# Patient Record
Sex: Female | Born: 1980 | Race: White | Hispanic: No | Smoking: Former smoker
Health system: Southern US, Community
[De-identification: ages and names within clinical notes are randomized; demographics above are authoritative.]

## PROBLEM LIST (undated history)

## (undated) ENCOUNTER — Inpatient Hospital Stay: Payer: Self-pay

## (undated) DIAGNOSIS — I10 Essential (primary) hypertension: Secondary | ICD-10-CM

## (undated) DIAGNOSIS — N200 Calculus of kidney: Secondary | ICD-10-CM

## (undated) DIAGNOSIS — I219 Acute myocardial infarction, unspecified: Secondary | ICD-10-CM

## (undated) DIAGNOSIS — I639 Cerebral infarction, unspecified: Secondary | ICD-10-CM

## (undated) DIAGNOSIS — N390 Urinary tract infection, site not specified: Secondary | ICD-10-CM

## (undated) HISTORY — PX: CARDIAC SURGERY: SHX584

## (undated) HISTORY — PX: APPENDECTOMY: SHX54

## (undated) HISTORY — PX: LITHOTRIPSY: SUR834

## (undated) SURGERY — Surgical Case
Anesthesia: Spinal | Laterality: Bilateral

---
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
## 2000-01-31 ENCOUNTER — Encounter: Payer: Self-pay | Admitting: Emergency Medicine

## 2000-01-31 ENCOUNTER — Emergency Department (HOSPITAL_COMMUNITY): Admission: EM | Admit: 2000-01-31 | Discharge: 2000-01-31 | Payer: Self-pay | Admitting: Emergency Medicine

## 2000-06-24 ENCOUNTER — Emergency Department (HOSPITAL_COMMUNITY): Admission: EM | Admit: 2000-06-24 | Discharge: 2000-06-24 | Payer: Self-pay | Admitting: Emergency Medicine

## 2000-12-04 ENCOUNTER — Emergency Department (HOSPITAL_COMMUNITY): Admission: EM | Admit: 2000-12-04 | Discharge: 2000-12-04 | Payer: Self-pay | Admitting: Emergency Medicine

## 2000-12-05 ENCOUNTER — Emergency Department (HOSPITAL_COMMUNITY): Admission: EM | Admit: 2000-12-05 | Discharge: 2000-12-05 | Payer: Self-pay | Admitting: Emergency Medicine

## 2001-11-13 ENCOUNTER — Emergency Department (HOSPITAL_COMMUNITY): Admission: EM | Admit: 2001-11-13 | Discharge: 2001-11-14 | Payer: Self-pay | Admitting: Emergency Medicine

## 2001-11-16 ENCOUNTER — Encounter: Payer: Self-pay | Admitting: Emergency Medicine

## 2001-11-16 ENCOUNTER — Inpatient Hospital Stay (HOSPITAL_COMMUNITY): Admission: EM | Admit: 2001-11-16 | Discharge: 2001-11-23 | Payer: Self-pay | Admitting: Emergency Medicine

## 2001-11-16 ENCOUNTER — Encounter (INDEPENDENT_AMBULATORY_CARE_PROVIDER_SITE_OTHER): Payer: Self-pay | Admitting: *Deleted

## 2001-11-22 ENCOUNTER — Encounter: Payer: Self-pay | Admitting: General Surgery

## 2002-01-30 ENCOUNTER — Emergency Department (HOSPITAL_COMMUNITY): Admission: EM | Admit: 2002-01-30 | Discharge: 2002-01-30 | Payer: Self-pay

## 2005-04-15 ENCOUNTER — Emergency Department: Payer: Self-pay | Admitting: Emergency Medicine

## 2005-09-29 ENCOUNTER — Emergency Department: Payer: Self-pay | Admitting: Emergency Medicine

## 2005-11-19 ENCOUNTER — Emergency Department: Payer: Self-pay | Admitting: Emergency Medicine

## 2006-03-08 ENCOUNTER — Emergency Department: Payer: Self-pay | Admitting: Emergency Medicine

## 2006-05-06 ENCOUNTER — Emergency Department: Payer: Self-pay | Admitting: Emergency Medicine

## 2006-06-17 ENCOUNTER — Emergency Department: Payer: Self-pay | Admitting: Emergency Medicine

## 2006-06-18 ENCOUNTER — Emergency Department: Payer: Self-pay | Admitting: Emergency Medicine

## 2006-06-30 ENCOUNTER — Other Ambulatory Visit: Payer: BC Managed Care – PPO

## 2006-06-30 ENCOUNTER — Emergency Department: Payer: Self-pay | Admitting: Emergency Medicine

## 2006-07-24 ENCOUNTER — Emergency Department: Payer: Self-pay | Admitting: Emergency Medicine

## 2006-09-23 ENCOUNTER — Emergency Department: Payer: Self-pay

## 2007-01-28 ENCOUNTER — Emergency Department: Payer: Self-pay | Admitting: Unknown Physician Specialty

## 2007-02-24 ENCOUNTER — Emergency Department: Payer: Self-pay | Admitting: General Practice

## 2008-01-16 ENCOUNTER — Emergency Department: Payer: Self-pay | Admitting: Emergency Medicine

## 2008-01-27 ENCOUNTER — Emergency Department (HOSPITAL_COMMUNITY): Admission: EM | Admit: 2008-01-27 | Discharge: 2008-01-27 | Payer: Self-pay | Admitting: Emergency Medicine

## 2008-07-21 ENCOUNTER — Emergency Department: Payer: Self-pay | Admitting: Emergency Medicine

## 2008-11-25 ENCOUNTER — Emergency Department: Payer: Self-pay | Admitting: Emergency Medicine

## 2008-12-08 ENCOUNTER — Emergency Department: Payer: Self-pay | Admitting: Emergency Medicine

## 2008-12-11 ENCOUNTER — Inpatient Hospital Stay: Payer: Self-pay | Admitting: Internal Medicine

## 2009-06-24 ENCOUNTER — Emergency Department: Payer: Self-pay | Admitting: Emergency Medicine

## 2009-07-30 ENCOUNTER — Emergency Department: Payer: Self-pay | Admitting: Unknown Physician Specialty

## 2009-09-16 ENCOUNTER — Emergency Department: Payer: Self-pay | Admitting: Emergency Medicine

## 2009-09-18 ENCOUNTER — Ambulatory Visit: Payer: Self-pay | Admitting: Urology

## 2009-09-22 ENCOUNTER — Emergency Department: Payer: Self-pay | Admitting: Emergency Medicine

## 2009-09-23 ENCOUNTER — Ambulatory Visit: Payer: Self-pay | Admitting: Urology

## 2009-09-24 ENCOUNTER — Ambulatory Visit: Payer: Self-pay | Admitting: Urology

## 2009-12-23 ENCOUNTER — Ambulatory Visit: Payer: Self-pay | Admitting: Urology

## 2009-12-29 ENCOUNTER — Emergency Department: Payer: Self-pay | Admitting: Emergency Medicine

## 2010-04-21 ENCOUNTER — Emergency Department: Payer: Self-pay | Admitting: Emergency Medicine

## 2010-06-29 ENCOUNTER — Emergency Department: Payer: Self-pay | Admitting: Emergency Medicine

## 2010-07-12 ENCOUNTER — Emergency Department: Payer: Self-pay | Admitting: Emergency Medicine

## 2010-10-16 ENCOUNTER — Emergency Department: Payer: Self-pay | Admitting: Emergency Medicine

## 2010-11-04 ENCOUNTER — Emergency Department: Payer: Self-pay | Admitting: Emergency Medicine

## 2011-03-09 ENCOUNTER — Emergency Department: Payer: Self-pay | Admitting: Internal Medicine

## 2011-03-13 ENCOUNTER — Emergency Department: Payer: Self-pay | Admitting: Emergency Medicine

## 2011-04-23 NOTE — H&P (Signed)
[place]. [facility]  Patient:    Brenda Todd, Brenda Todd Visit Number: [id number] MRN: [id number]          Service Type: SUR Location: [id number] Attending Physician:  Cherylynn Ridges Dictated by:   Jimmye Norman, M.D. Admit Date:  11/16/2001                           History and Physical  IDENTIFICATION AND CHIEF COMPLAINT:  The patient is a 30 year old female with a pelvic abscess likely secondary to ruptured appendix but possibly a tuboovarian abscess.  HISTORY OF PRESENT ILLNESS:  The patient has been ill since this past Sunday and actually started getting ill on Saturday evening with a generalized abdominal pain and localized to the right lower quadrant, associated with nausea.  She went for evaluation at an outside facility, where she sat in the lobby for eight hours and then went home without being seen.  She developed pain again the following day and actually stayed out of work for the entire week because of abdominal pain.  She has no primary care physician.  This evening, she came in with abdominal pain and generalized fever and white blood cell count which was elevated.  A CT scan demonstrated an abscess in the right lower quadrant consistent with acute appendicitis with rupture but also possibly secondary to a tuboovarian abscess.  Surgical consultation was obtained.  PAST MEDICAL HISTORY: She has no real past medical history.  PAST SURGICAL HISTORY:  She has had no previous surgery.  MEDICATIONS:  She takes no routine medications except for Aleve p.r.n.  ALLERGIES:  No known drug allergies.  REVIEW OF SYSTEMS:  She has had difficulty with diarrhea and constipation recently, possibly secondary to irritation.  No dysuria.  No weight loss.  PHYSICAL EXAMINATION:  VITAL SIGNS:  She is febrile to 101.8.  She is tachycardic at 105.  Blood pressure is okay.  HEENT:  She is normocephalic and atraumatic and anicteric.  NECK:   Supple.  CHEST:  Clear to auscultation.  CARDIAC:  Regular rhythm.  Rate is tachycardic at 107.  No murmurs, gallops, rubs or heaves.  ABDOMEN:  Her abdomen is tender throughout, especially in the right lower quadrant, but also diffusely tender in the left upper quadrant and left lower quadrant without a Rovsing sign.  RECTAL:  Deferred.  LABORATORY STUDIES:  Laboratory studies show an elevated white count. Hematocrit is okay.  IMPRESSION:  Likely ruptured acute appendicitis but possibly tuboovarian abscess with involvement of the periappendiceal area.  PLAN:  The plan is to take the patient to the operating room as soon as possible for a laparoscopic procedure, at which time we will be able to diagnose and treat either the ruptured appendix with abscess or the TOA. Dictated by:   Jimmye Norman, M.D. Attending Physician:  Cherylynn Ridges DD:  11/17/01 TD:  11/17/01 Job: [id number]

## 2011-04-23 NOTE — Discharge Summary (Signed)
[place]. [facility]  Patient:    Brenda Todd, Brenda Todd Visit Number: [id number] MRN: [id number]          Service Type: SUR Location: 6700 [id number] Attending Physician:  Cherylynn Ridges Dictated by:   Jimmye Norman, M.D. Admit Date:  11/16/2001 Discharge Date: 11/23/2001                             Discharge Summary  DISCHARGE DIAGNOSIS:  Ruptured appendicitis.  PRINCIPAL PROCEDURES: 1. CAT scan x2. 2. Laparoscopic appendectomy with drainage of pelvic abscess.  SURGEON:  Dr. Lindie Spruce.  DISCHARGE MEDICATIONS: 1. Ciprofloxacin 500 mg p.o. b.i.d. for five days. 2. Flagyl 250 mg p.o. t.i.d. for five days. 3. Vicodin for pain relief.  DIET:  Unrestricted.  CONDITION:  Stable.  ACTIVITY:  She has no restricted activity.  WOUND CARE:  She can shower and pat her wounds dry.  FOLLOWUP:  She will return to see me on December 05, 2001.  BRIEF SUMMARY OF HOSPITAL COURSE:  The patient came in with abdominal pain over several days.  She had gone to the emergency room to be seen for abdominal pain but over several hours had not been seen in the ED, went home, stayed there until she got sick with high fevers, rigors, and abdominal pain. She came in, where a CT scan demonstrated periappendiceal inflammation, likely periappendiceal rupture.  She was taken to surgery for a laparoscopic examination.  At the time of laparoscopy, the patient was found to have a necrotic gangrenous appendix, going down into the pelvis posterior to the right ovary. There was an abscessed cavity there.  We were able to laparoscopically remove the appendix and staple off the base.  We placed a Blake drain into the abscessed cavity on the right side after copious irrigation of that area with saline.  Once this was done, the patient was placed on IV antibiotics postoperatively including Cefotan, Flagyl, and gentamicin.  She was on once-a-day dosing of gentamicin.  Over the next four days,  she defervesced and was discharged to home on postop day #6 tolerating a regular diet well, having bowel movements, and with a CT scan demonstrating resolution of the pelvic abscess.  She will follow up to see me in a little bit over a week, maybe 12 days, and should call me for any difficulties prior to that. Dictated by:   Jimmye Norman, M.D. Attending Physician:  Cherylynn Ridges DD:  11/23/01 TD:  11/24/01 Job: [id number]

## 2011-04-23 NOTE — Op Note (Signed)
[place]. [facility]  Patient:    Brenda Todd, Brenda Todd Visit Number: [id number] MRN: [id number]          Service Type: SUR Location: 6700 [id number] Attending Physician:  Cherylynn Ridges Dictated by:   Jimmye Norman, M.D. Proc. Date: 11/17/01 Admit Date:  11/16/2001                             Operative Report  PREOPERATIVE DIAGNOSIS:  Ruptured appendix with retrocecal and right posterior pelvic abscess.  POSTOPERATIVE DIAGNOSIS:  Ruptured appendix with retrocecal and right posterior pelvic abscess.  OPERATION PERFORMED:  Laparoscopic appendectomy with drainage of pelvic abscess.  SURGEON:  Jimmye Norman, M.D.  ASSISTANT:  None  ANESTHESIA:  General endotracheal.  ESTIMATED BLOOD LOSS:  Less than 50 cc.  COMPLICATIONS:  None.  CONDITION:  Fair.  INDICATIONS FOR PROCEDURE:  The patient is a 30 year old female with a five day history of abdominal pain, fevers and leukocytosis, who now comes in for surgical treatment of her ruptured appendicitis, diagnosed by CT scan.  FINDINGS:  The patient had a large pelvic abscess, mostly in the right cul-de-sac.  This came from retrocecal and posterior appendix, which was necrotic and gangrenous and had perforated down towards the area of the abscess.  There were fecaliths associated with this perforated appendix which were removed along with the appendix itself.  DESCRIPTION OF PROCEDURE:  The patient was taken to the operating room and placed on the table in supine position.  After an adequate general anesthetic was administered, she was prepped in the usual sterile manner exposing all four quadrants of the abdomen.  A superumbilical longitudinal incision was made down to the midline fascia at ________ Veress needle was passed into to the peritoneal cavity tenting on the anterior abdominal wall with sharp towel clamps.  We confirmed position of the Veress needle using saline test and then insufflated CO2  into the peritoneal cavity up to a maximal intra-abdominal pressure of 15 mmHg.  Once this was done, a 10-11 mm cannula was passed through the superumbilical fascia into the peritoneal cavity and confirmed to be in position with a laparoscope and attached camera and light source.  Immediately upon passing the laparoscope into the peritoneal cavity through the superumbilical cannula you could see evidence of infection in the right lower quadrant.  There were loops of the terminal ileum sort of folding over towards the base of the cecum which upon lifting you could see the partially necrotic and infected appendix.  A right upper quadrant 5 mm cannula was passed under direct vision into the peritoneal cavity and then a superpubic 11-12 mm cannula without blade was passed into the peritoneal cavity.  Once we had all cannulas in place, the patient  was placed in Trendelenburg and the left side was tilted down. Initially, we bluntly dissected the ruptured appendix from the lateral posterior wall.  The wall of it was gangrenous and upon trying to grasp it there was pus extruding from its lumen.  We were able to mobilize the base of the appendix at the cecum and create a window between the mesoappendix and the base of the cecum.  However, upon trying to gently dissect it away, the appendix did avulse away from the base of the cecum.  However, there was still left a 2 to 3 cm stump.  We used this stump to control the base of the cecum as we passed  an Endo GIA through the 11-12 mm superpubic cannula into the peritoneal cavity, stapling across the base of the cecum without difficulty. This residual portion of cecum and appendix was passed off as a specimen.  We subsequently controlled the mesoappendix of the torn off appendix using a 2.5 mm Endo GIA stapler.  This allowed [place] to remove the appendix with an Endocatch bag through the superpubic cannula site without contamination of the skin.  The  rest of the case was then primarily controlling the bleeding and also breaking up the loculations of abscess cavity into the right posterior cul-de-sac.  We irrigated with close to 4.5L of saline.  There was an abscess cavity going behind the right fallopian tube and the right ovary.  Attempts to take pictures of this were made in the operating room.  However, because of a malfunction of the photographic equipment, no pictures could be taken.  The base of the cecum was inspected carefully to make sure that we had gotten the entire ____________ and this was easily noted.  The appendix epiploica or the fragile fatty tissue at the terminal ileum was used to lift up the base of the cecum, look at the mesoappendix where there was some bleeding from the staple lines.  This was controlled with electrocautery.  Once the bleeding at the mesoappendix was controlled, the base of the cecum was inspected.  We irrigated with copious amounts of saline in this posterior abscess cavity.  A 19 mm Blake was cut to the appropriate length and then passed down through the superpubic cannula into the peritoneal cavity and then brought out through the right upper quadrant incision site.  We used the second grasper to pass the [place] drain posterior to the right fallopian tube and ovary at the site of the loculated cavity.  We secured it in place with 3-0 nylon.  Once the catheter or the [facility] drain was in place, we removed our cannulas and then repaired the fascia.  The superumbilical fascia was closed using a figure-of-eight stitch of #0 Vicryl.  Once this was done, the skin was closed using running subcuticular stitch of 5-0 Vicryl.  This was done at the superumbilical and also at the pelvic site.  The drain came out the last cannula site in the right upper quadrant and had a drain dressing put in place.  Sponge, needle and instrument counts were correct.  Sterile dressings were applied. Dictated by:   Jimmye Norman, M.D. Attending Physician:  Cherylynn Ridges DD:  11/17/01 TD:  11/17/01 Job: [id number]

## 2011-08-30 LAB — URINE MICROSCOPIC-ADD ON

## 2011-08-30 LAB — URINALYSIS, ROUTINE W REFLEX MICROSCOPIC
Glucose, UA: NEGATIVE
Ketones, ur: NEGATIVE
Specific Gravity, Urine: 1.021
pH: 7.5

## 2011-08-30 LAB — BASIC METABOLIC PANEL
CO2: 27
Chloride: 104
GFR calc Af Amer: >60
Potassium: 4
Sodium: 139

## 2011-08-30 LAB — CBC
Hemoglobin: 14.6
MCHC: 34.5
MCV: 80
RBC: 5.31 — ABNORMAL HIGH
WBC: 10.7 — ABNORMAL HIGH

## 2011-08-30 LAB — DIFFERENTIAL
Basophils Relative: 1
Eosinophils Absolute: 0.2
Lymphs Abs: 2.8
Monocytes Absolute: 0.5
Monocytes Relative: 5

## 2011-09-16 ENCOUNTER — Emergency Department: Payer: Self-pay | Admitting: *Deleted

## 2012-04-05 ENCOUNTER — Encounter (HOSPITAL_COMMUNITY): Payer: Self-pay | Admitting: Emergency Medicine

## 2012-04-05 ENCOUNTER — Emergency Department (HOSPITAL_COMMUNITY): Payer: BC Managed Care – PPO

## 2012-04-05 ENCOUNTER — Emergency Department (HOSPITAL_COMMUNITY)
Admission: EM | Admit: 2012-04-05 | Discharge: 2012-04-05 | Disposition: A | Discharge status: Home / Self Care | Payer: BC Managed Care – PPO | Attending: Emergency Medicine | Admitting: Emergency Medicine

## 2012-04-05 DIAGNOSIS — R300 Dysuria: Secondary | ICD-10-CM | POA: Insufficient documentation

## 2012-04-05 DIAGNOSIS — R350 Frequency of micturition: Secondary | ICD-10-CM | POA: Insufficient documentation

## 2012-04-05 DIAGNOSIS — N10 Acute tubulo-interstitial nephritis: Secondary | ICD-10-CM | POA: Insufficient documentation

## 2012-04-05 DIAGNOSIS — R3915 Urgency of urination: Secondary | ICD-10-CM | POA: Insufficient documentation

## 2012-04-05 DIAGNOSIS — Z79899 Other long term (current) drug therapy: Secondary | ICD-10-CM | POA: Insufficient documentation

## 2012-04-05 DIAGNOSIS — R109 Unspecified abdominal pain: Secondary | ICD-10-CM | POA: Insufficient documentation

## 2012-04-05 DIAGNOSIS — R509 Fever, unspecified: Secondary | ICD-10-CM | POA: Insufficient documentation

## 2012-04-05 DIAGNOSIS — R112 Nausea with vomiting, unspecified: Secondary | ICD-10-CM | POA: Insufficient documentation

## 2012-04-05 DIAGNOSIS — M549 Dorsalgia, unspecified: Secondary | ICD-10-CM | POA: Insufficient documentation

## 2012-04-05 HISTORY — DX: Calculus of kidney: N20.0

## 2012-04-05 HISTORY — DX: Urinary tract infection, site not specified: N39.0

## 2012-04-05 LAB — COMPREHENSIVE METABOLIC PANEL
BUN: 13 mg/dL (ref 6–23)
CO2: 25 mEq/L (ref 19–32)
Calcium: 9 mg/dL (ref 8.4–10.5)
Creatinine, Ser: 0.89 mg/dL (ref 0.50–1.10)
GFR calc Af Amer: >90 mL/min (ref >90)
GFR calc non Af Amer: 85 mL/min — ABNORMAL LOW (ref >90)
Glucose, Bld: 100 mg/dL — ABNORMAL HIGH (ref 70–99)
Sodium: 138 mEq/L (ref 135–145)
Total Protein: 7.1 g/dL (ref 6.0–8.3)

## 2012-04-05 LAB — CBC
Hemoglobin: 13.8 g/dL (ref 12.0–15.0)
MCH: 27.8 pg (ref 26.0–34.0)
MCHC: 34 g/dL (ref 30.0–36.0)
MCV: 81.9 fL (ref 78.0–100.0)
Platelets: 291 10*3/uL (ref 150–400)

## 2012-04-05 LAB — URINE MICROSCOPIC-ADD ON

## 2012-04-05 LAB — POCT PREGNANCY, URINE: Preg Test, Ur: NEGATIVE

## 2012-04-05 LAB — DIFFERENTIAL
Basophils Relative: 1 % (ref 0–1)
Eosinophils Absolute: 0.2 10*3/uL (ref 0.0–0.7)
Eosinophils Relative: 1 % (ref 0–5)
Lymphs Abs: 3.2 10*3/uL (ref 0.7–4.0)
Monocytes Relative: 6 % (ref 3–12)
Neutrophils Relative %: 64 % (ref 43–77)

## 2012-04-05 LAB — URINALYSIS, ROUTINE W REFLEX MICROSCOPIC
Glucose, UA: NEGATIVE mg/dL
Protein, ur: 100 mg/dL — AB
Urobilinogen, UA: 1 mg/dL (ref 0.0–1.0)

## 2012-04-05 MED ORDER — NAPROXEN 500 MG PO TABS: 500.0000 mg | ORAL_TABLET | Freq: Two times a day (BID) | ORAL | Status: AC

## 2012-04-05 MED ORDER — CIPROFLOXACIN HCL 500 MG PO TABS: 500.0000 mg | ORAL_TABLET | Freq: Two times a day (BID) | ORAL | Status: AC

## 2012-04-05 MED ORDER — HYDROMORPHONE HCL PF 1 MG/ML IJ SOLN
1.0000 mg | Freq: Once | INTRAMUSCULAR | Status: AC
  Administered 2012-04-05: 1 mg via INTRAVENOUS
  Filled 2012-04-05: qty 1

## 2012-04-05 MED ORDER — ONDANSETRON HCL 4 MG PO TABS: 4.0000 mg | ORAL_TABLET | Freq: Four times a day (QID) | ORAL | Status: AC

## 2012-04-05 MED ORDER — DEXTROSE 5 % IV SOLN
1.0000 g | Freq: Once | INTRAVENOUS | Status: AC
  Administered 2012-04-05: 1 g via INTRAVENOUS
  Filled 2012-04-05: qty 10

## 2012-04-05 MED ORDER — OXYCODONE-ACETAMINOPHEN 5-325 MG PO TABS: 1.0000 | ORAL_TABLET | Freq: Four times a day (QID) | ORAL | Status: AC | PRN

## 2012-04-05 MED ORDER — SODIUM CHLORIDE 0.9 % IV BOLUS (SEPSIS)
1000.0000 mL | Freq: Once | INTRAVENOUS | Status: AC
  Administered 2012-04-05: 1000 mL via INTRAVENOUS

## 2012-04-05 MED ORDER — ONDANSETRON HCL 4 MG/2ML IJ SOLN
4.0000 mg | Freq: Once | INTRAMUSCULAR | Status: AC
  Administered 2012-04-05: 4 mg via INTRAVENOUS
  Filled 2012-04-05: qty 2

## 2012-04-05 MED ORDER — KETOROLAC TROMETHAMINE 30 MG/ML IJ SOLN
30.0000 mg | Freq: Once | INTRAMUSCULAR | Status: AC
  Administered 2012-04-05: 30 mg via INTRAVENOUS
  Filled 2012-04-05: qty 1

## 2012-04-05 NOTE — Discharge Instructions (Signed)
Pyelonephritis, Adult Pyelonephritis is a kidney infection. In general, there are 2 main types of pyelonephritis:  Infections that come on quickly without any warning (acute pyelonephritis).   Infections that persist for a long period of time (chronic pyelonephritis).  CAUSES  Two main causes of pyelonephritis are:  Bacteria traveling from the bladder to the kidney. This is a problem especially in pregnant women. The urine in the bladder can become filled with bacteria from multiple causes, including:   Inflammation of the prostate gland (prostatitis).   Sexual intercourse in females.   Bladder infection (cystitis).   Bacteria traveling from the bloodstream to the tissue part of the kidney.  Problems that may increase your risk of getting a kidney infection include:  Diabetes.   Kidney stones or bladder stones.   Cancer.   Catheters placed in the bladder.   Other abnormalities of the kidney or ureter.  SYMPTOMS   Abdominal pain.   Pain in the side or flank area.   Fever.   Chills.   Upset stomach.   Blood in the urine (dark urine).   Frequent urination.   Strong or persistent urge to urinate.   Burning or stinging when urinating.  DIAGNOSIS  Your caregiver may diagnose your kidney infection based on your symptoms. A urine sample may also be taken. TREATMENT  In general, treatment depends on how severe the infection is.   If the infection is mild and caught early, your caregiver may treat you with oral antibiotics and send you home.   If the infection is more severe, the bacteria may have gotten into the bloodstream. This will require intravenous (IV) antibiotics and a hospital stay. Symptoms may include:   High fever.   Severe flank pain.   Shaking chills.   Even after a hospital stay, your caregiver may require you to be on oral antibiotics for a period of time.   Other treatments may be required depending upon the cause of the infection.  HOME CARE  INSTRUCTIONS   Take your antibiotics as directed. Finish them even if you start to feel better.   Make an appointment to have your urine checked to make sure the infection is gone.   Drink enough fluids to keep your urine clear or pale yellow.   Take medicines for the bladder if you have urgency and frequency of urination as directed by your caregiver.  SEEK IMMEDIATE MEDICAL CARE IF:   You have a fever.   You are unable to take your antibiotics or fluids.   You develop shaking chills.   You experience extreme weakness or fainting.   There is no improvement after 2 days of treatment.  MAKE SURE YOU:  Understand these instructions.   Will watch your condition.   Will get help right away if you are not doing well or get worse.  Document Released: 11/22/2005 Document Revised: 11/11/2011 Document Reviewed: 04/28/2011 Exit[employer] Patient Information 2012 [employer]. 

## 2012-04-05 NOTE — ED Notes (Signed)
Pt reports flank pain L sided since Sun pm, has thrown up daily since then X Sunday.Pt has hx kidney stones and UTI.

## 2012-04-05 NOTE — ED Notes (Signed)
IV DC'd and inst and meds explained and pt DC'd taken to window for DC

## 2012-04-05 NOTE — ED Provider Notes (Signed)
History     CSN: [id number]  Arrival date & time 04/05/12  1721   First MD Initiated Contact with Patient 04/05/12 1747      Chief Complaint  Patient presents with  . Flank Pain    (Consider location/radiation/quality/duration/timing/severity/associated sxs/prior treatment) HPI Comments: Left flank pain with nausea and vomiting. Has also had a fever of 101. States she was diagnosed with a kidney infection and also has a history of kidney stones. Concerned she has an infected stone. Her urologist is out of town. Has been taking Cipro for 2 days  Patient is a 31 y.o. female presenting with flank pain. The history is provided by the patient. No language interpreter was used.  Flank Pain This is a new problem. The current episode started 2 days ago. The problem occurs constantly. The problem has been gradually worsening. Associated symptoms include abdominal pain. Pertinent negatives include no chest pain, no headaches and no shortness of breath. The symptoms are aggravated by nothing. The symptoms are relieved by nothing. Treatments tried: lortab. The treatment provided mild relief.    Past Medical History  Diagnosis Date  . Kidney calculi   . UTI (lower urinary tract infection)     Past Surgical History  Procedure Date  . Appendectomy     No family history on file.  History  Substance Use Topics  . Smoking status: Current Everyday Smoker  . Smokeless tobacco: Not on file  . Alcohol Use: No    OB History    Grav Para Term Preterm Abortions TAB SAB Ect Mult Living                  Review of Systems  Constitutional: Positive for fever, activity change and appetite change. Negative for chills and fatigue.  HENT: Negative for congestion, sore throat, rhinorrhea, neck pain and neck stiffness.   Respiratory: Negative for cough and shortness of breath.   Cardiovascular: Negative for chest pain and palpitations.  Gastrointestinal: Positive for nausea, vomiting and abdominal  pain.  Genitourinary: Positive for dysuria, urgency, frequency and flank pain. Negative for vaginal bleeding and vaginal discharge.  Musculoskeletal: Positive for back pain. Negative for myalgias and arthralgias.  Neurological: Negative for dizziness, weakness, light-headedness, numbness and headaches.  All other systems reviewed and are negative.    Allergies  Penicillins  Home Medications   Current Outpatient Rx  Name Route Sig Dispense Refill  . CIPROFLOXACIN HCL 500 MG PO TABS Oral Take 500 mg by mouth 2 (two) times daily.    Marland Kitchen HYDROCODONE-ACETAMINOPHEN 10-500 MG PO TABS Oral Take 1 tablet by mouth every 6 (six) hours as needed. Pain    . PROMETHAZINE HCL 25 MG PO TABS Oral Take 25 mg by mouth every 6 (six) hours as needed. Nausea    . CIPROFLOXACIN HCL 500 MG PO TABS Oral Take 1 tablet (500 mg total) by mouth 2 (two) times daily. 28 tablet 0  . NAPROXEN 500 MG PO TABS Oral Take 1 tablet (500 mg total) by mouth 2 (two) times daily. 30 tablet 0  . ONDANSETRON HCL 4 MG PO TABS Oral Take 1 tablet (4 mg total) by mouth every 6 (six) hours. 12 tablet 0  . OXYCODONE-ACETAMINOPHEN 5-325 MG PO TABS Oral Take 1-2 tablets by mouth every 6 (six) hours as needed for pain. 12 tablet 0    BP 145/93  Pulse 85  Temp(Src) 98.1 F (36.7 C) (Oral)  Resp 20  SpO2 98%  LMP 03/22/2012  Physical Exam  Nursing note and vitals reviewed. Constitutional: She is oriented to person, place, and time. She appears well-developed and well-nourished. No distress.  HENT:  Head: Normocephalic and atraumatic.  Mouth/Throat: Oropharynx is clear and moist. No oropharyngeal exudate.  Eyes: Conjunctivae and EOM are normal. Pupils are equal, round, and reactive to light.  Neck: Normal range of motion. Neck supple.  Cardiovascular: Normal rate, regular rhythm, normal heart sounds and intact distal pulses.  Exam reveals no gallop and no friction rub.   No murmur heard. Pulmonary/Chest: Effort normal and breath  sounds normal. No respiratory distress. She exhibits no tenderness.  Abdominal: Soft. Bowel sounds are normal. There is tenderness (L sided abdominal pain with L CVA tenderness). There is no rebound and no guarding.  Musculoskeletal: Normal range of motion. She exhibits no edema and no tenderness.  Neurological: She is alert and oriented to person, place, and time. No cranial nerve deficit.  Skin: Skin is warm and dry. No rash noted.    ED Course  Procedures (including critical care time)  Labs Reviewed  CBC - Abnormal; Notable for the following:    WBC 11.1 (*)    All other components within normal limits  URINALYSIS, ROUTINE W REFLEX MICROSCOPIC - Abnormal; Notable for the following:    Color, Urine RED (*) BIOCHEMICALS MAY BE AFFECTED BY COLOR   APPearance TURBID (*)    Hgb urine dipstick LARGE (*)    Ketones, ur TRACE (*)    Protein, ur 100 (*)    Nitrite POSITIVE (*)    Leukocytes, UA LARGE (*)    All other components within normal limits  COMPREHENSIVE METABOLIC PANEL - Abnormal; Notable for the following:    Glucose, Bld 100 (*)    Total Bilirubin 0.1 (*)    GFR calc non Af Amer 85 (*)    All other components within normal limits  URINE MICROSCOPIC-ADD ON - Abnormal; Notable for the following:    Squamous Epithelial / LPF MANY (*)    Bacteria, UA FEW (*)    All other components within normal limits  DIFFERENTIAL  POCT PREGNANCY, URINE  URINE CULTURE   Ct Abdomen Pelvis Wo Contrast  04/05/2012  *RADIOLOGY REPORT*  Clinical Data: Left flank pain, rule out stones  CT ABDOMEN AND PELVIS WITHOUT CONTRAST  Technique:  Multidetector CT imaging of the abdomen and pelvis was performed following the standard protocol without intravenous contrast.  Comparison: 01/27/2008  Findings: Lung bases are unremarkable.  Sagittal images of the spine are unremarkable.  Unenhanced liver, spleen, pancreas and adrenal glands are unremarkable.  Gallbladder is contracted without evidence of calcified  gallstones.  There is a nonobstructive calcification in the lower pole of the left kidney measures 2 mm.  No hydronephrosis or hydroureter.  Bilateral no calcified ureteral calculi are noted.  No aortic aneurysm.  No small bowel obstruction.  No ascites or free air.  No adenopathy.  Stool noted in the right colon and cecum.  The patient is status post appendectomy.  Bilateral distal ureter is unremarkable.  Unenhanced uterus and adnexa are unremarkable.  No pelvic ascites or adenopathy.  No destructive bony lesions are noted within pelvis.  IMPRESSION:  1.  There is  left nonobstructive nephrolithiasis.  No hydronephrosis or hydroureter. 2.  No calcified ureteral calculi are noted. 3.  Status post appendectomy.  Original Report Authenticated By: Natasha Mead, M.D.     1. Acute pyelonephritis       MDM  Acute pyelonephritis. She has a history  of kidney stones therefore a CT was performed to rule out an infected stone. She has no hydronephrosis but she does have some nonobstructive nephrolithiasis. I do not feel that this is the cause for pain. Entirely secondary nephritis. She was given a dose of Rocephin and will be discharged home on ciprofloxacin for 2 weeks, pain medication, antiemetics. Her to followup with her primary care physician. Provided straight return precautions        Dayton Bailiff, MD 04/05/12 2054

## 2012-04-05 NOTE — ED Notes (Signed)
Pt reports dysuria since Sunday, states her urine has been red since Saturday. Pt states she has been straining her urine since Sunday and has not noticed any stones. Pt states sometimes it is difficult to start flow of urine.

## 2012-04-07 LAB — URINE CULTURE
Colony Count: >=100000
Culture  Setup Time: 201305020147

## 2012-07-02 ENCOUNTER — Emergency Department: Payer: Self-pay | Admitting: Emergency Medicine

## 2012-07-02 LAB — CBC WITH DIFFERENTIAL/PLATELET
Basophil #: 0.1 10*3/uL (ref 0.0–0.1)
Eosinophil #: 0.2 10*3/uL (ref 0.0–0.7)
HCT: 40.8 % (ref 35.0–47.0)
Lymphocyte %: 28 %
MCHC: 33.6 g/dL (ref 32.0–36.0)
MCV: 82 fL (ref 80–100)
Monocyte #: 0.5 x10 3/mm (ref 0.2–0.9)
Monocyte %: 6.4 %
Neutrophil #: 5.1 10*3/uL (ref 1.4–6.5)
Neutrophil %: 61.5 %
Platelet: 278 10*3/uL (ref 150–440)
RDW: 14.1 % (ref 11.5–14.5)
WBC: 8.2 10*3/uL (ref 3.6–11.0)

## 2012-07-02 LAB — URINALYSIS, COMPLETE
Bilirubin,UR: NEGATIVE
Ketone: NEGATIVE
Nitrite: NEGATIVE
Ph: 6 (ref 4.5–8.0)
RBC,UR: 178 /HPF (ref 0–5)
Squamous Epithelial: 14

## 2012-07-02 LAB — BASIC METABOLIC PANEL
Anion Gap: 5 — ABNORMAL LOW (ref 7–16)
BUN: 11 mg/dL (ref 7–18)
Co2: 31 mmol/L (ref 21–32)
Creatinine: 0.97 mg/dL (ref 0.60–1.30)
EGFR (African American): >60
Potassium: 3.6 mmol/L (ref 3.5–5.1)

## 2012-09-04 ENCOUNTER — Emergency Department: Payer: Self-pay

## 2012-09-04 LAB — URINALYSIS, COMPLETE
Bilirubin,UR: NEGATIVE
Ketone: NEGATIVE
Ph: 5 (ref 4.5–8.0)
Protein: 30
RBC,UR: 912 /HPF (ref 0–5)
Specific Gravity: 1.018 (ref 1.003–1.030)
Squamous Epithelial: 56

## 2012-09-04 LAB — CBC
HCT: 42.6 % (ref 35.0–47.0)
HGB: 14.3 g/dL (ref 12.0–16.0)
MCH: 27.8 pg (ref 26.0–34.0)
MCHC: 33.5 g/dL (ref 32.0–36.0)
WBC: 11.2 10*3/uL — ABNORMAL HIGH (ref 3.6–11.0)

## 2012-09-04 LAB — COMPREHENSIVE METABOLIC PANEL
Bilirubin,Total: 0.2 mg/dL (ref 0.2–1.0)
Calcium, Total: 8.7 mg/dL (ref 8.5–10.1)
Chloride: 109 mmol/L — ABNORMAL HIGH (ref 98–107)
Co2: 24 mmol/L (ref 21–32)
Creatinine: 0.79 mg/dL (ref 0.60–1.30)
EGFR (African American): >60
EGFR (Non-African Amer.): >60
Osmolality: 283 (ref 275–301)
SGPT (ALT): 26 U/L (ref 12–78)
Total Protein: 7.6 g/dL (ref 6.4–8.2)

## 2012-09-04 LAB — PREGNANCY, URINE: Pregnancy Test, Urine: NEGATIVE m[IU]/mL

## 2012-09-06 ENCOUNTER — Emergency Department: Payer: Self-pay | Admitting: Emergency Medicine

## 2012-09-06 LAB — URINALYSIS, COMPLETE
Glucose,UR: NEGATIVE mg/dL (ref 0–75)
Protein: 30
RBC,UR: 526 /HPF (ref 0–5)
Squamous Epithelial: 16
WBC UR: 83 /HPF (ref 0–5)

## 2012-09-06 LAB — CBC
HCT: 39.6 % (ref 35.0–47.0)
HGB: 13.2 g/dL (ref 12.0–16.0)
MCH: 27.2 pg (ref 26.0–34.0)
MCV: 82 fL (ref 80–100)
Platelet: 243 10*3/uL (ref 150–440)
WBC: 14.7 10*3/uL — ABNORMAL HIGH (ref 3.6–11.0)

## 2012-09-06 LAB — BASIC METABOLIC PANEL
Anion Gap: 7 (ref 7–16)
Chloride: 106 mmol/L (ref 98–107)
Co2: 27 mmol/L (ref 21–32)
Creatinine: 1.03 mg/dL (ref 0.60–1.30)
EGFR (Non-African Amer.): >60
Osmolality: 279 (ref 275–301)
Potassium: 3.7 mmol/L (ref 3.5–5.1)

## 2012-11-22 ENCOUNTER — Emergency Department: Payer: Self-pay

## 2012-11-22 LAB — CBC
HCT: 42.9 % (ref 35.0–47.0)
MCH: 28.2 pg (ref 26.0–34.0)
MCHC: 34.1 g/dL (ref 32.0–36.0)
MCV: 83 fL (ref 80–100)
Platelet: 266 10*3/uL (ref 150–440)
RBC: 5.18 10*6/uL (ref 3.80–5.20)
WBC: 9.6 10*3/uL (ref 3.6–11.0)

## 2012-11-22 LAB — COMPREHENSIVE METABOLIC PANEL
Albumin: 3.9 g/dL (ref 3.4–5.0)
Alkaline Phosphatase: 78 U/L (ref 50–136)
BUN: 9 mg/dL (ref 7–18)
Bilirubin,Total: 0.3 mg/dL (ref 0.2–1.0)
Calcium, Total: 8.7 mg/dL (ref 8.5–10.1)
Co2: 24 mmol/L (ref 21–32)
Creatinine: 0.77 mg/dL (ref 0.60–1.30)
EGFR (Non-African Amer.): >60
Glucose: 97 mg/dL (ref 65–99)
Potassium: 4.3 mmol/L (ref 3.5–5.1)
SGOT(AST): 18 U/L (ref 15–37)
SGPT (ALT): 20 U/L (ref 12–78)
Total Protein: 7.9 g/dL (ref 6.4–8.2)

## 2012-11-22 LAB — URINALYSIS, COMPLETE
Bilirubin,UR: NEGATIVE
Nitrite: NEGATIVE
Ph: 5 (ref 4.5–8.0)
Protein: 30
RBC,UR: 191 /HPF (ref 0–5)
Specific Gravity: 1.018 (ref 1.003–1.030)
Squamous Epithelial: 24

## 2013-02-27 ENCOUNTER — Emergency Department: Payer: Self-pay | Admitting: Emergency Medicine

## 2013-02-27 LAB — URINALYSIS, COMPLETE
Bilirubin,UR: NEGATIVE
Glucose,UR: NEGATIVE mg/dL (ref 0–75)
Ph: 8 (ref 4.5–8.0)
RBC,UR: 74 /HPF (ref 0–5)
Specific Gravity: 1.012 (ref 1.003–1.030)
Squamous Epithelial: 19

## 2013-02-27 LAB — BASIC METABOLIC PANEL
Anion Gap: 4 — ABNORMAL LOW (ref 7–16)
BUN: 11 mg/dL (ref 7–18)
Calcium, Total: 8.5 mg/dL (ref 8.5–10.1)
Chloride: 107 mmol/L (ref 98–107)
Co2: 27 mmol/L (ref 21–32)
Creatinine: 0.78 mg/dL (ref 0.60–1.30)
EGFR (African American): >60
EGFR (Non-African Amer.): >60
Osmolality: 275 (ref 275–301)

## 2013-02-27 LAB — CBC
HCT: 42.1 % (ref 35.0–47.0)
HGB: 13.7 g/dL (ref 12.0–16.0)
MCHC: 32.5 g/dL (ref 32.0–36.0)
Platelet: 278 10*3/uL (ref 150–440)
RBC: 5.08 10*6/uL (ref 3.80–5.20)
RDW: 14.3 % (ref 11.5–14.5)
WBC: 11.6 10*3/uL — ABNORMAL HIGH (ref 3.6–11.0)

## 2013-07-04 ENCOUNTER — Emergency Department: Payer: Self-pay | Admitting: Emergency Medicine

## 2013-08-09 ENCOUNTER — Emergency Department: Payer: Self-pay | Admitting: Emergency Medicine

## 2013-08-09 LAB — HEPATIC FUNCTION PANEL A (ARMC)
Alkaline Phosphatase: 67 U/L (ref 50–136)
Bilirubin, Direct: <0.1 mg/dL (ref 0.00–0.20)
Bilirubin,Total: 0.3 mg/dL (ref 0.2–1.0)
SGOT(AST): 16 U/L (ref 15–37)
SGPT (ALT): 23 U/L (ref 12–78)

## 2013-08-09 LAB — BASIC METABOLIC PANEL
Anion Gap: 4 — ABNORMAL LOW (ref 7–16)
Chloride: 107 mmol/L (ref 98–107)
Co2: 27 mmol/L (ref 21–32)
Creatinine: 0.82 mg/dL (ref 0.60–1.30)
EGFR (Non-African Amer.): >60
Glucose: 84 mg/dL (ref 65–99)
Osmolality: 274 (ref 275–301)
Potassium: 3.8 mmol/L (ref 3.5–5.1)
Sodium: 138 mmol/L (ref 136–145)

## 2013-08-09 LAB — CBC WITH DIFFERENTIAL/PLATELET
Basophil %: 0.8 %
Eosinophil #: 0.2 10*3/uL (ref 0.0–0.7)
HCT: 41.3 % (ref 35.0–47.0)
Lymphocyte #: 4.2 10*3/uL — ABNORMAL HIGH (ref 1.0–3.6)
MCH: 27.4 pg (ref 26.0–34.0)
MCHC: 33.8 g/dL (ref 32.0–36.0)
MCV: 81 fL (ref 80–100)
Monocyte #: 0.7 x10 3/mm (ref 0.2–0.9)
Monocyte %: 5.7 %
Neutrophil #: 7.3 10*3/uL — ABNORMAL HIGH (ref 1.4–6.5)
Neutrophil %: 58.4 %
RDW: 14.6 % — ABNORMAL HIGH (ref 11.5–14.5)
WBC: 12.4 10*3/uL — ABNORMAL HIGH (ref 3.6–11.0)

## 2013-08-09 LAB — URINALYSIS, COMPLETE
Bilirubin,UR: NEGATIVE
Ketone: NEGATIVE
Protein: 30
Squamous Epithelial: 13
WBC UR: 9 /HPF (ref 0–5)

## 2013-08-09 LAB — LIPASE, BLOOD: Lipase: 119 U/L (ref 73–393)

## 2013-08-12 LAB — URINE CULTURE

## 2013-10-21 ENCOUNTER — Emergency Department: Payer: Self-pay | Admitting: Emergency Medicine

## 2013-10-21 LAB — CBC
MCH: 27.4 pg (ref 26.0–34.0)
MCHC: 33.6 g/dL (ref 32.0–36.0)
MCV: 82 fL (ref 80–100)
Platelet: 258 10*3/uL (ref 150–440)
RBC: 5.28 10*6/uL — ABNORMAL HIGH (ref 3.80–5.20)
RDW: 14.4 % (ref 11.5–14.5)

## 2013-10-21 LAB — URINALYSIS, COMPLETE
Glucose,UR: NEGATIVE mg/dL (ref 0–75)
Ketone: NEGATIVE
Protein: 30
RBC,UR: 80 /HPF (ref 0–5)
Specific Gravity: 1.013 (ref 1.003–1.030)
Squamous Epithelial: 20

## 2013-10-21 LAB — BASIC METABOLIC PANEL
Anion Gap: 8 (ref 7–16)
Chloride: 107 mmol/L (ref 98–107)
Co2: 25 mmol/L (ref 21–32)
EGFR (Non-African Amer.): >60
Osmolality: 278 (ref 275–301)
Potassium: 3.9 mmol/L (ref 3.5–5.1)
Sodium: 140 mmol/L (ref 136–145)

## 2013-12-21 ENCOUNTER — Emergency Department: Payer: Self-pay | Admitting: Emergency Medicine

## 2013-12-21 LAB — URINALYSIS, COMPLETE
BILIRUBIN, UR: NEGATIVE
Glucose,UR: NEGATIVE mg/dL (ref 0–75)
Ketone: NEGATIVE
NITRITE: NEGATIVE
Ph: 7 (ref 4.5–8.0)
Protein: 30
RBC,UR: 18 /HPF (ref 0–5)
SPECIFIC GRAVITY: 1.017 (ref 1.003–1.030)
Squamous Epithelial: 16

## 2013-12-21 LAB — COMPREHENSIVE METABOLIC PANEL
Albumin: 3.9 g/dL (ref 3.4–5.0)
Alkaline Phosphatase: 77 U/L
Anion Gap: 4 — ABNORMAL LOW (ref 7–16)
BUN: 9 mg/dL (ref 7–18)
Bilirubin,Total: 0.2 mg/dL (ref 0.2–1.0)
CHLORIDE: 105 mmol/L (ref 98–107)
Calcium, Total: 9 mg/dL (ref 8.5–10.1)
Co2: 28 mmol/L (ref 21–32)
Creatinine: 0.94 mg/dL (ref 0.60–1.30)
EGFR (African American): >60
Glucose: 110 mg/dL — ABNORMAL HIGH (ref 65–99)
OSMOLALITY: 273 (ref 275–301)
Potassium: 3.5 mmol/L (ref 3.5–5.1)
SGOT(AST): 18 U/L (ref 15–37)
SGPT (ALT): 25 U/L (ref 12–78)
SODIUM: 137 mmol/L (ref 136–145)
Total Protein: 8.1 g/dL (ref 6.4–8.2)

## 2013-12-21 LAB — CBC
HCT: 45 % (ref 35.0–47.0)
HGB: 14.9 g/dL (ref 12.0–16.0)
MCV: 82 fL (ref 80–100)
Platelet: 285 10*3/uL (ref 150–440)
RBC: 5.52 10*6/uL — ABNORMAL HIGH (ref 3.80–5.20)
WBC: 10.4 10*3/uL (ref 3.6–11.0)

## 2013-12-21 LAB — LIPASE, BLOOD: Lipase: 131 U/L (ref 73–393)

## 2013-12-23 LAB — URINE CULTURE

## 2014-03-27 ENCOUNTER — Emergency Department: Payer: Self-pay

## 2014-03-27 LAB — COMPREHENSIVE METABOLIC PANEL
ALBUMIN: 4 g/dL (ref 3.4–5.0)
Alkaline Phosphatase: 65 U/L
Anion Gap: 6 — ABNORMAL LOW (ref 7–16)
BILIRUBIN TOTAL: 0.6 mg/dL (ref 0.2–1.0)
BUN: 11 mg/dL (ref 7–18)
CALCIUM: 9.3 mg/dL (ref 8.5–10.1)
CO2: 25 mmol/L (ref 21–32)
Chloride: 107 mmol/L (ref 98–107)
Creatinine: 0.92 mg/dL (ref 0.60–1.30)
EGFR (Non-African Amer.): >60
GLUCOSE: 99 mg/dL (ref 65–99)
Osmolality: 275 (ref 275–301)
Potassium: 3.5 mmol/L (ref 3.5–5.1)
SGOT(AST): 12 U/L — ABNORMAL LOW (ref 15–37)
SGPT (ALT): 17 U/L (ref 12–78)
SODIUM: 138 mmol/L (ref 136–145)
Total Protein: 7.3 g/dL (ref 6.4–8.2)

## 2014-03-27 LAB — URINALYSIS, COMPLETE
BACTERIA: NONE SEEN
Bilirubin,UR: NEGATIVE
GLUCOSE, UR: NEGATIVE mg/dL (ref 0–75)
Glucose,UR: NEGATIVE mg/dL (ref 0–75)
Hyaline Cast: 1
Leukocyte Esterase: NEGATIVE
Leukocyte Esterase: NEGATIVE
Nitrite: NEGATIVE
Nitrite: NEGATIVE
PROTEIN: NEGATIVE
Ph: 7 (ref 4.5–8.0)
Ph: 5 (ref 4.5–8.0)
Protein: NEGATIVE
RBC,UR: NONE SEEN /HPF (ref 0–5)
SPECIFIC GRAVITY: 1.012 (ref 1.003–1.030)
SPECIFIC GRAVITY: 1.021 (ref 1.003–1.030)
SQUAMOUS EPITHELIAL: NONE SEEN
Squamous Epithelial: 20
WBC UR: 7 /HPF (ref 0–5)
WBC UR: NONE SEEN /HPF (ref 0–5)

## 2014-03-27 LAB — CBC
HCT: 41.3 % (ref 35.0–47.0)
HGB: 13.9 g/dL (ref 12.0–16.0)
MCH: 27.3 pg (ref 26.0–34.0)
MCHC: 33.6 g/dL (ref 32.0–36.0)
MCV: 81 fL (ref 80–100)
Platelet: 276 10*3/uL (ref 150–440)
RBC: 5.08 10*6/uL (ref 3.80–5.20)
RDW: 15 % — AB (ref 11.5–14.5)
WBC: 11 10*3/uL (ref 3.6–11.0)

## 2014-03-27 LAB — PREGNANCY, URINE: PREGNANCY TEST, URINE: NEGATIVE m[IU]/mL

## 2014-03-27 LAB — WET PREP, GENITAL

## 2014-03-27 LAB — GC/CHLAMYDIA PROBE AMP

## 2014-03-29 LAB — URINE CULTURE

## 2014-12-11 ENCOUNTER — Emergency Department: Payer: Self-pay | Admitting: Emergency Medicine

## 2014-12-11 LAB — URINALYSIS, COMPLETE
BACTERIA: NONE SEEN
BILIRUBIN, UR: NEGATIVE
Glucose,UR: NEGATIVE mg/dL (ref 0–75)
Ketone: NEGATIVE
LEUKOCYTE ESTERASE: NEGATIVE
NITRITE: NEGATIVE
PH: 8 (ref 4.5–8.0)
Protein: 30
RBC,UR: 3 /HPF (ref 0–5)
Specific Gravity: 1.014 (ref 1.003–1.030)
Squamous Epithelial: 26
WBC UR: 5 /HPF (ref 0–5)

## 2014-12-11 LAB — COMPREHENSIVE METABOLIC PANEL
ANION GAP: 7 (ref 7–16)
AST: 11 U/L — AB (ref 15–37)
Albumin: 3.5 g/dL (ref 3.4–5.0)
Alkaline Phosphatase: 62 U/L
BUN: 7 mg/dL (ref 7–18)
Bilirubin,Total: 0.3 mg/dL (ref 0.2–1.0)
CHLORIDE: 104 mmol/L (ref 98–107)
Calcium, Total: 8.1 mg/dL — ABNORMAL LOW (ref 8.5–10.1)
Co2: 26 mmol/L (ref 21–32)
Creatinine: 0.86 mg/dL (ref 0.60–1.30)
EGFR (Non-African Amer.): >60
GLUCOSE: 93 mg/dL (ref 65–99)
Osmolality: 271 (ref 275–301)
POTASSIUM: 3.8 mmol/L (ref 3.5–5.1)
SGPT (ALT): 19 U/L
SODIUM: 137 mmol/L (ref 136–145)
Total Protein: 7.4 g/dL (ref 6.4–8.2)

## 2014-12-11 LAB — CBC
HCT: 44.2 % (ref 35.0–47.0)
HGB: 14.3 g/dL (ref 12.0–16.0)
MCH: 27.3 pg (ref 26.0–34.0)
MCHC: 32.4 g/dL (ref 32.0–36.0)
MCV: 84 fL (ref 80–100)
PLATELETS: 243 10*3/uL (ref 150–440)
RBC: 5.25 10*6/uL — ABNORMAL HIGH (ref 3.80–5.20)
RDW: 14.9 % — AB (ref 11.5–14.5)
WBC: 15.1 10*3/uL — ABNORMAL HIGH (ref 3.6–11.0)

## 2014-12-15 LAB — BETA STREP CULTURE(ARMC)

## 2015-05-21 ENCOUNTER — Encounter: Payer: Self-pay | Admitting: Emergency Medicine

## 2015-05-21 ENCOUNTER — Emergency Department
Admission: EM | Admit: 2015-05-21 | Discharge: 2015-05-21 | Disposition: A | Discharge status: Home / Self Care | Payer: Medicaid Other | Attending: Emergency Medicine | Admitting: Emergency Medicine

## 2015-05-21 ENCOUNTER — Emergency Department: Payer: Medicaid Other

## 2015-05-21 DIAGNOSIS — Z3201 Encounter for pregnancy test, result positive: Secondary | ICD-10-CM | POA: Diagnosis not present

## 2015-05-21 DIAGNOSIS — Z792 Long term (current) use of antibiotics: Secondary | ICD-10-CM | POA: Insufficient documentation

## 2015-05-21 DIAGNOSIS — R102 Pelvic and perineal pain: Secondary | ICD-10-CM

## 2015-05-21 DIAGNOSIS — Z3490 Encounter for supervision of normal pregnancy, unspecified, unspecified trimester: Secondary | ICD-10-CM

## 2015-05-21 DIAGNOSIS — R112 Nausea with vomiting, unspecified: Secondary | ICD-10-CM | POA: Insufficient documentation

## 2015-05-21 DIAGNOSIS — Z880 Allergy status to penicillin: Secondary | ICD-10-CM | POA: Insufficient documentation

## 2015-05-21 DIAGNOSIS — R109 Unspecified abdominal pain: Secondary | ICD-10-CM | POA: Insufficient documentation

## 2015-05-21 DIAGNOSIS — Z87891 Personal history of nicotine dependence: Secondary | ICD-10-CM | POA: Insufficient documentation

## 2015-05-21 LAB — CBC WITH DIFFERENTIAL/PLATELET
BASOS PCT: 1 %
Basophils Absolute: 0.1 10*3/uL (ref 0–0.1)
EOS ABS: 0.2 10*3/uL (ref 0–0.7)
Eosinophils Relative: 2 %
HCT: 41.5 % (ref 35.0–47.0)
Hemoglobin: 13.3 g/dL (ref 12.0–16.0)
Lymphocytes Relative: 22 %
Lymphs Abs: 2.3 10*3/uL (ref 1.0–3.6)
MCH: 26.9 pg (ref 26.0–34.0)
MCHC: 32.1 g/dL (ref 32.0–36.0)
MCV: 83.8 fL (ref 80.0–100.0)
MONO ABS: 0.7 10*3/uL (ref 0.2–0.9)
Monocytes Relative: 7 %
NEUTROS ABS: 7.1 10*3/uL — AB (ref 1.4–6.5)
Neutrophils Relative %: 68 %
Platelets: 215 10*3/uL (ref 150–440)
RBC: 4.95 MIL/uL (ref 3.80–5.20)
RDW: 14 % (ref 11.5–14.5)
WBC: 10.4 10*3/uL (ref 3.6–11.0)

## 2015-05-21 LAB — URINALYSIS COMPLETE WITH MICROSCOPIC (ARMC ONLY)
BILIRUBIN URINE: NEGATIVE
Glucose, UA: NEGATIVE mg/dL
Nitrite: NEGATIVE
Protein, ur: 30 mg/dL — AB
Specific Gravity, Urine: 1.015 (ref 1.005–1.030)
pH: 9 — ABNORMAL HIGH (ref 5.0–8.0)

## 2015-05-21 LAB — ABO/RH: ABO/RH(D): O POS

## 2015-05-21 LAB — HCG, QUANTITATIVE, PREGNANCY: HCG, BETA CHAIN, QUANT, S: 57087 m[IU]/mL — AB (ref <5)

## 2015-05-21 LAB — POCT PREGNANCY, URINE: Preg Test, Ur: POSITIVE — AB

## 2015-05-21 MED ORDER — ONDANSETRON 4 MG PO TBDP
4.0000 mg | ORAL_TABLET | Freq: Once | ORAL | Status: AC
  Administered 2015-05-21: 4 mg via ORAL

## 2015-05-21 MED ORDER — ONDANSETRON 4 MG PO TBDP
ORAL_TABLET | ORAL | Status: AC
  Administered 2015-05-21: 4 mg via ORAL
  Filled 2015-05-21: qty 1

## 2015-05-21 MED ORDER — ONDANSETRON HCL 4 MG PO TABS: 4.0000 mg | ORAL_TABLET | Freq: Every day | ORAL | Status: DC | PRN

## 2015-05-21 NOTE — Discharge Instructions (Signed)
Please seek medical attention for any high fevers, chest pain, shortness of breath, change in behavior, persistent vomiting, bloody stool or any other new or concerning symptoms. As we discussed please contact your urologist in case the pain is being caused by a kidney stone. Ultrasound today has you dated at 6 weeks and 1 day.  Prenatal Care  WHAT IS PRENATAL CARE?  Prenatal care means health care during your pregnancy, before your baby is born. It is very important to take care of yourself and your baby during your pregnancy by:   Getting early prenatal care. If you know you are pregnant, or think you might be pregnant, call your health care provider as soon as possible. Schedule a visit for a prenatal exam.  Getting regular prenatal care. Follow your health care provider's schedule for blood and other necessary tests. Do not miss appointments.  Doing everything you can to keep yourself and your baby healthy during your pregnancy.  Getting complete care. Prenatal care should include evaluation of the medical, dietary, educational, psychological, and social needs of you and your significant other. The medical and genetic history of your family and the family of your baby's father should be discussed with your health care provider.  Discussing with your health care provider:  Prescription, over-the-counter, and herbal medicines that you take.  Any history of substance abuse, alcohol use, smoking, and illegal drug use.  Any history of domestic abuse and violence.  Immunizations you have received.  Your nutrition and diet.  The amount of exercise you do.  Any environmental and occupational hazards to which you are exposed.  History of sexually transmitted infections for both you and your partner.  Previous pregnancies you have had. WHY IS PRENATAL CARE SO IMPORTANT?  By regularly seeing your health care provider, you help ensure that problems can be identified early so that they can  be treated as soon as possible. Other problems might be prevented. Many studies have shown that early and regular prenatal care is important for the health of mothers and their babies.  HOW CAN I TAKE CARE OF MYSELF WHILE I AM PREGNANT?  Here are ways to take care of yourself and your baby:   Start or continue taking your multivitamin with 400 micrograms (mcg) of folic acid every day.  Get early and regular prenatal care. It is very important to see a health care provider during your pregnancy. Your health care provider will check at each visit to make sure that you and your baby are healthy. If there are any problems, action can be taken right away to help you and your baby.  Eat a healthy diet that includes:  Fruits.  Vegetables.  Foods low in saturated fat.  Whole grains.  Calcium-rich foods, such as milk, yogurt, and hard cheeses.  Drink 6-8 glasses of liquids a day.  Unless your health care provider tells you not to, try to be physically active for 30 minutes, most days of the week. If you are pressed for time, you can get your activity in through 10-minute segments, three times a day.  Do not smoke, drink alcohol, or use drugs. These can cause long-term damage to your baby. Talk with your health care provider about steps to take to stop smoking. Talk with a member of your faith community, a counselor, a trusted friend, or your health care provider if you are concerned about your alcohol or drug use.  Ask your health care provider before taking any medicine, even over-the-counter  medicines. Some medicines are not safe to take during pregnancy.  Get plenty of rest and sleep.  Avoid hot tubs and saunas during pregnancy.  Do not have X-rays taken unless absolutely necessary and with the recommendation of your health care provider. A lead shield can be placed on your abdomen to protect your baby when X-rays are taken in other parts of your body.  Do not empty the cat litter when  you are pregnant. It may contain a parasite that causes an infection called toxoplasmosis, which can cause birth defects. Also, use gloves when working in garden areas used by cats.  Do not eat uncooked or undercooked meats or fish.  Do not eat soft, mold-ripened cheeses (Brie, Camembert, and chevre) or soft, blue-veined cheese (Danish blue and Roquefort).  Stay away from toxic chemicals like:  Insecticides.  Solvents (some cleaners or paint thinners).  Lead.  Mercury.  Sexual intercourse may continue until the end of the pregnancy, unless you have a medical problem or there is a problem with the pregnancy and your health care provider tells you not to.  Do not wear high-heel shoes, especially during the second half of the pregnancy. You can lose your balance and fall.  Do not take long trips, unless absolutely necessary. Be sure to see your health care provider before going on the trip.  Do not sit in one position for more than 2 hours when on a trip.  Take a copy of your medical records when going on a trip. Know where a hospital is located in the city you are visiting, in case of an emergency.  Most dangerous household products will have pregnancy warnings on their labels. Ask your health care provider about products if you are unsure.  Limit or eliminate your caffeine intake from coffee, tea, sodas, medicines, and chocolate.  Many women continue working through pregnancy. Staying active might help you stay healthier. If you have a question about the safety or the hours you work at your particular job, talk with your health care provider.  Get informed:  Read books.  Watch videos.  Go to childbirth classes for you and your significant other.  Talk with experienced moms.  Ask your health care provider about childbirth education classes for you and your partner. Classes can help you and your partner prepare for the birth of your baby.  Ask about a baby doctor  (pediatrician) and methods and pain medicine for labor, delivery, and possible cesarean delivery. HOW OFTEN SHOULD I SEE MY HEALTH CARE PROVIDER DURING PREGNANCY?  Your health care provider will give you a schedule for your prenatal visits. You will have visits more often as you get closer to the end of your pregnancy. An average pregnancy lasts about 40 weeks.  A typical schedule includes visiting your health care provider:   About once each month during your first 6 months of pregnancy.  Every 2 weeks during the next 2 months.  Weekly in the last month, until the delivery date. Your health care provider will probably want to see you more often if:  You are older than 35 years.  Your pregnancy is high risk because you have certain health problems or problems with the pregnancy, such as:  Diabetes.  High blood pressure.  The baby is not growing on schedule, according to the dates of the pregnancy. Your health care provider will do special tests to make sure you and your baby are not having any serious problems. WHAT HAPPENS DURING PRENATAL  VISITS?   At your first prenatal visit, your health care provider will do a physical exam and talk to you about your health history and the health history of your partner and your family. Your health care provider will be able to tell you what date to expect your baby to be born on.  Your first physical exam will include checks of your blood pressure, measurements of your height and weight, and an exam of your pelvic organs. Your health care provider will do a Pap test if you have not had one recently and will do cultures of your cervix to make sure there is no infection.  At each prenatal visit, there will be tests of your blood, urine, blood pressure, weight, and the progress of the baby will be checked.  At your later prenatal visits, your health care provider will check how you are doing and how your baby is developing. You may have a number of  tests done as your pregnancy progresses.  Ultrasound exams are often used to check on your baby's growth and health.  You may have more urine and blood tests, as well as special tests, if needed. These may include amniocentesis to examine fluid in the pregnancy sac, stress tests to check how the baby responds to contractions, or a biophysical profile to measure your baby's well-being. Your health care provider will explain the tests and why they are necessary.  You should be tested for high blood sugar (gestational diabetes) between the 24th and 28th weeks of your pregnancy.  You should discuss with your health care provider your plans to breastfeed or bottle-feed your baby.  Each visit is also a chance for you to learn about staying healthy during pregnancy and to ask questions. Document Released: 11/25/2003 Document Revised: 11/27/2013 Document Reviewed: 02/06/2014 [facility] Patient Information 2015 [place], [place]. This information is not intended to replace advice given to you by your health care provider. Make sure you discuss any questions you have with your health care provider.

## 2015-05-21 NOTE — ED Notes (Signed)
Urine POCT (positve) documented in glucometer

## 2015-05-21 NOTE — ED Provider Notes (Signed)
[facility] Emergency Department Provider Note     ____________________________________________  Time seen: 1645  I have reviewed the triage vital signs and the nursing notes.   HISTORY  Chief Complaint Flank Pain   History limited by: Not Limited   HPI Brenda Todd is a 34 y.o. female who presents to the emergency department with right flank pain for the past 4 days. Patient states that the pain starts in her right flank and radiates down into her groin. It is a dull pain. It is accompanied by some nausea and vomiting. Additionally the patient states she has had fevers. Has a history of kidney stones but states this is slightly different than her normal kidney stone pain.     Past Medical History  Diagnosis Date  . Kidney calculi   . UTI (lower urinary tract infection)     There are no active problems to display for this patient.   Past Surgical History  Procedure Laterality Date  . Appendectomy    . Lithotripsy      Current Outpatient Rx  Name  Route  Sig  Dispense  Refill  . ciprofloxacin (CIPRO) 500 MG tablet   Oral   Take 500 mg by mouth 2 (two) times daily.         Marland Kitchen HYDROcodone-acetaminophen (LORTAB) 10-500 MG per tablet   Oral   Take 1 tablet by mouth every 6 (six) hours as needed. Pain         . promethazine (PHENERGAN) 25 MG tablet   Oral   Take 25 mg by mouth every 6 (six) hours as needed. Nausea           Allergies Penicillins  No family history on file.  Social History History  Substance Use Topics  . Smoking status: Former Research scientist (life sciences)  . Smokeless tobacco: Never Used  . Alcohol Use: No    Review of Systems  Constitutional: Negative for fever. Cardiovascular: Negative for chest pain. Respiratory: Negative for shortness of breath. Gastrointestinal: Right flank pain with radiation into the groin Genitourinary: Negative for dysuria. Musculoskeletal: Negative for back pain. Skin: Negative for  rash. Neurological: Negative for headaches, focal weakness or numbness.   10-point ROS otherwise negative.  ____________________________________________   PHYSICAL EXAM:  VITAL SIGNS: ED Triage Vitals  Enc Vitals Group     BP 05/21/15 1408 161/104 mmHg     Pulse Rate 05/21/15 1408 83     Resp 05/21/15 1408 16     Temp 05/21/15 1408 97.8 F (36.6 C)     Temp Source 05/21/15 1408 Oral     SpO2 05/21/15 1408 100 %     Weight 05/21/15 1408 162 lb (73.483 kg)     Height 05/21/15 1408 5\' 3"  (1.6 m)     Head Cir --      Peak Flow --      Pain Score 05/21/15 1411 9   Constitutional: Alert and oriented. Well appearing and in no distress. Eyes: Conjunctivae are normal. PERRL. Normal extraocular movements. ENT   Head: Normocephalic and atraumatic.   Nose: No congestion/rhinnorhea.   Mouth/Throat: Mucous membranes are moist.   Neck: No stridor. Hematological/Lymphatic/Immunilogical: No cervical lymphadenopathy. Cardiovascular: Normal rate, regular rhythm.  No murmurs, rubs, or gallops. Respiratory: Normal respiratory effort without tachypnea nor retractions. Breath sounds are clear and equal bilaterally. No wheezes/rales/rhonchi. Gastrointestinal: Soft and minimally tender to palpation in the right quadrant. Genitourinary: Deferred Musculoskeletal: Normal range of motion in all extremities. No joint effusions.  No lower extremity tenderness nor edema. Neurologic:  Normal speech and language. No gross focal neurologic deficits are appreciated. Speech is normal.  Skin:  Skin is warm, dry and intact. No rash noted. Psychiatric: Mood and affect are normal. Speech and behavior are normal. Patient exhibits appropriate insight and judgment.  ____________________________________________    LABS (pertinent positives/negatives)  Labs Reviewed  URINALYSIS COMPLETEWITH MICROSCOPIC ([facility] ONLY) - Abnormal; Notable for the following:    Color, Urine YELLOW (*)    APPearance  CLOUDY (*)    Ketones, ur TRACE (*)    Hgb urine dipstick 3+ (*)    pH 9.0 (*)    Protein, ur 30 (*)    Leukocytes, UA 2+ (*)    Bacteria, UA RARE (*)    Squamous Epithelial / LPF 0-5 (*)    All other components within normal limits  CBC WITH DIFFERENTIAL/PLATELET - Abnormal; Notable for the following:    Neutro Abs 7.1 (*)    All other components within normal limits  HCG, QUANTITATIVE, PREGNANCY - Abnormal; Notable for the following:    hCG, Beta Chain, Quant, S 57087 (*)    All other components within normal limits  POCT PREGNANCY, URINE - Abnormal; Notable for the following:    Preg Test, Ur POSITIVE (*)    All other components within normal limits  POC URINE PREG, ED  ABO/RH  ABO/RH     ____________________________________________   EKG  None  ____________________________________________    RADIOLOGY  Renal ultrasound  IMPRESSION: 1. No evidence of hydronephrosis. Both ureteral jets are identified within the bladder. 2. 9 mm nonobstructing stone in left lower pole.  Transvaginal OB ultrasound  IMPRESSION: Single viable intrauterine pregnancy. The fetal heart rate is 124 beats per minute. By crown-rump length, the estimated gestational age is 6 weeks 1 day which is less than the reported gestational age of [redacted] weeks 2 days.  Small subchorionic hemorrhage. ____________________________________________   PROCEDURES  Procedure(s) performed: None  Critical Care performed: No  ____________________________________________   INITIAL IMPRESSION / ASSESSMENT AND PLAN / ED COURSE  Pertinent labs & imaging results that were available during my care of the patient were reviewed by me and considered in my medical decision making (see chart for details).  Patient here with right flank pain. Urine pregnancy was positive. Patient states this is possible. Serum beta hCG also elevated. At this point unclear if right flank pain secondary to kidney stone or possible  ectopic. Will ultrasound OB and get renal ultrasound.  ----------------------------------------- 7:32 PM on 05/21/2015 -----------------------------------------  Ultrasound shows a 6 week 1 day intrauterine pregnancy. Renal ultrasound without concerning findings. Had discussion with patient that she needs to contact her urologist case this is a stone. At this point however given that it is nonobstructing do not feel radiating her would be the wisest choice. Will send off a urine culture. Advised patient to take prenatal vitamins and establish prenatal care.  ____________________________________________   FINAL CLINICAL IMPRESSION(S) / ED DIAGNOSES  Final diagnoses:  Pregnant     Nance Pear, MD 05/21/15 [phone]

## 2015-05-21 NOTE — ED Notes (Signed)
Pt reports for past 4 days right lower back pain that radiates to groin. History of kidney stones.

## 2015-05-21 NOTE — ED Notes (Signed)
C/o right flank pain past 4 days, some fever, some uti symptoms, rlq abd tender and right low back

## 2015-05-21 NOTE — ED Notes (Signed)
RN called lab to add on urine culture, stated they would.

## 2015-05-22 LAB — ABO/RH: ABO/RH(D): O POS

## 2015-05-23 LAB — URINE CULTURE

## 2015-06-11 ENCOUNTER — Other Ambulatory Visit: Payer: Self-pay | Admitting: Advanced Practice Midwife

## 2015-06-11 DIAGNOSIS — Z369 Encounter for antenatal screening, unspecified: Secondary | ICD-10-CM

## 2015-06-11 LAB — OB RESULTS CONSOLE GC/CHLAMYDIA: GC PROBE AMP, GENITAL: NEGATIVE

## 2015-06-12 LAB — OB RESULTS CONSOLE HEPATITIS B SURFACE ANTIGEN: HEP B S AG: NEGATIVE

## 2015-06-12 LAB — OB RESULTS CONSOLE GC/CHLAMYDIA: CHLAMYDIA, DNA PROBE: NEGATIVE

## 2015-06-12 LAB — OB RESULTS CONSOLE HIV ANTIBODY (ROUTINE TESTING): HIV: NONREACTIVE

## 2015-06-12 LAB — OB RESULTS CONSOLE RPR: RPR: NONREACTIVE

## 2015-06-12 LAB — OB RESULTS CONSOLE VARICELLA ZOSTER ANTIBODY, IGG: Varicella: IMMUNE

## 2015-07-10 ENCOUNTER — Ambulatory Visit
Admission: RE | Admit: 2015-07-10 | Discharge: 2015-07-10 | Disposition: A | Discharge status: Home / Self Care | Payer: Medicaid Other | Source: Ambulatory Visit | Attending: Obstetrics and Gynecology | Admitting: Obstetrics and Gynecology

## 2015-07-10 ENCOUNTER — Ambulatory Visit
Admission: RE | Admit: 2015-07-10 | Discharge: 2015-07-10 | Disposition: A | Discharge status: Home / Self Care | Payer: Medicaid Other | Source: Ambulatory Visit | Attending: Advanced Practice Midwife | Admitting: Advanced Practice Midwife

## 2015-07-10 DIAGNOSIS — Z8759 Personal history of other complications of pregnancy, childbirth and the puerperium: Secondary | ICD-10-CM

## 2015-07-10 DIAGNOSIS — Z3A13 13 weeks gestation of pregnancy: Secondary | ICD-10-CM | POA: Insufficient documentation

## 2015-07-10 DIAGNOSIS — Z36 Encounter for antenatal screening of mother: Secondary | ICD-10-CM | POA: Insufficient documentation

## 2015-07-10 DIAGNOSIS — O09291 Supervision of pregnancy with other poor reproductive or obstetric history, first trimester: Secondary | ICD-10-CM | POA: Diagnosis not present

## 2015-07-10 DIAGNOSIS — Z369 Encounter for antenatal screening, unspecified: Secondary | ICD-10-CM

## 2015-07-10 LAB — US OB COMP LESS 14 WKS

## 2015-07-10 NOTE — Progress Notes (Signed)
Department, [facility] Length of Consultation: 45 minutes   [name]  was referred to [facility] for genetic counseling to review prenatal screening and testing options.  This note summarizes the information we discussed.    First trimester screening, which can include nuchal translucency ultrasound screen and/or first trimester maternal serum marker screening.  The nuchal translucency has approximately an 80% detection rate for Down syndrome and can be positive for other chromosome abnormalities as well as congenital heart defects.  When combined with a maternal serum marker screening, the detection rate is up to 90% for Down syndrome and up to 97% for trisomy 18.     Maternal serum marker screening, a blood test that measures pregnancy proteins, can provide risk assessments for Down syndrome, trisomy 18, and open neural tube defects (spina bifida, anencephaly). Because it does not directly examine the fetus, it cannot positively diagnose or rule out these problems.  Targeted ultrasound uses high frequency sound waves to create an image of the developing fetus.  An ultrasound is often recommended as a routine means of evaluating the pregnancy.  It is also used to screen for fetal anatomy problems (for example, a heart defect) that might be suggestive of a chromosomal or other abnormality.   Should these screening tests indicate an increased concern, then the following diagnostic options would be offered:  The chorionic villus sampling procedure is available for first trimester chromosome analysis.  This involves the withdrawal of a small amount of chorionic villi (tissue from the developing placenta).  Risk of pregnancy loss is estimated to be approximately 1 in 200 to 1 in 100 (0.5 to 1%).  There is approximately a 1% (1 in 100) chance that the CVS chromosome results will be unclear.  Chorionic villi cannot be tested for neural tube defects.     Amniocentesis involves the  removal of a small amount of amniotic fluid from the sac surrounding the fetus with the use of a thin needle inserted through the maternal abdomen and uterus.  Ultrasound guidance is used throughout the procedure.  Fetal cells from amniotic fluid are directly evaluated and > 99.5% of chromosome problems and > 98% of open neural tube defects can be detected. This procedure is generally performed after the 15th week of pregnancy.  The main risks to this procedure include complications leading to miscarriage in less than 1 in 200 cases (0.5%).  As another option for information if the pregnancy is suspected to be an an increased chance for certain chromosome conditions, we also reviewed the availability of cell free fetal DNA testing from maternal blood to determine whether or not the baby may have either Down syndrome, trisomy 80, or trisomy 87.  This test utilizes a maternal blood sample and DNA sequencing technology to isolate circulating cell free fetal DNA from maternal plasma.  The fetal DNA can then be analyzed for DNA sequences that are derived from the three most common chromosomes involved in aneuploidy, chromosomes 13, 18, and 21.  If the overall amount of DNA is greater than the expected level for any of these chromosomes, aneuploidy is suspected.  This testing is commercially available, and is able to provide another means of determining the chance for one of these common chromosome conditions, without requiring an invasive procedure and traditional karyotype analysis.  While this is new technology, the testing does show great promise, and we offered it as an option.  We discussed this option with her in detail.  We explained  that while we do not consider it a replacement for invasive testing and karyotype analysis, a negative result from this testing would be reassuring, though not a guarantee of a normal chromosome complement for the baby.  An abnormal result is certainly suggestive of an abnormal  chromosome complement, though we would still recommend CVS or amniocentesis to confirm any findings from this testing.   Cystic Fibrosis screening was also discussed with the patient. Cystic fibrosis (CF) is one of the most common genetic conditions in persons of Caucasian ancestry.  This condition occurs in approximately 1 in 2,500 Caucasian persons and results in thickened secretions in the lungs, digestive, and reproductive systems.  For a baby to be at risk for having CF, both of the parents must be carriers for this condition.  Approximately 1 in 33 Caucasian persons is a carrier for CF.  Current carrier testing looks for the most common mutations in the gene for CF and can detect approximately 90% of carriers in the Caucasian population.  This means that the carrier screening can greatly reduce, but cannot eliminate, the chance for an individual to have a child with CF.  If an individual is found to be a carrier for CF, then carrier testing would be available for the partner. As part of [place] newborn screening profile, all babies born in the state of [place] will have a two-tier screening process.  Specimens are first tested to determine the concentration of immunoreactive trypsinogen (IRT).  The top 5% of specimens with the highest IRT values then undergo DNA testing using a panel of over 40 common CF mutations. Carrier screening was declined.  We obtained a detailed family history and pregnancy history.  The patient stated that her first pregnancy resulted in an immediate neonatal demise of a female infant (named, "Brenda Todd").  The patient reported that she had routine prenatal care.  The pregnancy was reported to be uncomplicated and had an uncomplicated spontaneous vaginal delivery.  An autopsy was reported to have revealed "congestive heart failure" and "myocardial infarction".  Karyotype were reportedly not performed.  No obvious malformations were reported.  The baby was born at  [place] and we will work to obtain the paper chart for review.  This pregnancy was conceived with a new partner.  We explained that there can be many causes for neonatal demise.  Approximately 15% of stillborns or neonatal demise are due to karyotype abnormalities.  If this baby had aneuploidy, we would not expect the risk for recurrence to exceed the patient's age-related risk.  If we are able to obtain additional information that may impact the recommendations made to this pregnancy, we will plan to follow-up with the patient.   The remainder of the family history was reported to be unremarkable for birth defects, mental retardation, recurrent pregnancy loss or known chromosome abnormalities.  Consanguinity was denied.  [name] reported no additional pregnancy complications or exposures.  After consideration of the options, [name] elected to proceed with First Trimester screening.  At the time of ultrasound, the fetal CRL was above the range to offer FTS today.  We have discussed the option of a Tetra screen with the patient today.  [name] is not interested in further screening for aneuploidy unless a review of medical records increases the recurrence in this pregnancy.  Termination of the pregnancy is not an option and therefore any information obtained from screening or testing of this fetus would be used for preparation purposes only.  An  ultrasound was performed at the time of the visit.  The gestational age was consistent with  40 weeks.  Fetal anatomy could not be assessed due to early gestational age.  Please refer to the ultrasound report for details of that study.  [name] was encouraged to call with questions or concerns.  We can be contacted at [phone].    I met with the pt and reviewed the genetic counselors plan  Pt 's fetus CRL ws too large for first tri screen  She declined alternative screens  Gatha Mayer

## 2015-07-13 ENCOUNTER — Encounter: Payer: Self-pay | Admitting: Emergency Medicine

## 2015-07-13 ENCOUNTER — Inpatient Hospital Stay
Admission: EM | Admit: 2015-07-13 | Discharge: 2015-07-16 | DRG: 781 | Disposition: A | Discharge status: Home / Self Care | Payer: Medicaid Other | Attending: Obstetrics & Gynecology | Admitting: Obstetrics & Gynecology

## 2015-07-13 ENCOUNTER — Emergency Department: Payer: Medicaid Other

## 2015-07-13 DIAGNOSIS — O23591 Infection of other part of genital tract in pregnancy, first trimester: Secondary | ICD-10-CM | POA: Diagnosis present

## 2015-07-13 DIAGNOSIS — O2302 Infections of kidney in pregnancy, second trimester: Secondary | ICD-10-CM | POA: Diagnosis present

## 2015-07-13 DIAGNOSIS — Z79899 Other long term (current) drug therapy: Secondary | ICD-10-CM

## 2015-07-13 DIAGNOSIS — Z3A13 13 weeks gestation of pregnancy: Secondary | ICD-10-CM | POA: Diagnosis present

## 2015-07-13 DIAGNOSIS — Z87891 Personal history of nicotine dependence: Secondary | ICD-10-CM

## 2015-07-13 DIAGNOSIS — Z880 Allergy status to penicillin: Secondary | ICD-10-CM

## 2015-07-13 DIAGNOSIS — O26891 Other specified pregnancy related conditions, first trimester: Secondary | ICD-10-CM | POA: Diagnosis present

## 2015-07-13 DIAGNOSIS — N12 Tubulo-interstitial nephritis, not specified as acute or chronic: Secondary | ICD-10-CM | POA: Diagnosis present

## 2015-07-13 DIAGNOSIS — R109 Unspecified abdominal pain: Secondary | ICD-10-CM

## 2015-07-13 DIAGNOSIS — B9689 Other specified bacterial agents as the cause of diseases classified elsewhere: Secondary | ICD-10-CM | POA: Diagnosis present

## 2015-07-13 DIAGNOSIS — R112 Nausea with vomiting, unspecified: Secondary | ICD-10-CM | POA: Diagnosis present

## 2015-07-13 DIAGNOSIS — O2301 Infections of kidney in pregnancy, first trimester: Principal | ICD-10-CM | POA: Diagnosis present

## 2015-07-13 LAB — CBC
HCT: 38.8 % (ref 35.0–47.0)
HEMOGLOBIN: 13.1 g/dL (ref 12.0–16.0)
MCH: 27.4 pg (ref 26.0–34.0)
MCHC: 33.8 g/dL (ref 32.0–36.0)
MCV: 81 fL (ref 80.0–100.0)
Platelets: 198 10*3/uL (ref 150–440)
RBC: 4.79 MIL/uL (ref 3.80–5.20)
RDW: 13.7 % (ref 11.5–14.5)
WBC: 12.8 10*3/uL — ABNORMAL HIGH (ref 3.6–11.0)

## 2015-07-13 LAB — COMPREHENSIVE METABOLIC PANEL
ALK PHOS: 43 U/L (ref 38–126)
ALT: 21 U/L (ref 14–54)
AST: 19 U/L (ref 15–41)
Albumin: 3.6 g/dL (ref 3.5–5.0)
Anion gap: 9 (ref 5–15)
BUN: 8 mg/dL (ref 6–20)
CALCIUM: 8.8 mg/dL — AB (ref 8.9–10.3)
CO2: 24 mmol/L (ref 22–32)
Chloride: 102 mmol/L (ref 101–111)
Creatinine, Ser: 0.61 mg/dL (ref 0.44–1.00)
GFR calc Af Amer: >60 mL/min (ref >60)
GLUCOSE: 110 mg/dL — AB (ref 65–99)
Potassium: 3.4 mmol/L — ABNORMAL LOW (ref 3.5–5.1)
SODIUM: 135 mmol/L (ref 135–145)
TOTAL PROTEIN: 7 g/dL (ref 6.5–8.1)
Total Bilirubin: 0.3 mg/dL (ref 0.3–1.2)

## 2015-07-13 LAB — URINALYSIS COMPLETE WITH MICROSCOPIC (ARMC ONLY)
Bilirubin Urine: NEGATIVE
GLUCOSE, UA: NEGATIVE mg/dL
KETONES UR: NEGATIVE mg/dL
NITRITE: NEGATIVE
Protein, ur: 30 mg/dL — AB
RBC / HPF: NONE SEEN RBC/hpf (ref 0–5)
SPECIFIC GRAVITY, URINE: 1.014 (ref 1.005–1.030)
pH: 5 (ref 5.0–8.0)

## 2015-07-13 MED ORDER — OXYCODONE-ACETAMINOPHEN 5-325 MG PO TABS
1.0000 | ORAL_TABLET | ORAL | Status: DC | PRN
  Administered 2015-07-14 – 2015-07-16 (×5): 2 via ORAL
  Filled 2015-07-13 (×5): qty 2

## 2015-07-13 MED ORDER — ONDANSETRON HCL 4 MG/2ML IJ SOLN
4.0000 mg | Freq: Once | INTRAMUSCULAR | Status: AC
  Administered 2015-07-13: 4 mg via INTRAVENOUS

## 2015-07-13 MED ORDER — LACTATED RINGERS IV SOLN
INTRAVENOUS | Status: DC
  Administered 2015-07-13 – 2015-07-16 (×9): via INTRAVENOUS

## 2015-07-13 MED ORDER — DEXTROSE 5 % IV SOLN
1.0000 g | Freq: Three times a day (TID) | INTRAVENOUS | Status: DC
  Administered 2015-07-13: 1 g via INTRAVENOUS
  Filled 2015-07-13 (×4): qty 1

## 2015-07-13 MED ORDER — MORPHINE SULFATE 2 MG/ML IJ SOLN
2.0000 mg | INTRAMUSCULAR | Status: DC | PRN
  Administered 2015-07-13 – 2015-07-14 (×5): 2 mg via INTRAVENOUS
  Filled 2015-07-13 (×5): qty 1

## 2015-07-13 MED ORDER — ZOLPIDEM TARTRATE 5 MG PO TABS
5.0000 mg | ORAL_TABLET | Freq: Every evening | ORAL | Status: DC | PRN
Start: 2015-07-13 — End: 2015-07-16

## 2015-07-13 MED ORDER — CALCIUM CARBONATE ANTACID 500 MG PO CHEW: 2.0000 | CHEWABLE_TABLET | ORAL | Status: DC | PRN

## 2015-07-13 MED ORDER — ONDANSETRON HCL 4 MG/2ML IJ SOLN
4.0000 mg | Freq: Four times a day (QID) | INTRAMUSCULAR | Status: DC | PRN
  Administered 2015-07-13 – 2015-07-14 (×2): 4 mg via INTRAVENOUS
  Filled 2015-07-13 (×2): qty 2

## 2015-07-13 MED ORDER — PRENATAL MULTIVITAMIN CH
1.0000 | ORAL_TABLET | Freq: Every day | ORAL | Status: DC
  Administered 2015-07-15 – 2015-07-16 (×2): 1 via ORAL
  Filled 2015-07-13 (×2): qty 1

## 2015-07-13 MED ORDER — AZTREONAM 1 G IJ SOLR
1.0000 g | Freq: Three times a day (TID) | INTRAMUSCULAR | Status: DC
  Administered 2015-07-14 – 2015-07-16 (×8): 1 g via INTRAVENOUS
  Filled 2015-07-13 (×12): qty 1

## 2015-07-13 MED ORDER — PROMETHAZINE HCL 25 MG/ML IJ SOLN
12.5000 mg | INTRAMUSCULAR | Status: DC | PRN
  Administered 2015-07-13: 12.5 mg via INTRAVENOUS
  Administered 2015-07-14 – 2015-07-16 (×8): 25 mg via INTRAVENOUS
  Administered 2015-07-16: 12.5 mg via INTRAVENOUS
  Filled 2015-07-13 (×11): qty 1

## 2015-07-13 MED ORDER — MORPHINE SULFATE 4 MG/ML IJ SOLN
4.0000 mg | Freq: Once | INTRAMUSCULAR | Status: AC
  Administered 2015-07-13: 4 mg via INTRAVENOUS
  Filled 2015-07-13: qty 1

## 2015-07-13 MED ORDER — ONDANSETRON HCL 4 MG/2ML IJ SOLN
INTRAMUSCULAR | Status: AC
  Administered 2015-07-13: 4 mg via INTRAVENOUS
  Filled 2015-07-13: qty 2

## 2015-07-13 MED ORDER — SODIUM CHLORIDE 0.9 % IV BOLUS (SEPSIS)
1000.0000 mL | Freq: Once | INTRAVENOUS | Status: AC
Start: 2015-07-13 — End: 2015-07-13
  Administered 2015-07-13: 1000 mL via INTRAVENOUS
  Filled 2015-07-13: qty 1000

## 2015-07-13 MED ORDER — ACETAMINOPHEN 325 MG PO TABS: 650.0000 mg | ORAL_TABLET | ORAL | Status: DC | PRN

## 2015-07-13 NOTE — ED Provider Notes (Signed)
[facility] Emergency Department Provider Note  ____________________________________________  Time seen: Approximately 3:09 PM  I have reviewed the triage vital signs and the nursing notes.   HISTORY  Chief Complaint Flank Pain and Emesis    HPI Brenda Todd is a 34 y.o. female G2P0 at 73 weeks 6 days estimated gestational age by last menstrual period and first trimester ultrasound who presents for evaluation of 2 days constant left flank pain, gradual onset, current severity 8 out of 10. No modifying factors. Patient also reports that she has had 15 episodes of nonbloody nonbilious emesis. A few episodes of diarrhea. She was febrile last night to 101.3. She denies any vaginal bleeding but reports that she did see some blood in her urine.    Past Medical History  Diagnosis Date  . Kidney calculi   . UTI (lower urinary tract infection)     Patient Active Problem List   Diagnosis Date Noted  . History of stillbirth 07/10/2015    Past Surgical History  Procedure Laterality Date  . Appendectomy    . Lithotripsy      Current Outpatient Rx  Name  Route  Sig  Dispense  Refill  . ciprofloxacin (CIPRO) 500 MG tablet   Oral   Take 500 mg by mouth 2 (two) times daily.         Marland Kitchen HYDROcodone-acetaminophen (LORTAB) 10-500 MG per tablet   Oral   Take 1 tablet by mouth every 6 (six) hours as needed. Pain         . ondansetron (ZOFRAN) 4 MG tablet   Oral   Take 1 tablet (4 mg total) by mouth daily as needed for nausea or vomiting.   20 tablet   0   . Prenatal Vit-Fe Fumarate-FA (PRENATAL MULTIVITAMIN) TABS tablet   Oral   Take 1 tablet by mouth daily at 12 noon.         . promethazine (PHENERGAN) 25 MG tablet   Oral   Take 25 mg by mouth every 6 (six) hours as needed. Nausea           Allergies Penicillins  No family history on file.  Social History History  Substance Use Topics  . Smoking status: Former Research scientist (life sciences)  . Smokeless  tobacco: Never Used  . Alcohol Use: No    Review of Systems Constitutional: + fever/chills Eyes: No visual changes. ENT: No sore throat. Cardiovascular: Denies chest pain. Respiratory: Denies shortness of breath. Gastrointestinal: No abdominal pain.  + nausea, + vomiting.  + diarrhea.  No constipation. Genitourinary: Positive for increased urinary frequency. Musculoskeletal: Negative for back pain. Skin: Negative for rash. Neurological: Negative for headaches, focal weakness or numbness.  10-point ROS otherwise negative.  ____________________________________________   PHYSICAL EXAM:  VITAL SIGNS: ED Triage Vitals  Enc Vitals Group     BP 07/13/15 1358 153/85 mmHg     Pulse Rate 07/13/15 1358 111     Resp 07/13/15 1358 20     Temp 07/13/15 1358 98.4 F (36.9 C)     Temp Source 07/13/15 1358 Oral     SpO2 07/13/15 1358 97 %     Weight 07/13/15 1358 176 lb (79.833 kg)     Height 07/13/15 1358 5\' 3"  (1.6 m)     Head Cir --      Peak Flow --      Pain Score 07/13/15 1402 10     Pain Loc --      Pain Edu? --  Excl. in [place]? --     Constitutional: Alert and oriented. Nontoxicappearing and in no acute distress. Eyes: Conjunctivae are normal. PERRL. EOMI. Head: Atraumatic. Nose: No congestion/rhinnorhea. Mouth/Throat: Mucous membranes are moist.  Oropharynx non-erythematous. Neck: No stridor.  Cardiovascular: tachycardic rate, regular rhythm. Grossly normal heart sounds.  Good peripheral circulation. Respiratory: Normal respiratory effort.  No retractions. Lungs CTAB. Gastrointestinal: Soft and nontender. No distention. No abdominal bruits. + left CVA tenderness. Genitourinary: deferred Musculoskeletal: No lower extremity tenderness nor edema.  No joint effusions. Neurologic:  Normal speech and language. No gross focal neurologic deficits are appreciated. No gait instability. Skin:  Skin is warm, dry and intact. No rash noted. Psychiatric: Mood and affect are normal.  Speech and behavior are normal.  ____________________________________________   LABS (all labs ordered are listed, but only abnormal results are displayed)  Labs Reviewed  CBC - Abnormal; Notable for the following:    WBC 12.8 (*)    All other components within normal limits  COMPREHENSIVE METABOLIC PANEL - Abnormal; Notable for the following:    Potassium 3.4 (*)    Glucose, Bld 110 (*)    Calcium 8.8 (*)    All other components within normal limits  URINALYSIS COMPLETEWITH MICROSCOPIC ([facility] ONLY) - Abnormal; Notable for the following:    Color, Urine YELLOW (*)    APPearance CLOUDY (*)    Hgb urine dipstick 3+ (*)    Protein, ur 30 (*)    Leukocytes, UA 2+ (*)    Bacteria, UA MANY (*)    Squamous Epithelial / LPF TOO NUMEROUS TO COUNT (*)    All other components within normal limits  CULTURE, BLOOD (ROUTINE X 2)  CULTURE, BLOOD (ROUTINE X 2)  URINE CULTURE   ____________________________________________  EKG  none ____________________________________________  RADIOLOGY  US Renal  IMPRESSION: Unremarkable sonographic survey of the bilateral kidneys.   Limited emergency department bedside ultrasound performed by me shows single live intrauterine pregnancy with fetal heart rate 158 bpm.  ____________________________________________   PROCEDURES  Procedure(s) performed: None  Critical Care performed: No  ____________________________________________   INITIAL IMPRESSION / ASSESSMENT AND PLAN / ED COURSE  Pertinent labs & imaging results that were available during my care of the patient were reviewed by me and considered in my medical decision making (see chart for details).  Brenda Todd is a 34 y.o. female G2P0 at 59 weeks 6 days estimated gestational age by last menstrual period and first trimester ultrasound who presents for evaluation of 2 days constant left flank pain. On exam, she is generally nontoxic appearing. She is tachycardic with  leukocytosis. She has left flank pain. Urinalysis appears contaminated with squamous cells however there are 2+ leuk esterase, numerous white blood cells, no red blood cells. Renal ultrasound is normal, not consistent with an obstructing kidney stone. My concern is for acute pyelonephritis. Discussed with Dr. Kenton Kingfisher of OB who will evaluate and admit.  We'll give IV fluids, morphine for pain, aztreonam given history of anaphylaxis to penicillin. ____________________________________________   FINAL CLINICAL IMPRESSION(S) / ED DIAGNOSES  Final diagnoses:  Pyelonephritis  Acute left flank pain      Joanne Gavel, MD 07/13/15 1743

## 2015-07-13 NOTE — H&P (Signed)
Obstetrics & Gynecology Consultation Note  Date of Consultation: 07/13/2015   Requesting Provider: [facility] ER  Primary OBGYN: [facility] Primary Care Provider: [facility] Department  Reason for Consultation: Left Flank Pain  History of Present Illness: Brenda Todd is a 34 y.o. G2P0010 (Patient's last menstrual period was 04/06/2015.), with the above CC. Pt has had nauea and vomiting throughout first trimester but has been improving, until yesterday when it worsened.  Today noted severe left flank pain and hematuria.  Nausea and vomiting persists.  Pt is currently [redacted] weeks pregnant with no other complications this pregnancy.  ROS: A 12-point review of systems was performed and negative, except as stated in the above HPI.  OBGYN History: As per HPI. OB History    Gravida Para Term Preterm AB TAB SAB Ectopic Multiple Living   2 0 0 0   1   0     No. history of abnormal pap smears (last pap smear: 2 mos, which was normal.) no history of STIs.   She is currently using not using for contraception.  no HRT use.    Past Medical History: Past Medical History  Diagnosis Date  . Kidney calculi   . UTI (lower urinary tract infection)     Past Surgical History: Past Surgical History  Procedure Laterality Date  . Appendectomy    . Lithotripsy      Family History:  No family history on file. She denies any female cancers, bleeding or blood clotting disorders.   Social History:  History   Social History  . Marital Status: Divorced    Spouse Name: N/A  . Number of Children: N/A  . Years of Education: N/A   Occupational History  . Not on file.   Social History Main Topics  . Smoking status: Former Research scientist (life sciences)  . Smokeless tobacco: Never Used  . Alcohol Use: No  . Drug Use: No  . Sexual Activity: Yes    Birth Control/ Protection: Condom   Other Topics Concern  . Not on file   Social History Narrative    Health Maintenance:  Mammogram no, which was done in never;   Colonoscopy no never,  Flu shot no    Allergy: Allergies  Allergen Reactions  . Penicillins Rash    Current Outpatient Medications: PNV, Zofran PRN   Hospital Medications: Current Facility-Administered Medications  Medication Dose Route Frequency Provider Last Rate Last Dose  . acetaminophen (TYLENOL) tablet 650 mg  650 mg Oral Q4H PRN Gae Dry, MD      . aztreonam (AZACTAM) 1 g in dextrose 5 % 50 mL IVPB  1 g Intravenous 3 times per day Ramond Dial, RPH 100 mL/hr at 07/13/15 1637 1 g at 07/13/15 1637  . calcium carbonate (TUMS - dosed in mg elemental calcium) chewable tablet 400 mg of elemental calcium  2 tablet Oral Q4H PRN Gae Dry, MD      . lactated ringers infusion   Intravenous Continuous Gae Dry, MD      . morphine 2 MG/ML injection 2 mg  2 mg Intravenous Q2H PRN Gae Dry, MD      . ondansetron [facility]) injection 4 mg  4 mg Intravenous Q6H PRN Gae Dry, MD      . oxyCODONE-acetaminophen (PERCOCET/ROXICET) 5-325 MG per tablet 1-2 tablet  1-2 tablet Oral Q3H PRN Gae Dry, MD      . Derrill Memo ON 07/14/2015] prenatal multivitamin tablet 1 tablet  1 tablet Oral  Q1200 Gae Dry, MD      . zolpidem [facility]) tablet 5 mg  5 mg Oral QHS PRN Gae Dry, MD       Current Outpatient Prescriptions  Medication Sig Dispense Refill  . Prenatal Vit-Fe Fumarate-FA (PRENATAL MULTIVITAMIN) TABS tablet Take 1 tablet by mouth daily at 12 noon.    . ondansetron (ZOFRAN) 4 MG tablet Take 1 tablet (4 mg total) by mouth daily as needed for nausea or vomiting. 20 tablet 0     Physical Exam: Filed Vitals:   07/13/15 1358 07/13/15 1640  BP: 153/85 125/74  Pulse: 111 88  Temp: 98.4 F (36.9 C)   TempSrc: Oral   Resp: 20 18  Height: 5\' 3"  (1.6 m)   Weight: 79.833 kg (176 lb)   SpO2: 97% 100%    Temp:  [98.4 F (36.9 C)] 98.4 F (36.9 C) (08/07 1358) Pulse Rate:  [88-111] 88 (08/07 1640) Resp:  [18-20] 18 (08/07 1640) BP:  (125-153)/(74-85) 125/74 mmHg (08/07 1640) SpO2:  [97 %-100 %] 100 % (08/07 1640) Weight:  [79.833 kg (176 lb)] 79.833 kg (176 lb) (08/07 1358)     No intake or output data in the 24 hours ending 07/13/15 1739   Current Vital Signs 24h Vital Sign Ranges  T 98.4 F (36.9 C) Temp  Avg: 98.4 F (36.9 C)  Min: 98.4 F (36.9 C)  Max: 98.4 F (36.9 C)  BP 125/74 mmHg BP  Min: 125/74  Max: 153/85  HR 88 Pulse  Avg: 99.5  Min: 88  Max: 111  RR 18 Resp  Avg: 19  Min: 18  Max: 20  SaO2 100 % Not Delivered SpO2  Avg: 98.5 %  Min: 97 %  Max: 100 %       24 Hour I/O Current Shift I/O  Time Ins Outs       Patient Vitals for the past 8 hrs:  BP Temp Temp src Pulse Resp SpO2 Height Weight  07/13/15 1640 125/74 mmHg - - 88 18 100 % - -  07/13/15 1358 (!) 153/85 mmHg 98.4 F (36.9 C) Oral (!) 111 20 97 % 5\' 3"  (1.6 m) 79.833 kg (176 lb)    Body mass index is 31.18 kg/(m^2). General appearance: Well nourished, well developed female in no acute distress.  Neck:  Supple, normal appearance, and no thyromegaly  Cardiovascular:Regular rate and rhythm.  No murmurs, rubs or gallops. Respiratory:  Clear to auscultation bilateral. Normal respiratory effort Abdomen: positive bowel sounds and no masses, hernias; diffusely non tender to palpation, non distended Back: LEFT CVAT, bilat flank T L>R Breasts: not examined. Neuro/Psych:  Normal mood and affect.  Skin:  Warm and dry.  Lymphatic:  No inguinal lymphadenopathy.    Laboratory: Recent Labs Lab 07/13/15 1410  WBC 12.8*  HGB 13.1  HCT 38.8  PLT 198    Recent Labs Lab 07/13/15 1410  NA 135  K 3.4*  CL 102  CO2 24  BUN 8  CREATININE 0.61  CALCIUM 8.8*  PROT 7.0  BILITOT 0.3  ALKPHOS 43  ALT 21  AST 19  GLUCOSE 110*   Imaging:  US KIDNEYS NORMAL  Assessment: Brenda Todd is a 34 y.o. G2P0010 at 13 weeks (Patient's last menstrual period was 04/06/2015.) who presented to the ED with complaints of severe left flank pain, chills,  nausea, hematuria; findings are consistent with pylelonephritis in second trimester.  Plan: IVF ABX- Aztreonam due to allergy Monitor fever, pain, WBC. PNV  Affects on pregnancy discussed.   Brenda Applebaum, MD [facility] OBGYN Pager [phone]

## 2015-07-13 NOTE — ED Notes (Signed)
Pt reports left flank pain x2 days, hx of kidney stones. Pt also reports vomiting x2 days, reports she is [redacted]w[redacted]d pregnant. Pt reports blood in urine, denies vaginal bleeding. Pt reports some lower abdominal cramping, saw OBGYN this week and was told was normal.

## 2015-07-13 NOTE — Consult Note (Signed)
ANTIBIOTIC CONSULT NOTE - INITIAL  Pharmacy Consult for aztreonam Indication: pyelonephritis  Allergies  Allergen Reactions  . Penicillins Rash    Patient Measurements: Height: 5\' 3"  (160 cm) Weight: 176 lb (79.833 kg) IBW/kg (Calculated) : 52.4 Adjusted Body Weight:   Vital Signs: Temp: 98.4 F (36.9 C) (08/07 1358) Temp Source: Oral (08/07 1358) BP: 153/85 mmHg (08/07 1358) Pulse Rate: 111 (08/07 1358) Intake/Output from previous day:   Intake/Output from this shift:    Labs:  Recent Labs  07/13/15 1410  WBC 12.8*  HGB 13.1  PLT 198  CREATININE 0.61   Estimated Creatinine Clearance: 99.2 mL/min (by C-G formula based on Cr of 0.61). No results for input(s): VANCOTROUGH, VANCOPEAK, VANCORANDOM, GENTTROUGH, GENTPEAK, GENTRANDOM, TOBRATROUGH, TOBRAPEAK, TOBRARND, AMIKACINPEAK, AMIKACINTROU, AMIKACIN in the last 72 hours.   Microbiology: No results found for this or any previous visit (from the past 720 hour(s)).  Medical History: Past Medical History  Diagnosis Date  . Kidney calculi   . UTI (lower urinary tract infection)     Medications:  Scheduled:   Assessment: Pt is a 34 year old pregnant female with pyelonephritis with a penicillin allergy (anahpylaxis)  Goal of Therapy:  resolution of infection  Plan:  aztreonam 1 g IV q 8 hours  Christy Ehrsam D [place] 07/13/2015,3:42 PM

## 2015-07-14 LAB — CBC WITH DIFFERENTIAL/PLATELET
BASOS PCT: 0 %
Basophils Absolute: 0 10*3/uL (ref 0–0.1)
Eosinophils Absolute: 0 10*3/uL (ref 0–0.7)
Eosinophils Relative: 0 %
HEMATOCRIT: 34 % — AB (ref 35.0–47.0)
HEMOGLOBIN: 11.7 g/dL — AB (ref 12.0–16.0)
Lymphocytes Relative: 14 %
Lymphs Abs: 1.4 10*3/uL (ref 1.0–3.6)
MCH: 28.1 pg (ref 26.0–34.0)
MCHC: 34.5 g/dL (ref 32.0–36.0)
MCV: 81.6 fL (ref 80.0–100.0)
MONO ABS: 0.8 10*3/uL (ref 0.2–0.9)
MONOS PCT: 8 %
NEUTROS ABS: 7.7 10*3/uL — AB (ref 1.4–6.5)
NEUTROS PCT: 78 %
Platelets: 166 10*3/uL (ref 150–440)
RBC: 4.16 MIL/uL (ref 3.80–5.20)
RDW: 13.8 % (ref 11.5–14.5)
WBC: 10 10*3/uL (ref 3.6–11.0)

## 2015-07-14 LAB — URINE CULTURE: SPECIAL REQUESTS: NORMAL

## 2015-07-14 MED ORDER — SODIUM CHLORIDE 0.9 % IJ SOLN
INTRAMUSCULAR | Status: AC
  Administered 2015-07-14: 10 mL
  Filled 2015-07-14: qty 10

## 2015-07-14 MED ORDER — MEPERIDINE HCL 25 MG/ML IJ SOLN
12.5000 mg | INTRAMUSCULAR | Status: DC | PRN
  Administered 2015-07-14 – 2015-07-16 (×8): 12.5 mg via INTRAVENOUS
  Filled 2015-07-14 (×8): qty 1

## 2015-07-14 MED ORDER — SODIUM CHLORIDE 0.9 % IV SOLN
8.0000 mg | Freq: Three times a day (TID) | INTRAVENOUS | Status: DC | PRN
  Administered 2015-07-14 – 2015-07-15 (×3): 8 mg via INTRAVENOUS
  Filled 2015-07-14 (×4): qty 4

## 2015-07-14 NOTE — Progress Notes (Signed)
Antepartum progress note  S: Still having problems with nausea and vomiting. Vomited after eating breakfast this AM. Had phenergan IV just before her regular breakfast.  Continues to have left flank pain  O: BP 116/73 mmHg  Pulse 80  Temp(Src) 98.2 F (36.8 C) (Oral)  Resp 18  Ht 5\' 3"  (1.6 m)  Wt 176 lb (79.833 kg)  BMI 31.18 kg/m2  SpO2 100%  LMP 04/06/2015  T max since admission was 99.7. Urine output since 2100 last night is 689ml Abdomen: generalized lower abdominal tenderness +Left CVAT, negative right CVAT No calf tenderness  A/P Pyelonephritis-continue IV AZtreonam, urine culture pending Nausea and vomiting: morphine may possibly be causing some of the nausea/ vomiting. Will switch to DEmerol And increase dose of Zofran to 8 mgm IV  Analis Distler, CNM

## 2015-07-15 ENCOUNTER — Observation Stay: Payer: Medicaid Other

## 2015-07-15 DIAGNOSIS — B9689 Other specified bacterial agents as the cause of diseases classified elsewhere: Secondary | ICD-10-CM | POA: Diagnosis present

## 2015-07-15 DIAGNOSIS — Z880 Allergy status to penicillin: Secondary | ICD-10-CM | POA: Diagnosis not present

## 2015-07-15 DIAGNOSIS — R112 Nausea with vomiting, unspecified: Secondary | ICD-10-CM | POA: Diagnosis present

## 2015-07-15 DIAGNOSIS — Z79899 Other long term (current) drug therapy: Secondary | ICD-10-CM | POA: Diagnosis not present

## 2015-07-15 DIAGNOSIS — O26891 Other specified pregnancy related conditions, first trimester: Secondary | ICD-10-CM | POA: Diagnosis present

## 2015-07-15 DIAGNOSIS — O2301 Infections of kidney in pregnancy, first trimester: Secondary | ICD-10-CM | POA: Diagnosis present

## 2015-07-15 DIAGNOSIS — Z3A13 13 weeks gestation of pregnancy: Secondary | ICD-10-CM | POA: Diagnosis present

## 2015-07-15 DIAGNOSIS — Z87891 Personal history of nicotine dependence: Secondary | ICD-10-CM | POA: Diagnosis not present

## 2015-07-15 DIAGNOSIS — N12 Tubulo-interstitial nephritis, not specified as acute or chronic: Secondary | ICD-10-CM | POA: Diagnosis present

## 2015-07-15 DIAGNOSIS — O23591 Infection of other part of genital tract in pregnancy, first trimester: Secondary | ICD-10-CM | POA: Diagnosis present

## 2015-07-15 LAB — CBC WITH DIFFERENTIAL/PLATELET
Basophils Absolute: 0.1 10*3/uL (ref 0–0.1)
Basophils Relative: 1 %
EOS PCT: 1 %
Eosinophils Absolute: 0.1 10*3/uL (ref 0–0.7)
HCT: 36 % (ref 35.0–47.0)
HEMOGLOBIN: 12.2 g/dL (ref 12.0–16.0)
Lymphocytes Relative: 18 %
Lymphs Abs: 1.9 10*3/uL (ref 1.0–3.6)
MCH: 27.6 pg (ref 26.0–34.0)
MCHC: 34 g/dL (ref 32.0–36.0)
MCV: 81 fL (ref 80.0–100.0)
Monocytes Absolute: 0.8 10*3/uL (ref 0.2–0.9)
Monocytes Relative: 8 %
NEUTROS PCT: 72 %
Neutro Abs: 7.6 10*3/uL — ABNORMAL HIGH (ref 1.4–6.5)
PLATELETS: 162 10*3/uL (ref 150–440)
RBC: 4.44 MIL/uL (ref 3.80–5.20)
RDW: 14.2 % (ref 11.5–14.5)
WBC: 10.5 10*3/uL (ref 3.6–11.0)

## 2015-07-15 LAB — BASIC METABOLIC PANEL
ANION GAP: 6 (ref 5–15)
BUN: 5 mg/dL — ABNORMAL LOW (ref 6–20)
CO2: 26 mmol/L (ref 22–32)
Calcium: 8.4 mg/dL — ABNORMAL LOW (ref 8.9–10.3)
Chloride: 104 mmol/L (ref 101–111)
Creatinine, Ser: 0.53 mg/dL (ref 0.44–1.00)
GFR calc non Af Amer: >60 mL/min (ref >60)
Glucose, Bld: 85 mg/dL (ref 65–99)
Potassium: 3.3 mmol/L — ABNORMAL LOW (ref 3.5–5.1)
SODIUM: 136 mmol/L (ref 135–145)

## 2015-07-15 MED ORDER — SODIUM CHLORIDE 0.9 % IJ SOLN
INTRAMUSCULAR | Status: AC
  Administered 2015-07-15: 10 mL
  Filled 2015-07-15: qty 10

## 2015-07-15 MED ORDER — PROMETHAZINE HCL 25 MG/ML IJ SOLN
25.0000 mg | Freq: Once | INTRAMUSCULAR | Status: AC
  Administered 2015-07-15: 25 mg via INTRAMUSCULAR
  Filled 2015-07-15: qty 1

## 2015-07-15 NOTE — Progress Notes (Signed)
Antepartum progress note  S: Still having problems with pain as well as nausea and vomiting.   Continues to have left flank pain. Min diet due to nausea.  O: NO FEVER. BP 112/72 mmHg  Pulse 70  Temp(Src) 98 F (36.7 C) (Oral)  Resp 20  Ht 5\' 3"  (1.6 m)  Wt 79.833 kg (176 lb)  BMI 31.18 kg/m2  SpO2 100%  LMP 05/01/2016T max since admission was 99.7.  Abdomen: generalized lower abdominal tenderness. Neg Murphys.  Neg rebound/guarding. No epig or RUQ T. +Left CVAT, negative right CVAT No calf tenderness UOP 800 mL/24 hours.  A/P Pyelonephritis-continue IV Aztreonam.  Repeat US today as sx's not improving as of yet (still within 48 hours).  Pt has h/o stones and would like to re look for that as well as to assess for left sided hydro/pyonephrosis Urine culture not helpful Nausea and vomiting:  Demerol for pain, and Zofran for nausea  Hoyt Koch, MD

## 2015-07-16 LAB — CHLAMYDIA/NGC RT PCR (ARMC ONLY)
Chlamydia Tr: NOT DETECTED
N gonorrhoeae: NOT DETECTED

## 2015-07-16 MED ORDER — SULFAMETHOXAZOLE-TRIMETHOPRIM 800-160 MG PO TABS
1.0000 | ORAL_TABLET | Freq: Two times a day (BID) | ORAL | Status: DC
  Administered 2015-07-16: 1 via ORAL
  Filled 2015-07-16: qty 1

## 2015-07-16 MED ORDER — OXYCODONE-ACETAMINOPHEN 5-325 MG PO TABS: 1.0000 | ORAL_TABLET | Freq: Four times a day (QID) | ORAL | Status: DC | PRN

## 2015-07-16 MED ORDER — METRONIDAZOLE 0.75 % VA GEL: 1.0000 | Freq: Every day | VAGINAL | Status: DC

## 2015-07-16 MED ORDER — SULFAMETHOXAZOLE-TRIMETHOPRIM 800-160 MG PO TABS: 1.0000 | ORAL_TABLET | Freq: Two times a day (BID) | ORAL | Status: DC

## 2015-07-16 MED ORDER — PROMETHAZINE HCL 12.5 MG PO TABS: 12.5000 mg | ORAL_TABLET | ORAL | Status: DC | PRN

## 2015-07-16 MED ORDER — SCOPOLAMINE 1 MG/3DAYS TD PT72
1.0000 | MEDICATED_PATCH | TRANSDERMAL | Status: DC
Start: 2015-07-16 — End: 2015-12-08

## 2015-07-16 MED ORDER — LACTATED RINGERS IV SOLN: INTRAVENOUS | Status: DC

## 2015-07-16 MED ORDER — PROMETHAZINE HCL 25 MG PO TABS
12.5000 mg | ORAL_TABLET | ORAL | Status: DC | PRN
  Administered 2015-07-16: 12.5 mg via ORAL
  Filled 2015-07-16: qty 1

## 2015-07-16 MED ORDER — METRONIDAZOLE 0.75 % VA GEL
1.0000 | Freq: Every day | VAGINAL | Status: DC
  Administered 2015-07-16: 1 via VAGINAL
  Filled 2015-07-16: qty 70

## 2015-07-16 NOTE — Discharge Instructions (Signed)
°  Eating Plan for Hyperemesis Gravidarum Severe cases of hyperemesis gravidarum can lead to dehydration and malnutrition. The hyperemesis eating plan is one way to lessen the symptoms of nausea and vomiting. It is often used with prescribed medicines to control your symptoms.  WHAT CAN I DO TO RELIEVE MY SYMPTOMS? Listen to your body. Everyone is different and has different preferences. Find what works best for you. Some of the following things may help:  Eat and drink slowly.  Eat 5-6 small meals daily instead of 3 large meals.   Eat crackers before you get out of bed in the morning.   Starchy foods are usually well tolerated (such as cereal, toast, bread, potatoes, pasta, rice, and pretzels).   Ginger may help with nausea. Add  tsp ground ginger to hot tea or choose ginger tea.   Try drinking 100% fruit juice or an electrolyte drink.  Continue to take your prenatal vitamins as directed by your health care provider. If you are having trouble taking your prenatal vitamins, talk with your health care provider about different options.  Include at least 1 serving of protein with your meals and snacks (such as meats or poultry, beans, nuts, eggs, or yogurt). Try eating a protein-rich snack before bed (such as cheese and crackers or a half [place] or peanut butter sandwich). WHAT THINGS SHOULD I AVOID TO REDUCE MY SYMPTOMS? The following things may help reduce your symptoms:  Avoid foods with strong smells. Try eating meals in well-ventilated areas that are free of odors.  Avoid drinking water or other beverages with meals. Try not to drink anything less than 30 minutes before and after meals.  Avoid drinking more than 1 cup of fluid at a time.  Avoid fried or high-fat foods, such as butter and cream sauces.  Avoid spicy foods.  Avoid skipping meals the best you can. Nausea can be more intense on an empty stomach. If you cannot tolerate food at that time, do not force it. Try sucking  on ice chips or other frozen items and make up the calories later.  Avoid lying down within 2 hours after eating. Document Released: 09/19/2007 Document Revised: 11/27/2013 Document Reviewed: 09/26/2013 [facility] Patient Information 2015 [place], [place]. This information is not intended to replace advice given to you by your health care provider. Make sure you discuss any questions you have with your health care provider.  A general rule for staying hydrated is to drink 1-2 L of fluid per day. Talk to your health care provider about the specific amount you should be drinking each day. Drink enough fluids to keep your urine clear or pale yellow. Document Released: 02/12/2004 Document Revised: 11/27/2013 Document Reviewed: 10/15/2013 [facility] Patient Information 2015 [place], [place]. This information is not intended to replace advice given to you by your health care provider. Make sure you discuss any questions you have with your health care provider.

## 2015-07-16 NOTE — Progress Notes (Signed)
Antepartum progress note: PAD #3  S: Wants to go home. Still having pain in back and lower abdomen, but slightly decreased. Able to keep down Percocet with Phenergan this AM. Having continuing problems with nausea-vomited yesterday and placed back on clear liquids. This Am was able to tolerate a BRAT/ bland diet after IV Phenergan.  C/O dysuria and vulvar irritation this AM.  O: BP 120/78 mmHg  Pulse 71  Temp(Src) 98.3 F (36.8 C) (Oral)  Resp 18  Ht 5\' 3"  (1.6 m)  Wt 176 lb (79.833 kg)  BMI 31.18 kg/m2  SpO2 100%  LMP 04/06/2015  afebrile x 48hours Urine culture grew multiple bacteria Blood culture negative. WBC WNL Repeat kidney ultrasound: no stones, no hydroureter, no obstruction General: awake, alert. Appears more comfortable than 2 days ago FHT=155 Vulva: erythematous vestibule Vagina: off white discharge Wet prep: positive for clue cells, negative for hyphae and Trich  A/P PAD #3 for pyelonephritis- slowly decreasing flank pain on left on current antibiotic. Urine culture-not helpful. No sensitivities. Will discuss po antibiotic selection with MD. PCN allergy with rash and swelling in throat. K PAD for back. May shower.  Nausea and vomiting: able to keep down solid foods for breakfast. Needs to be able to keep down antibiotics and pain meds prior to discharge. Will switch to po phenergan also Bacterial vaginosis-start Metrogel. Get Aptima  Dalia Heading, CNM

## 2015-07-16 NOTE — Progress Notes (Signed)
Patient discharged home. Discharge instructions, prescriptions and follow up appointment given to and reviewed with patient. Patient verbalized understanding. Escorted out by Perley Jain, RN.

## 2015-07-16 NOTE — Discharge Summary (Signed)
OB Discharge Summary  Patient Name: Brenda Todd DOB: 04/11/1981 MRN: [id number]  Date of admission: 07/13/2015     Date of discharge:07/16/2015 Admitting diagnosis: Left flank pain. Nausea and vomiting     Suspected pyelonephritis  Secondary diagnosis: IUP at 13.[redacted] weeks gestation    Discharge diagnosis: Left pyelonephritis. Bacterial vaginosis                                                                      Hospital course:  Patient admitted on 8 August with fever, left flank pain, nausea/vomiting and TNTC WBC and RBC in urine. She was admitted with possible left pyelonephritis and was begun on antiemetics, analgesics and IV aztreonam. Kidney ultrasounds x 2 were normal with no evidence of stones, hydroureter, or obstruction. She was gradually able to keep down fluids then food with IV then oral phenergan. Initially she needed IV narcotic analgesics, but was able to keep down Percocet for her flank pain prior to discharge. Urine culture grew out multiple bacteria, so sensitivities were not available, but her fever resolved and her white blood cell count became normal on the IV aztreonam.  She was discharged on Bactrim DS on post admission day 3 after being afebrile for greater than 48 hours. She was also diagnosed with bacterial vaginosis on day of discharge and prescribed Metrogel vaginal gel.  She is to follow up with [facility] on 15 August.   Physical exam  Filed Vitals:   07/16/15 0405 07/16/15 0741 07/16/15 1124 07/16/15 1643  BP: 119/73 120/78 129/73 121/74  Pulse: 69 71 77 80  Temp: 97.7 F (36.5 C) 98.3 F (36.8 C) 98 F (36.7 C) 98.9 F (37.2 C)  TempSrc: Oral Oral Oral Oral  Resp: 18 18 18 18   Height:      Weight:      SpO2: 100% 100%     General: alert, awake in no acute distress. Tolerating regular/bland diet  Left CVAT present. No Right CVAT Abdomen soft, FH at U-2FB. FHT 155 DVT Evaluation: No evidence of DVT seen on physical exam. Labs: Lab Results   Component Value Date   WBC 10.5 07/15/2015   HGB 12.2 07/15/2015   HCT 36.0 07/15/2015   MCV 81.0 07/15/2015   PLT 162 07/15/2015   CMP Latest Ref Rng 07/15/2015  Glucose 65 - 99 mg/dL 85  BUN 6 - 20 mg/dL 5(L)  Creatinine 0.44 - 1.00 mg/dL 0.53  Sodium 135 - 145 mmol/L 136  Potassium 3.5 - 5.1 mmol/L 3.3(L)  Chloride 101 - 111 mmol/L 104  CO2 22 - 32 mmol/L 26  Calcium 8.9 - 10.3 mg/dL 8.4(L)  Total Protein 6.5 - 8.1 g/dL -  Total Bilirubin 0.3 - 1.2 mg/dL -  Alkaline Phos 38 - 126 U/L -  AST 15 - 41 U/L -  ALT 14 - 54 U/L -      Medications:   Medication List    STOP taking these medications        ondansetron 4 MG tablet  Commonly known as:  ZOFRAN      TAKE these medications        metroNIDAZOLE 0.75 % vaginal gel  Commonly known as:  METROGEL  Place 1 Applicatorful vaginally  at bedtime.     oxyCODONE-acetaminophen 5-325 MG per tablet  Commonly known as:  PERCOCET/ROXICET  Take 1-2 tablets by mouth every 6 (six) hours as needed for moderate pain.     prenatal multivitamin Tabs tablet  Take 1 tablet by mouth daily at 12 noon.     promethazine 12.5 MG tablet  Commonly known as:  PHENERGAN  Take 1 tablet (12.5 mg total) by mouth every 4 (four) hours as needed for nausea or vomiting.     scopolamine 1 MG/3DAYS  Commonly known as:  TRANSDERM-SCOP (1.5 MG)  Place 1 patch (1.5 mg total) onto the skin every 3 (three) days.     sulfamethoxazole-trimethoprim 800-160 MG per tablet  Commonly known as:  BACTRIM DS,SEPTRA DS  Take 1 tablet by mouth 2 (two) times daily.        Diet: Bland diet, advance as tolerated  Activity: Advance as tolerated. Pelvic rest for 6 weeks.   Outpatient follow PY:[id number] August 15 at [facility]  07/16/2015 Dalia Heading, CNM

## 2015-07-18 LAB — CULTURE, BLOOD (ROUTINE X 2)
CULTURE: NO GROWTH
Culture: NO GROWTH

## 2015-08-06 ENCOUNTER — Inpatient Hospital Stay
Admission: EM | Admit: 2015-08-06 | Discharge: 2015-08-08 | DRG: 781 | Disposition: A | Discharge status: Home / Self Care | Payer: Medicaid Other | Attending: Obstetrics and Gynecology | Admitting: Obstetrics and Gynecology

## 2015-08-06 ENCOUNTER — Encounter: Payer: Self-pay | Admitting: Emergency Medicine

## 2015-08-06 DIAGNOSIS — N159 Renal tubulo-interstitial disease, unspecified: Secondary | ICD-10-CM | POA: Diagnosis present

## 2015-08-06 DIAGNOSIS — Z3A17 17 weeks gestation of pregnancy: Secondary | ICD-10-CM | POA: Diagnosis present

## 2015-08-06 DIAGNOSIS — O09292 Supervision of pregnancy with other poor reproductive or obstetric history, second trimester: Secondary | ICD-10-CM

## 2015-08-06 DIAGNOSIS — Z880 Allergy status to penicillin: Secondary | ICD-10-CM

## 2015-08-06 DIAGNOSIS — O2302 Infections of kidney in pregnancy, second trimester: Principal | ICD-10-CM | POA: Diagnosis present

## 2015-08-06 DIAGNOSIS — Z87442 Personal history of urinary calculi: Secondary | ICD-10-CM

## 2015-08-06 DIAGNOSIS — Z87440 Personal history of urinary (tract) infections: Secondary | ICD-10-CM

## 2015-08-06 DIAGNOSIS — Z87891 Personal history of nicotine dependence: Secondary | ICD-10-CM

## 2015-08-06 LAB — URINALYSIS COMPLETE WITH MICROSCOPIC (ARMC ONLY)
BILIRUBIN URINE: NEGATIVE
Glucose, UA: NEGATIVE mg/dL
Ketones, ur: NEGATIVE mg/dL
Nitrite: NEGATIVE
PH: 6 (ref 5.0–8.0)
Protein, ur: 100 mg/dL — AB
Specific Gravity, Urine: 1.018 (ref 1.005–1.030)

## 2015-08-06 LAB — BASIC METABOLIC PANEL
Anion gap: 8 (ref 5–15)
BUN: 10 mg/dL (ref 6–20)
CALCIUM: 8.7 mg/dL — AB (ref 8.9–10.3)
CO2: 22 mmol/L (ref 22–32)
Chloride: 107 mmol/L (ref 101–111)
Creatinine, Ser: 0.67 mg/dL (ref 0.44–1.00)
GFR calc Af Amer: >60 mL/min (ref >60)
GFR calc non Af Amer: >60 mL/min (ref >60)
Glucose, Bld: 121 mg/dL — ABNORMAL HIGH (ref 65–99)
Potassium: 3.7 mmol/L (ref 3.5–5.1)
Sodium: 137 mmol/L (ref 135–145)

## 2015-08-06 LAB — CBC
HCT: 37.8 % (ref 35.0–47.0)
Hemoglobin: 12.7 g/dL (ref 12.0–16.0)
MCH: 27.1 pg (ref 26.0–34.0)
MCHC: 33.4 g/dL (ref 32.0–36.0)
MCV: 81 fL (ref 80.0–100.0)
Platelets: 219 10*3/uL (ref 150–440)
RBC: 4.67 MIL/uL (ref 3.80–5.20)
RDW: 14.2 % (ref 11.5–14.5)
WBC: 11.1 10*3/uL — ABNORMAL HIGH (ref 3.6–11.0)

## 2015-08-06 MED ORDER — PROMETHAZINE HCL 25 MG/ML IJ SOLN
12.5000 mg | Freq: Once | INTRAMUSCULAR | Status: AC
  Administered 2015-08-06: 12.5 mg via INTRAVENOUS
  Filled 2015-08-06: qty 1

## 2015-08-06 MED ORDER — HYDROMORPHONE HCL 1 MG/ML IJ SOLN
0.5000 mg | INTRAMUSCULAR | Status: AC | PRN
  Administered 2015-08-06 (×2): 0.5 mg via INTRAVENOUS
  Filled 2015-08-06 (×2): qty 1

## 2015-08-06 MED ORDER — ONDANSETRON HCL 4 MG/2ML IJ SOLN: 4.0000 mg | Freq: Four times a day (QID) | INTRAMUSCULAR | Status: DC | PRN

## 2015-08-06 MED ORDER — DEXTROSE 5 % IV SOLN
1.0000 g | Freq: Three times a day (TID) | INTRAVENOUS | Status: DC
  Administered 2015-08-06 – 2015-08-08 (×5): 1 g via INTRAVENOUS
  Filled 2015-08-06 (×10): qty 1

## 2015-08-06 MED ORDER — DEXTROSE 5 % IV SOLN: 1.0000 g | Freq: Three times a day (TID) | INTRAVENOUS | Status: DC

## 2015-08-06 MED ORDER — ACETAMINOPHEN 325 MG PO TABS: 650.0000 mg | ORAL_TABLET | ORAL | Status: DC | PRN

## 2015-08-06 MED ORDER — AZTREONAM 1 G IJ SOLR
1.0000 g | INTRAMUSCULAR | Status: AC
Start: 2015-08-06 — End: 2015-08-06
  Administered 2015-08-06: 1 g via INTRAVENOUS
  Filled 2015-08-06: qty 1

## 2015-08-06 MED ORDER — DEXTROSE 5 % IV SOLN: 1.0000 g | Freq: Three times a day (TID) | INTRAVENOUS | Status: AC

## 2015-08-06 MED ORDER — OXYCODONE-ACETAMINOPHEN 5-325 MG PO TABS
1.0000 | ORAL_TABLET | Freq: Four times a day (QID) | ORAL | Status: DC | PRN
  Administered 2015-08-06 – 2015-08-08 (×8): 2 via ORAL
  Filled 2015-08-06 (×8): qty 2

## 2015-08-06 MED ORDER — CALCIUM CARBONATE ANTACID 500 MG PO CHEW: 2.0000 | CHEWABLE_TABLET | ORAL | Status: DC | PRN

## 2015-08-06 MED ORDER — PRENATAL MULTIVITAMIN CH
1.0000 | ORAL_TABLET | Freq: Every day | ORAL | Status: DC
  Administered 2015-08-06 – 2015-08-08 (×3): 1 via ORAL
  Filled 2015-08-06 (×3): qty 1

## 2015-08-06 MED ORDER — SODIUM CHLORIDE 0.9 % IV SOLN
INTRAVENOUS | Status: DC
  Administered 2015-08-06 – 2015-08-08 (×6): via INTRAVENOUS

## 2015-08-06 MED ORDER — SODIUM CHLORIDE 0.9 % IV BOLUS (SEPSIS)
1000.0000 mL | Freq: Once | INTRAVENOUS | Status: AC
  Administered 2015-08-06: 1000 mL via INTRAVENOUS

## 2015-08-06 NOTE — ED Provider Notes (Signed)
[facility] Emergency Department Provider Note  ____________________________________________  Time seen: 12:35 PM  I have reviewed the triage vital signs and the nursing notes.   HISTORY  Chief Complaint Flank Pain  history of polynephritis    HPI Brenda Todd is a 34 y.o. female who is [redacted] weeks pregnant. She was admitted to the hospital approximately 3 weeks for pyelonephritis on the left. She now has pain starting on the left flank. This began yesterday. She had an appointment at the health department today and was sent to the emergency department for further assistance in evaluation.  She reports being nauseous but hungry. She has taken Phenergan at home but has been unable to keep the tablet down.   She denies any fever.   Past Medical History  Diagnosis Date  . Kidney calculi   . UTI (lower urinary tract infection)     Patient Active Problem List   Diagnosis Date Noted  . Kidney infection 08/06/2015  . Pyelonephritis affecting pregnancy in second trimester 07/13/2015  . History of stillbirth 07/10/2015    Past Surgical History  Procedure Laterality Date  . Appendectomy    . Lithotripsy      Current Outpatient Rx  Name  Route  Sig  Dispense  Refill  . metroNIDAZOLE (METROGEL) 0.75 % vaginal gel   Vaginal   Place 1 Applicatorful vaginally at bedtime.   70 g   0   . oxyCODONE-acetaminophen (PERCOCET/ROXICET) 5-325 MG per tablet   Oral   Take 1-2 tablets by mouth every 6 (six) hours as needed for moderate pain.   30 tablet   0   . Prenatal Vit-Fe Fumarate-FA (PRENATAL MULTIVITAMIN) TABS tablet   Oral   Take 1 tablet by mouth daily at 12 noon.         . promethazine (PHENERGAN) 12.5 MG tablet   Oral   Take 1 tablet (12.5 mg total) by mouth every 4 (four) hours as needed for nausea or vomiting.   30 tablet   0   . scopolamine (TRANSDERM-SCOP, 1.5 MG,) 1 MG/3DAYS   Transdermal   Place 1 patch (1.5 mg total) onto the skin  every 3 (three) days.   3 patch   3     Do not use with Phenergan   . sulfamethoxazole-trimethoprim (BACTRIM DS,SEPTRA DS) 800-160 MG per tablet   Oral   Take 1 tablet by mouth 2 (two) times daily.   14 tablet   0     Allergies Penicillins  History reviewed. No pertinent family history.  Social History Social History  Substance Use Topics  . Smoking status: Former Research scientist (life sciences)  . Smokeless tobacco: Never Used  . Alcohol Use: No    Review of Systems  Constitutional: Negative for fever. ENT: Negative for sore throat. Cardiovascular: Negative for chest pain. Respiratory: Negative for shortness of breath. Gastrointestinal: Gravid. Nausea and vomiting. Left flank pain. See history of present illness Genitourinary: Negative for dysuria. Musculoskeletal: No myalgias or injuries. Skin: Negative for rash. Neurological: Negative for headaches   10-point ROS otherwise negative.  ____________________________________________   PHYSICAL EXAM:  VITAL SIGNS: ED Triage Vitals  Enc Vitals Group     BP 08/06/15 1150 144/86 mmHg     Pulse Rate 08/06/15 1150 92     Resp 08/06/15 1150 16     Temp 08/06/15 1150 98.2 F (36.8 C)     Temp src --      SpO2 08/06/15 1150 98 %  Weight 08/06/15 1150 172 lb (78.019 kg)     Height --      Head Cir --      Peak Flow --      Pain Score 08/06/15 1151 9     Pain Loc --      Pain Edu? --      Excl. in [place]? --     Constitutional: Alert and oriented. Well-nourished well-developed. No acute distress. ENT   Head: Normocephalic and atraumatic.   Nose: No congestion/rhinnorhea.   Mouth/Throat: Mucous membranes are moist. Cardiovascular: Normal rate, regular rhythm, no murmur noted Respiratory:  Normal respiratory effort, no tachypnea.    Breath sounds are clear and equal bilaterally.  Gastrointestinal: Soft with tenderness in the left abdomen and left flank.. No distention.  Back: Patient does have tenderness in the left flank  on palpation as well as percussion. Positive CVA tenderness.. Musculoskeletal: No deformity noted. Nontender with normal range of motion in all extremities.  No noted edema. Neurologic:  Normal speech and language. No gross focal neurologic deficits are appreciated.  Skin:  Skin is warm, dry. No rash noted. Psychiatric: Mood and affect are normal. Speech and behavior are norma.  ____________________________________________    LABS (pertinent positives/negatives)  Labs Reviewed  BASIC METABOLIC PANEL - Abnormal; Notable for the following:    Glucose, Bld 121 (*)    Calcium 8.7 (*)    All other components within normal limits  CBC - Abnormal; Notable for the following:    WBC 11.1 (*)    All other components within normal limits  URINALYSIS COMPLETEWITH MICROSCOPIC ([facility] ONLY) - Abnormal; Notable for the following:    Color, Urine RED (*)    APPearance CLOUDY (*)    Hgb urine dipstick 3+ (*)    Protein, ur 100 (*)    Leukocytes, UA 3+ (*)    Bacteria, UA MANY (*)    Squamous Epithelial / LPF 6-30 (*)    All other components within normal limits  URINE CULTURE     ____________________________________________    INITIAL IMPRESSION / ASSESSMENT AND PLAN / ED COURSE  Pertinent labs & imaging results that were available during my care of the patient were reviewed by me and considered in my medical decision making (see chart for details).   Pleasant 34 year old female who is [redacted] weeks pregnant with likely pyelonephritis. She has left flank pain. Her urine results are back already and show white blood cells and red blood cells, both to numerous to count, and leukocyte esterase at 3+.  The patient reports she is allergic to penicillin. The last time she had a reaction was in 2010. She reports she had swelling and itching when she gets penicillin. We discussed the risks of ceftriaxone. She is willing to try this as an option. I've explained that the risk of cross-reactivity is fairly  low, but given her gravid state, I feel it is still best to utilize other antibiotics currently. We will initiate aztreonam again, which is what the patient was treated with in the hospital 3 weeks ago.   ----------------------------------------- 1:48 PM on 08/06/2015 -----------------------------------------  I spoke with Dr. Star Age about this patient's condition. He will admit her to the hospital for ongoing treatment for her pyelonephritis while pregnant with a complicated treatment plan due to her penicillin allergy.   ____________________________________________   FINAL CLINICAL IMPRESSION(S) / ED DIAGNOSES  Final diagnoses:  Pyelonephritis affecting pregnancy in second trimester  Kidney infection      Shanon Brow  Frances Nickels, MD 08/06/15 [phone]

## 2015-08-06 NOTE — ED Notes (Signed)
Pt here left flank pain and hematuria. Seen by OB MD.  Pt is [redacted] weeks pregnant. Pt has hx of UTI and kidney stones.

## 2015-08-06 NOTE — Consult Note (Signed)
ANTIBIOTIC CONSULT NOTE - INITIAL  Pharmacy Consult for aztreonam Indication: pyleonepritis in pregnancy  Allergies  Allergen Reactions  . Penicillins Rash    Patient Measurements: Weight: 172 lb (78.019 kg) Adjusted Body Weight:   Vital Signs: Temp: 98.2 F (36.8 C) (08/31 1150) BP: 136/89 mmHg (08/31 1230) Pulse Rate: 85 (08/31 1230) Intake/Output from previous day:   Intake/Output from this shift:    Labs:  Recent Labs  08/06/15 1156  WBC 11.1*  HGB 12.7  PLT 219  CREATININE 0.67   Estimated Creatinine Clearance: 97.9 mL/min (by C-G formula based on Cr of 0.67). No results for input(s): VANCOTROUGH, VANCOPEAK, VANCORANDOM, GENTTROUGH, GENTPEAK, GENTRANDOM, TOBRATROUGH, TOBRAPEAK, TOBRARND, AMIKACINPEAK, AMIKACINTROU, AMIKACIN in the last 72 hours.   Microbiology: Recent Results (from the past 720 hour(s))  Blood culture (routine x 2)     Status: None   Collection Time: 07/13/15  2:10 PM  Result Value Ref Range Status   Specimen Description BLOOD RIGHT ARM  Final   Special Requests   Final    BOTTLES DRAWN AEROBIC AND ANAEROBIC  AER 6CC ANA 4CC   Culture NO GROWTH 5 DAYS  Final   Report Status 07/18/2015 FINAL  Final  Urine culture     Status: None   Collection Time: 07/13/15  2:10 PM  Result Value Ref Range Status   Specimen Description URINE, CLEAN CATCH  Final   Special Requests Normal  Final   Culture MULTIPLE SPECIES PRESENT, SUGGEST RECOLLECTION  Final   Report Status 07/14/2015 FINAL  Final  Blood culture (routine x 2)     Status: None   Collection Time: 07/13/15  4:03 PM  Result Value Ref Range Status   Specimen Description BLOOD RIGHT ANTECUBITAL  Final   Special Requests BOTTLES DRAWN AEROBIC AND ANAEROBIC 10ML  Final   Culture NO GROWTH 5 DAYS  Final   Report Status 07/18/2015 FINAL  Final  Chlamydia/NGC rt PCR ([facility] only)     Status: None   Collection Time: 07/16/15 10:49 AM  Result Value Ref Range Status   Specimen source GC/Chlam URINE,  RANDOM  Final   Chlamydia Tr NOT DETECTED NOT DETECTED Final   N gonorrhoeae NOT DETECTED NOT DETECTED Final    Comment: (NOTE) 100  This methodology has not been evaluated in pregnant women or in 200  patients with a history of hysterectomy. 300 400  This methodology will not be performed on patients less than 19  years of age.     Medical History: Past Medical History  Diagnosis Date  . Kidney calculi   . UTI (lower urinary tract infection)     Medications:  Scheduled:   Assessment: Pt is a 34 year old female, [redacted] weeks pregnant who presents with severe flank pain consistent for pyleonepritis. Pt has penicillin alergry. Pharmacy consulted to dose aztreonam  Goal of Therapy:  resolution of infection  Plan:  Aztreonam 1 g q 8 hours  Melissa D Maccia 08/06/2015,1:48 PM

## 2015-08-06 NOTE — ED Notes (Signed)
MD at bedside. 

## 2015-08-06 NOTE — H&P (Signed)
Obstetrics & Gynecology Consultation Note  Date of Consultation: 08/06/2015   Requesting Provider: [facility] ER  Primary OBGYN: [facility]  Primary Care Provider: No PCP Per Patient  Reason for Consultation: Left Flank Pain  History of Present Illness: Brenda Todd is a 34 y.o. G2P0010 at 4w1dby Estimated Date of Delivery: 01/13/16 second presentation this month for left flank pain and suspected pylonephritis.  Was admitted treated with aztreonam and discharged on bactrim.  Finished bactrim but was not transitioned to po macrobid for continued prophylaxis.  Current symptoms started yesterday evening.  [facility] 7 months IUFD secondary to congestive heart failure.  Culture at time of last visit was non-diagnostic  ROS: A 12-point review of systems was performed and negative, except as stated in the above HPI.  OBGYN History: As per HPI. OB History    Gravida Para Term Preterm AB TAB SAB Ectopic Multiple Living   2 0 0 0   1   0     No. history of abnormal pap smears (last pap smear: 2 mos, which was normal.) no history of STIs.   She is currently using not using for contraception.  no HRT use.    Past Medical History: Past Medical History  Diagnosis Date  . Kidney calculi   . UTI (lower urinary tract infection)     Past Surgical History: Past Surgical History  Procedure Laterality Date  . Appendectomy    . Lithotripsy      Family History:  History reviewed. No pertinent family history. She denies any female cancers, bleeding or blood clotting disorders.   Social History:  Social History   Social History  . Marital Status: Divorced    Spouse Name: N/A  . Number of Children: N/A  . Years of Education: N/A   Occupational History  . Not on file.   Social History Main Topics  . Smoking status: Former SResearch scientist (life sciences) . Smokeless tobacco: Never Used  . Alcohol Use: No  . Drug Use: No  . Sexual  Activity: Yes    Birth Control/ Protection: Condom   Other Topics Concern  . Not on file   Social History Narrative    Health Maintenance:  Mammogram no, which was done in never;  Colonoscopy no never,  Flu shot no    Allergy: Allergies  Allergen Reactions  . Penicillins Rash     Medications: Prior to Admission medications   Medication Sig Start Date End Date Taking? Authorizing Provider  metroNIDAZOLE (METROGEL) 0.75 % vaginal gel Place 1 Applicatorful vaginally at bedtime. 07/16/15  Yes CDalia Heading CNM  oxyCODONE-acetaminophen (PERCOCET/ROXICET) 5-325 MG per tablet Take 1-2 tablets by mouth every 6 (six) hours as needed for moderate pain. 07/16/15  Yes CDalia Heading CNM  Prenatal Vit-Fe Fumarate-FA (PRENATAL MULTIVITAMIN) TABS tablet Take 1 tablet by mouth daily at 12 noon.   Yes Historical Provider, MD  promethazine (PHENERGAN) 12.5 MG tablet Take 1 tablet (12.5 mg total) by mouth every 4 (four) hours as needed for nausea or vomiting. 07/16/15  Yes CDalia Heading CNM  scopolamine (TRANSDERM-SCOP, 1.5 MG,) 1 MG/3DAYS Place 1 patch (1.5 mg total) onto the skin every 3 (three) days. 07/16/15  Yes CDalia Heading CNM  sulfamethoxazole-trimethoprim (BACTRIM DS,SEPTRA DS) 800-160 MG per tablet Take 1 tablet by mouth 2 (two) times daily. 07/16/15  Yes CDalia Heading CNM    Physical Exam Vitals: Blood pressure 136/89, pulse 85, temperature 98.2  F (36.8 C), resp. rate 16, weight 78.019 kg (172 lb), last menstrual period 04/06/2015, SpO2 97 %. General: NAD HEENT: Normocephalic, anicteric Pulmonary: no increased work of breathing Cardiovascular: RRR Abdomen: NABS, soft, mild suprapubic tenderness Genitourinary: deferred Extremities: no edema MSK: left CVA tenderness Neurologic: CN II-XII grossly intact Psychiatric: A&O x 3, mood appropriate, affect full  Labs: Results for orders placed or performed during the hospital encounter of 08/06/15 (from the past 72  hour(s))  Urinalysis complete, with microscopic- may I&O cath if menses [facility] only)     Status: Abnormal   Collection Time: 08/06/15 11:55 AM  Result Value Ref Range   Color, Urine RED (A) YELLOW   APPearance CLOUDY (A) CLEAR   Glucose, UA NEGATIVE NEGATIVE mg/dL   Bilirubin Urine NEGATIVE NEGATIVE   Ketones, ur NEGATIVE NEGATIVE mg/dL   Specific Gravity, Urine 1.018 1.005 - 1.030   Hgb urine dipstick 3+ (A) NEGATIVE   pH 6.0 5.0 - 8.0   Protein, ur 100 (A) NEGATIVE mg/dL   Nitrite NEGATIVE NEGATIVE   Leukocytes, UA 3+ (A) NEGATIVE   RBC / HPF TOO NUMEROUS TO COUNT 0 - 5 RBC/hpf   WBC, UA TOO NUMEROUS TO COUNT 0 - 5 WBC/hpf   Bacteria, UA MANY (A) NONE SEEN   Squamous Epithelial / LPF 6-30 (A) NONE SEEN   WBC Clumps PRESENT    Mucous PRESENT   Basic metabolic panel     Status: Abnormal   Collection Time: 08/06/15 11:56 AM  Result Value Ref Range   Sodium 137 135 - 145 mmol/L   Potassium 3.7 3.5 - 5.1 mmol/L   Chloride 107 101 - 111 mmol/L   CO2 22 22 - 32 mmol/L   Glucose, Bld 121 (H) 65 - 99 mg/dL   BUN 10 6 - 20 mg/dL   Creatinine, Ser 0.67 0.44 - 1.00 mg/dL   Calcium 8.7 (L) 8.9 - 10.3 mg/dL   GFR calc non Af Amer >60 >60 mL/min   GFR calc Af Amer >60 >60 mL/min    Comment: (NOTE) The eGFR has been calculated using the CKD EPI equation. This calculation has not been validated in all clinical situations. eGFR's persistently <60 mL/min signify possible Chronic Kidney Disease.    Anion gap 8 5 - 15  CBC     Status: Abnormal   Collection Time: 08/06/15 11:56 AM  Result Value Ref Range   WBC 11.1 (H) 3.6 - 11.0 K/uL   RBC 4.67 3.80 - 5.20 MIL/uL   Hemoglobin 12.7 12.0 - 16.0 g/dL   HCT 37.8 35.0 - 47.0 %   MCV 81.0 80.0 - 100.0 fL   MCH 27.1 26.0 - 34.0 pg   MCHC 33.4 32.0 - 36.0 g/dL   RDW 14.2 11.5 - 14.5 %   Platelets 219 150 - 440 K/uL    Imaging US Renal  07/15/2015   CLINICAL DATA:  UTI.  Left flank pain.  EXAM: RENAL / URINARY TRACT ULTRASOUND COMPLETE   COMPARISON:  None.  FINDINGS: Right Kidney:  Length: 11.8 cm. Echogenicity within normal limits. No mass or hydronephrosis visualized.  Left Kidney:  Length: 12.2 cm. Echogenicity within normal limits. No mass or hydronephrosis visualized.  Bladder:  Appears normal for degree of bladder distention.  IMPRESSION: Negative exam.   Electronically Signed   By: Marcello Moores  Register   On: 07/15/2015 09:14   US Renal  07/13/2015   CLINICAL DATA:  35 year old female with a history of left flank pain for 24  hours.  History of current pregnancy.  EXAM: RENAL / URINARY TRACT ULTRASOUND COMPLETE  COMPARISON:  None.  FINDINGS: Right Kidney:  Length: 11.9 cm. Echogenicity within normal limits. No mass or hydronephrosis visualized.  Left Kidney:  Length: 11.9 cm. Echogenicity within normal limits. No mass or hydronephrosis visualized.  Bladder:  Appears normal for degree of bladder distention.  Incidental imaging of gravid uterus.  IMPRESSION: Unremarkable sonographic survey of the bilateral kidneys.  Signed,  Dulcy Fanny. Earleen Newport, DO  [facility]  [facility]   Electronically Signed   By: Corrie Mckusick D.O.   On: 07/13/2015 14:51    Assessment: Ms. Kuznia is a 34 y.o. G2P0010 at 52w1dby Estimated Date of Delivery: 01/13/16 ED with complaints of severe left flank pain, chills, nausea, hematuria; findings are consistent with pylelonephritis in second trimester.  Plan: - IVF - Aztreonam due to allergy - percocet for pain - length of stay pending resolution of CVA tenderness

## 2015-08-07 DIAGNOSIS — Z87442 Personal history of urinary calculi: Secondary | ICD-10-CM | POA: Diagnosis not present

## 2015-08-07 DIAGNOSIS — Z87891 Personal history of nicotine dependence: Secondary | ICD-10-CM | POA: Diagnosis not present

## 2015-08-07 DIAGNOSIS — O2302 Infections of kidney in pregnancy, second trimester: Secondary | ICD-10-CM | POA: Diagnosis present

## 2015-08-07 DIAGNOSIS — Z87440 Personal history of urinary (tract) infections: Secondary | ICD-10-CM | POA: Diagnosis not present

## 2015-08-07 DIAGNOSIS — Z3A17 17 weeks gestation of pregnancy: Secondary | ICD-10-CM | POA: Diagnosis present

## 2015-08-07 DIAGNOSIS — O09292 Supervision of pregnancy with other poor reproductive or obstetric history, second trimester: Secondary | ICD-10-CM | POA: Diagnosis not present

## 2015-08-07 DIAGNOSIS — Z880 Allergy status to penicillin: Secondary | ICD-10-CM | POA: Diagnosis not present

## 2015-08-07 LAB — URINE CULTURE

## 2015-08-07 NOTE — Progress Notes (Signed)
Benign Gynecology Progress Note  Admission Date: 08/06/2015 Current Date: 08/07/2015  HARBOUR NORDMEYER is a 34 y.o. G2P1000 HD#1 @ [redacted]w[redacted]d by dates/US for Pyelonephritis (second admission for this diagnosis) .  History complicated by: Patient Active Problem List   Diagnosis Date Noted  . Kidney infection 08/06/2015  . Pyelonephritis affecting pregnancy in second trimester 07/13/2015  . History of stillbirth 07/10/2015   ROS and patient/family/surgical history, located on admission H&P note dated 08/06/2015, have been reviewed, and there are no changes except as noted below  Yesterday/Overnight Events:  Still reports L FLANK PAIN.  Denies fever chills malaise.  Has appetite and energy.  Objective:   Filed Vitals:   08/06/15 1230 08/06/15 1600 08/06/15 1936 08/06/15 2357  BP: 136/89 122/69 119/66 129/74  Pulse: 85 72 73 75  Temp:  98.5 F (36.9 C) 97.7 F (36.5 C) 98 F (36.7 C)  TempSrc:  Oral Oral Oral  Resp: 16 16 18    Weight:      SpO2: 97% 99%        Current Vital Signs 24h Vital Sign Ranges  T 98 F (36.7 C) Temp  Avg: 98.1 F (36.7 C)  Min: 97.7 F (36.5 C)  Max: 98.5 F (36.9 C)  BP 129/74 mmHg BP  Min: 119/66  Max: 144/86  HR 75 Pulse  Avg: 79.4  Min: 72  Max: 92  RR 18 Resp  Avg: 16.5  Min: 16  Max: 18  SaO2 99 % Not Delivered SpO2  Avg: 98 %  Min: 97 %  Max: 99 %           24 Hour I/O Current Shift I/O  Time Ins Outs 08/31 0701 - 09/01 0700 In: 120 [P.O.:120] Out: 225 [Urine:225]      Physical exam: General appearance: alert, cooperative and appears stated age Abdomen: soft, non-tender; bowel sounds normal; no masses, no organomegaly GU: No gross VB BACK- LEFT SIDED CVAT as well as flank T Lungs: clear to auscultation bilaterally Heart: regular rate and rhythm and no MRGs Extremities: no redness or tenderness in the calves or thighs, no edema Skin: no lesions Psych: appropriate   Recent Labs Lab 08/06/15 1156  NA 137  K 3.7  CL 107  CO2 22   BUN 10  CREATININE 0.67  GLUCOSE 121*    Recent Labs Lab 08/06/15 1156  WBC 11.1*  HGB 12.7  HCT 37.8  PLT 219     Recent Labs Lab 08/06/15 1156  CALCIUM 8.7*   No results for input(s): INR, APTT in the last 168 hours.     Radiology none  Assessment & Plan:  17 weeks, Pyelo Prior treatment, but pt did not start daily prophylactic therapy after IV and PO course of treatment antibiotics. Will retreat w IV ABX, convert to po's when no fever or CVAT for 24-48 hours, and then continue daily ABX until delivery. PNV.  Barnett Applebaum, MD

## 2015-08-08 DIAGNOSIS — O2302 Infections of kidney in pregnancy, second trimester: Secondary | ICD-10-CM | POA: Diagnosis present

## 2015-08-08 MED ORDER — NITROFURANTOIN MONOHYD MACRO 100 MG PO CAPS: 100.0000 mg | ORAL_CAPSULE | Freq: Every day | ORAL | Status: DC

## 2015-08-08 MED ORDER — SULFAMETHOXAZOLE-TRIMETHOPRIM 800-160 MG PO TABS: 1.0000 | ORAL_TABLET | Freq: Two times a day (BID) | ORAL | Status: DC

## 2015-08-08 MED ORDER — HYDROCODONE-ACETAMINOPHEN 5-325 MG PO TABS: 1.0000 | ORAL_TABLET | Freq: Four times a day (QID) | ORAL | Status: DC | PRN

## 2015-08-08 NOTE — Discharge Summary (Signed)
Physician Discharge Summary  Patient ID: Brenda Todd MRN: [id number] DOB/AGE: [birth year] 34 y.o.  Admit date: 08/06/2015 Discharge date: 08/08/2015  Admission Diagnoses: 1) intrauterine pregnancy at [redacted]w[redacted]d, 2) acute pyelonephritis complicating pregnancy  Discharge Diagnoses:  Active Problems:   Kidney infection   Pyelonephritis affecting pregnancy in second trimester, antepartum   Discharged Condition: good  Hospital Course: Admitted for the above diagnosis.  She was started on Aztreonam and maintained on this until hospital day number three. She was afebrile throughout her entire hospital course.  Her left back pain was somewhat improved at the time of discharge.  However, she does not want to wait for complete resolution of her pain prior to discharge.  She denies chest pain, trouble breathing, vaginal bleeding, nausea, and vomiting.  She notices +FM.  Consults: None  Significant Diagnostic Studies: Urine culture showing multiple organisms  Treatments: IV hydration, antibiotics: aztreonam, and analgesia: po narcotics  Discharge Exam: Blood pressure 132/75, pulse 83, temperature 98.5 F (36.9 C), temperature source Oral, resp. rate 18, weight 172 lb (78.019 kg), last menstrual period 04/06/2015, SpO2 99 %. General appearance: alert and no distress Back: mild left CVAT Resp: clear to auscultation bilaterally Chest wall: no tenderness Cardio: regular rate and rhythm and systolic murmur: systolic ejection 2/6,  GI: soft, gravid, nontender Extremities: extremities normal, atraumatic, no cyanosis or edema and no edema, redness or tenderness in the calves or thighs  Disposition: 01-Home or Self Care  Discharge Instructions    Call MD for:  difficulty breathing, headache or visual disturbances    Complete by:  As directed      Call MD for:  persistant dizziness or light-headedness    Complete by:  As directed      Call MD for:  persistant nausea and vomiting    Complete by:  As  directed      Call MD for:  severe uncontrolled pain    Complete by:  As directed      Call MD for:  temperature >100.4    Complete by:  As directed      Diet general    Complete by:  As directed      Increase activity slowly    Complete by:  As directed             Medication List    STOP taking these medications        metroNIDAZOLE 0.75 % vaginal gel  Commonly known as:  METROGEL      TAKE these medications        nitrofurantoin (macrocrystal-monohydrate) 100 MG capsule  Commonly known as:  MACROBID  Take 1 capsule (100 mg total) by mouth at bedtime. Start once finished with Bactrim     oxyCODONE-acetaminophen 5-325 MG per tablet  Commonly known as:  PERCOCET/ROXICET  Take 1-2 tablets by mouth every 6 (six) hours as needed for moderate pain.     prenatal multivitamin Tabs tablet  Take 1 tablet by mouth daily at 12 noon.     promethazine 12.5 MG tablet  Commonly known as:  PHENERGAN  Take 1 tablet (12.5 mg total) by mouth every 4 (four) hours as needed for nausea or vomiting.     scopolamine 1 MG/3DAYS  Commonly known as:  TRANSDERM-SCOP (1.5 MG)  Place 1 patch (1.5 mg total) onto the skin every 3 (three) days.     sulfamethoxazole-trimethoprim 800-160 MG per tablet  Commonly known as:  BACTRIM DS,SEPTRA DS  Take 1 tablet by mouth  2 (two) times daily for 14 days.           Follow-up Information    Follow up with Will Bonnet, MD In 1 week.   Specialty:  Obstetrics and Gynecology   Why:  transfer care from [facility], per patient request   Contact information:   [address] [place] [address] [phone]       Signed: Will Bonnet, MD 08/08/2015, 11:10 AM

## 2015-09-02 ENCOUNTER — Observation Stay
Admission: EM | Admit: 2015-09-02 | Discharge: 2015-09-02 | Disposition: A | Discharge status: Home / Self Care | Payer: Medicaid Other | Attending: Obstetrics and Gynecology | Admitting: Obstetrics and Gynecology

## 2015-09-02 ENCOUNTER — Encounter: Payer: Self-pay | Admitting: *Deleted

## 2015-09-02 DIAGNOSIS — Z3A21 21 weeks gestation of pregnancy: Secondary | ICD-10-CM | POA: Insufficient documentation

## 2015-09-02 DIAGNOSIS — IMO0001 Reserved for inherently not codable concepts without codable children: Secondary | ICD-10-CM | POA: Diagnosis present

## 2015-09-02 DIAGNOSIS — O26892 Other specified pregnancy related conditions, second trimester: Principal | ICD-10-CM | POA: Insufficient documentation

## 2015-09-02 DIAGNOSIS — R030 Elevated blood-pressure reading, without diagnosis of hypertension: Secondary | ICD-10-CM | POA: Diagnosis present

## 2015-09-02 LAB — URINE DRUG SCREEN, QUALITATIVE (ARMC ONLY)
AMPHETAMINES, UR SCREEN: NOT DETECTED
BENZODIAZEPINE, UR SCRN: NOT DETECTED
Barbiturates, Ur Screen: NOT DETECTED
Cannabinoid 50 Ng, Ur ~~LOC~~: NOT DETECTED
Cocaine Metabolite,Ur ~~LOC~~: NOT DETECTED
MDMA (Ecstasy)Ur Screen: NOT DETECTED
METHADONE SCREEN, URINE: NOT DETECTED
Opiate, Ur Screen: NOT DETECTED
PHENCYCLIDINE (PCP) UR S: NOT DETECTED
TRICYCLIC, UR SCREEN: NOT DETECTED

## 2015-09-02 LAB — COMPREHENSIVE METABOLIC PANEL
ALBUMIN: 3.1 g/dL — AB (ref 3.5–5.0)
ALT: 53 U/L (ref 14–54)
AST: 31 U/L (ref 15–41)
Alkaline Phosphatase: 40 U/L (ref 38–126)
Anion gap: 8 (ref 5–15)
BILIRUBIN TOTAL: 0.4 mg/dL (ref 0.3–1.2)
BUN: 6 mg/dL (ref 6–20)
CHLORIDE: 104 mmol/L (ref 101–111)
CO2: 21 mmol/L — ABNORMAL LOW (ref 22–32)
CREATININE: 0.51 mg/dL (ref 0.44–1.00)
Calcium: 8.7 mg/dL — ABNORMAL LOW (ref 8.9–10.3)
GFR calc Af Amer: >60 mL/min (ref >60)
GLUCOSE: 86 mg/dL (ref 65–99)
POTASSIUM: 3.4 mmol/L — AB (ref 3.5–5.1)
Sodium: 133 mmol/L — ABNORMAL LOW (ref 135–145)
Total Protein: 6.3 g/dL — ABNORMAL LOW (ref 6.5–8.1)

## 2015-09-02 LAB — CBC
HEMATOCRIT: 35.2 % (ref 35.0–47.0)
Hemoglobin: 11.7 g/dL — ABNORMAL LOW (ref 12.0–16.0)
MCH: 27.1 pg (ref 26.0–34.0)
MCHC: 33.3 g/dL (ref 32.0–36.0)
MCV: 81.6 fL (ref 80.0–100.0)
PLATELETS: 188 10*3/uL (ref 150–440)
RBC: 4.32 MIL/uL (ref 3.80–5.20)
RDW: 14.5 % (ref 11.5–14.5)
WBC: 10.1 10*3/uL (ref 3.6–11.0)

## 2015-09-02 LAB — PROTEIN / CREATININE RATIO, URINE
Creatinine, Urine: 102 mg/dL
PROTEIN CREATININE RATIO: 0.89 mg/mg{creat} — AB (ref 0.00–0.15)
TOTAL PROTEIN, URINE: 91 mg/dL

## 2015-09-02 LAB — T4, FREE: Free T4: 0.71 ng/dL (ref 0.61–1.12)

## 2015-09-02 LAB — TSH: TSH: 1.422 u[IU]/mL (ref 0.350–4.500)

## 2015-09-02 LAB — URINALYSIS COMPLETE WITH MICROSCOPIC (ARMC ONLY)
BILIRUBIN URINE: NEGATIVE
Nitrite: POSITIVE — AB
Protein, ur: 100 mg/dL — AB
Specific Gravity, Urine: 1.015 (ref 1.005–1.030)
pH: 7 (ref 5.0–8.0)

## 2015-09-02 MED ORDER — LABETALOL HCL 5 MG/ML IV SOLN
20.0000 mg | INTRAVENOUS | Status: DC | PRN
Start: 2015-09-02 — End: 2015-09-02

## 2015-09-02 MED ORDER — HYDRALAZINE HCL 20 MG/ML IJ SOLN: 10.0000 mg | Freq: Once | INTRAMUSCULAR | Status: DC | PRN

## 2015-09-02 NOTE — Discharge Summary (Signed)
Patient discharged home, discharge instructions given, patient states understanding. Patient left floor in stable condition, denies any other needs at this time. Patient to schedule BP check and return urine on thursday, reviewed preeclampsia signs and symptoms.

## 2015-09-02 NOTE — OB Triage Note (Signed)
Patient sent from office for elevated BP, 2+ protein, 5 lb weight gain in 4 days.

## 2015-09-02 NOTE — Discharge Instructions (Signed)
Drink plenty of fluid and get plenty of rest. Call your provider for any other concerns. Start 24 hour urine collection (collect all urine and place in container for 24 hours) tomorrow 09/03/2015 after first void and schedule blood pressure check and turn in urine on Thursday morning at [place]. Keep urine refrigerated.

## 2015-09-03 LAB — HEMOGLOBIN A1C: Hgb A1c MFr Bld: 5.3 % (ref 4.0–6.0)

## 2015-09-04 LAB — URINE CULTURE

## 2015-09-18 ENCOUNTER — Observation Stay
Admission: EM | Admit: 2015-09-18 | Discharge: 2015-09-18 | Disposition: A | Discharge status: Home / Self Care | Payer: Medicaid Other | Attending: Obstetrics and Gynecology | Admitting: Obstetrics and Gynecology

## 2015-09-18 DIAGNOSIS — Z880 Allergy status to penicillin: Secondary | ICD-10-CM | POA: Diagnosis not present

## 2015-09-18 DIAGNOSIS — Z87440 Personal history of urinary (tract) infections: Secondary | ICD-10-CM | POA: Diagnosis not present

## 2015-09-18 DIAGNOSIS — Z87442 Personal history of urinary calculi: Secondary | ICD-10-CM | POA: Insufficient documentation

## 2015-09-18 DIAGNOSIS — Z87891 Personal history of nicotine dependence: Secondary | ICD-10-CM | POA: Diagnosis not present

## 2015-09-18 DIAGNOSIS — O26899 Other specified pregnancy related conditions, unspecified trimester: Secondary | ICD-10-CM

## 2015-09-18 DIAGNOSIS — Z3A23 23 weeks gestation of pregnancy: Secondary | ICD-10-CM | POA: Diagnosis not present

## 2015-09-18 DIAGNOSIS — O2342 Unspecified infection of urinary tract in pregnancy, second trimester: Secondary | ICD-10-CM | POA: Diagnosis not present

## 2015-09-18 DIAGNOSIS — R102 Pelvic and perineal pain: Secondary | ICD-10-CM | POA: Diagnosis present

## 2015-09-18 DIAGNOSIS — R109 Unspecified abdominal pain: Secondary | ICD-10-CM

## 2015-09-18 LAB — URINALYSIS COMPLETE WITH MICROSCOPIC (ARMC ONLY)
BILIRUBIN URINE: NEGATIVE
GLUCOSE, UA: NEGATIVE mg/dL
KETONES UR: NEGATIVE mg/dL
NITRITE: POSITIVE — AB
PH: 6 (ref 5.0–8.0)
Protein, ur: 100 mg/dL — AB
Specific Gravity, Urine: 1.023 (ref 1.005–1.030)

## 2015-09-18 MED ORDER — ACETAMINOPHEN 325 MG PO TABS: 650.0000 mg | ORAL_TABLET | ORAL | Status: DC | PRN

## 2015-09-18 MED ORDER — SULFAMETHOXAZOLE-TRIMETHOPRIM 800-160 MG PO TABS: 1.0000 | ORAL_TABLET | Freq: Two times a day (BID) | ORAL | Status: DC

## 2015-09-18 NOTE — OB Triage Note (Signed)
Presents with c/o back pain, pressure, vomiting since yesterday.  Has phenergan and oxycodone prescription at home.  Just got off working a 12 hour shift.  Has not taken any Macrobid or Keflex in 2 days.  Urine sample obtained and sent.  Fetal heart by doppler 150s, toco applied to soft non-tender abdomen.

## 2015-09-18 NOTE — H&P (Signed)
Obstetric H&P   Chief Complaint: Pelvic pressure  Prenatal Care Provider: [facility]  History of Present Illness: 34 y.o. G2P1000 [redacted]w[redacted]d by 01/13/2016, 34 y.o. presenting with pelvic pressure.  Has had darker looking urine past 2-3 days.  No flank pain, back pain, or fevers.  Patient has had two previous admission for suspected pyelonephritis.  Has had nausea off and on this pregnancy unchanged.  She has been taking her Macrobid inconsistently.  FM, no LOF, no VB, no ctx  [facility] note able to late entry to care, possible CHTN, and history of prior IUFD at 32 weeks  Review of Systems: 10 point review of systems negative unless otherwise noted in HPI  Past Medical History: Past Medical History  Diagnosis Date  . Kidney calculi   . UTI (lower urinary tract infection)     Past Surgical History: Past Surgical History  Procedure Laterality Date  . Appendectomy    . Lithotripsy      Family History: No family history on file.  Social History: Social History   Social History  . Marital Status: Divorced    Spouse Name: N/A  . Number of Children: N/A  . Years of Education: N/A   Occupational History  . Not on file.   Social History Main Topics  . Smoking status: Former Research scientist (life sciences)  . Smokeless tobacco: Never Used  . Alcohol Use: No  . Drug Use: No  . Sexual Activity: Yes    Birth Control/ Protection: Condom   Other Topics Concern  . Not on file   Social History Narrative    Medications: Prior to Admission medications   Medication Sig Start Date End Date Taking? Authorizing Provider  nitrofurantoin, macrocrystal-monohydrate, (MACROBID) 100 MG capsule Take 1 capsule (100 mg total) by mouth at bedtime. Start once finished with Bactrim 08/08/15   Will Bonnet, MD  oxyCODONE-acetaminophen (PERCOCET/ROXICET) 5-325 MG per tablet Take 1-2 tablets by mouth every 6 (six) hours as needed for moderate pain. 07/16/15   Dalia Heading, CNM  Prenatal Vit-Fe Fumarate-FA (PRENATAL MULTIVITAMIN) TABS  tablet Take 1 tablet by mouth daily at 12 noon.    Historical Provider, MD  promethazine (PHENERGAN) 12.5 MG tablet Take 1 tablet (12.5 mg total) by mouth every 4 (four) hours as needed for nausea or vomiting. 07/16/15   Dalia Heading, CNM  scopolamine (TRANSDERM-SCOP, 1.5 MG,) 1 MG/3DAYS Place 1 patch (1.5 mg total) onto the skin every 3 (three) days. 07/16/15   Dalia Heading, CNM  sulfamethoxazole-trimethoprim (BACTRIM DS,SEPTRA DS) 800-160 MG tablet Take 1 tablet by mouth 2 (two) times daily. 09/18/15   Malachy Mood, MD    Allergies: Allergies  Allergen Reactions  . Penicillins Rash    Physical Exam: Vitals: Blood pressure 142/78, pulse 85, temperature 98.1 F (36.7 C), temperature source Oral, resp. rate 18, last menstrual period 04/06/2015.  Urine Dip Protein: see UA  FHT: +FHT Toco:negative tocometry  General:NAD HEENT: normocephalic, anicteric Pulmonary: no increased work of breathing Abdomen: Gravid, non-tender Leopolds: vtx Genitourinary: deferred Extremities: no edema  Labs: Results for orders placed or performed during the hospital encounter of 09/18/15 (from the past 24 hour(s))  Urinalysis complete, with microscopic ([facility] only)     Status: Abnormal   Collection Time: 09/18/15  1:17 AM  Result Value Ref Range   Color, Urine AMBER (A) YELLOW   APPearance TURBID (A) CLEAR   Glucose, UA NEGATIVE NEGATIVE mg/dL   Bilirubin Urine NEGATIVE NEGATIVE   Ketones, ur NEGATIVE NEGATIVE mg/dL   Specific Gravity, Urine 1.023 1.005 -  1.030   Hgb urine dipstick 3+ (A) NEGATIVE   pH 6.0 5.0 - 8.0   Protein, ur 100 (A) NEGATIVE mg/dL   Nitrite POSITIVE (A) NEGATIVE   Leukocytes, UA 2+ (A) NEGATIVE   RBC / HPF TOO NUMEROUS TO COUNT 0 - 5 RBC/hpf   WBC, UA TOO NUMEROUS TO COUNT 0 - 5 WBC/hpf   Bacteria, UA MANY (A) NONE SEEN   Squamous Epithelial / LPF TOO NUMEROUS TO COUNT (A) NONE SEEN   Mucous PRESENT    Ca Oxalate Crys, UA PRESENT     Assessment: 34 y.o.  G2P1000 [redacted]w[redacted]d by 01/13/2016,  With UTI  Plan: 1) UTI - rx bactrim as on Macrobid already.  Will send for culture   2) Fetus - previable positive FHT's  3) Disposition - home, follow up 10/18 in clinic

## 2015-09-21 LAB — URINE CULTURE: Special Requests: NORMAL

## 2015-10-11 ENCOUNTER — Encounter: Payer: Self-pay | Admitting: Obstetrics and Gynecology

## 2015-10-11 ENCOUNTER — Observation Stay: Payer: Medicaid Other

## 2015-10-11 ENCOUNTER — Inpatient Hospital Stay
Admission: EM | Admit: 2015-10-11 | Discharge: 2015-10-13 | DRG: 781 | Disposition: A | Discharge status: Home / Self Care | Payer: Medicaid Other | Attending: Obstetrics and Gynecology | Admitting: Obstetrics and Gynecology

## 2015-10-11 DIAGNOSIS — Z3A26 26 weeks gestation of pregnancy: Secondary | ICD-10-CM | POA: Diagnosis not present

## 2015-10-11 DIAGNOSIS — N200 Calculus of kidney: Secondary | ICD-10-CM | POA: Diagnosis present

## 2015-10-11 DIAGNOSIS — N132 Hydronephrosis with renal and ureteral calculous obstruction: Secondary | ICD-10-CM | POA: Diagnosis present

## 2015-10-11 DIAGNOSIS — O10912 Unspecified pre-existing hypertension complicating pregnancy, second trimester: Secondary | ICD-10-CM | POA: Diagnosis present

## 2015-10-11 DIAGNOSIS — O9989 Other specified diseases and conditions complicating pregnancy, childbirth and the puerperium: Principal | ICD-10-CM | POA: Diagnosis present

## 2015-10-11 DIAGNOSIS — Z87442 Personal history of urinary calculi: Secondary | ICD-10-CM

## 2015-10-11 DIAGNOSIS — Z880 Allergy status to penicillin: Secondary | ICD-10-CM | POA: Diagnosis not present

## 2015-10-11 DIAGNOSIS — R310 Gross hematuria: Secondary | ICD-10-CM

## 2015-10-11 DIAGNOSIS — Z87891 Personal history of nicotine dependence: Secondary | ICD-10-CM

## 2015-10-11 DIAGNOSIS — I10 Essential (primary) hypertension: Secondary | ICD-10-CM | POA: Diagnosis present

## 2015-10-11 DIAGNOSIS — O0990 Supervision of high risk pregnancy, unspecified, unspecified trimester: Secondary | ICD-10-CM

## 2015-10-11 LAB — URINALYSIS COMPLETE WITH MICROSCOPIC (ARMC ONLY)
BILIRUBIN URINE: NEGATIVE
Glucose, UA: NEGATIVE mg/dL
Ketones, ur: NEGATIVE mg/dL
Nitrite: POSITIVE — AB
PH: 6 (ref 5.0–8.0)
Protein, ur: 100 mg/dL — AB
Specific Gravity, Urine: 1.014 (ref 1.005–1.030)

## 2015-10-11 LAB — CBC
HCT: 35.3 % (ref 35.0–47.0)
HEMOGLOBIN: 11.7 g/dL — AB (ref 12.0–16.0)
MCH: 27 pg (ref 26.0–34.0)
MCHC: 33.3 g/dL (ref 32.0–36.0)
MCV: 81.1 fL (ref 80.0–100.0)
PLATELETS: 191 10*3/uL (ref 150–440)
RBC: 4.36 MIL/uL (ref 3.80–5.20)
RDW: 14.3 % (ref 11.5–14.5)
WBC: 10.5 10*3/uL (ref 3.6–11.0)

## 2015-10-11 LAB — PROTEIN / CREATININE RATIO, URINE
Creatinine, Urine: 98 mg/dL
PROTEIN CREATININE RATIO: 1.45 mg/mg{creat} — AB (ref 0.00–0.15)
TOTAL PROTEIN, URINE: 142 mg/dL

## 2015-10-11 LAB — COMPREHENSIVE METABOLIC PANEL
ALBUMIN: 2.9 g/dL — AB (ref 3.5–5.0)
ALK PHOS: 53 U/L (ref 38–126)
ALT: 15 U/L (ref 14–54)
AST: 13 U/L — AB (ref 15–41)
Anion gap: 8 (ref 5–15)
BUN: 6 mg/dL (ref 6–20)
CALCIUM: 8.3 mg/dL — AB (ref 8.9–10.3)
CHLORIDE: 102 mmol/L (ref 101–111)
CO2: 23 mmol/L (ref 22–32)
CREATININE: 0.47 mg/dL (ref 0.44–1.00)
GFR calc non Af Amer: >60 mL/min (ref >60)
GLUCOSE: 93 mg/dL (ref 65–99)
Potassium: 3.5 mmol/L (ref 3.5–5.1)
SODIUM: 133 mmol/L — AB (ref 135–145)
Total Bilirubin: <0.1 mg/dL — ABNORMAL LOW (ref 0.3–1.2)
Total Protein: 6.3 g/dL — ABNORMAL LOW (ref 6.5–8.1)

## 2015-10-11 MED ORDER — SODIUM CHLORIDE 0.9 % IJ SOLN
INTRAMUSCULAR | Status: AC
  Filled 2015-10-11: qty 10

## 2015-10-11 MED ORDER — ONDANSETRON 4 MG PO TBDP
4.0000 mg | ORAL_TABLET | Freq: Four times a day (QID) | ORAL | Status: DC | PRN
Start: 2015-10-11 — End: 2015-10-12
  Filled 2015-10-11: qty 1

## 2015-10-11 MED ORDER — ONDANSETRON HCL 4 MG/2ML IJ SOLN: 4.0000 mg | INTRAMUSCULAR | Status: DC | PRN

## 2015-10-11 MED ORDER — ONDANSETRON HCL 4 MG/2ML IJ SOLN
INTRAMUSCULAR | Status: AC
  Administered 2015-10-11: 4 mg
  Filled 2015-10-11: qty 2

## 2015-10-11 MED ORDER — MORPHINE SULFATE (PF) 2 MG/ML IV SOLN
2.0000 mg | INTRAVENOUS | Status: DC | PRN
  Administered 2015-10-11 – 2015-10-12 (×10): 2 mg via INTRAVENOUS
  Filled 2015-10-11 (×9): qty 1

## 2015-10-11 MED ORDER — LACTATED RINGERS IV SOLN
INTRAVENOUS | Status: DC
  Administered 2015-10-12 (×2): via INTRAVENOUS

## 2015-10-11 MED ORDER — PROMETHAZINE HCL 25 MG/ML IJ SOLN
25.0000 mg | Freq: Four times a day (QID) | INTRAMUSCULAR | Status: DC | PRN
  Administered 2015-10-11: 25 mg via INTRAVENOUS

## 2015-10-11 MED ORDER — PROMETHAZINE HCL 25 MG/ML IJ SOLN
INTRAMUSCULAR | Status: AC
  Administered 2015-10-11: 25 mg via INTRAVENOUS
  Filled 2015-10-11: qty 1

## 2015-10-11 MED ORDER — ONDANSETRON 4 MG PO TBDP
ORAL_TABLET | ORAL | Status: AC
  Administered 2015-10-11: 4 mg
  Filled 2015-10-11: qty 1

## 2015-10-11 MED ORDER — MORPHINE SULFATE (PF) 2 MG/ML IV SOLN
INTRAVENOUS | Status: AC
  Administered 2015-10-11: 2 mg via INTRAVENOUS
  Filled 2015-10-11: qty 1

## 2015-10-11 NOTE — Progress Notes (Signed)
Patient renal u/s shows left renal stone with mild hydronephrosis, non-obstructive.  Patient still with nausea and pain, but relieved with phenergan and small-dose morphine.   Will monitor overnight for fever in case has pyelo again. If afebrile, will advance and send home tomorrow.

## 2015-10-11 NOTE — H&P (Signed)
  Obstetric H&P   Chief Complaint: Back pain, nausea  Prenatal Care Provider: [facility]  History of Present Illness: 34 y.o. G2P1000 [redacted]w[redacted]d with EDD of 01/13/2016, presenting with back pain that started yesterday morning.  Has had darker looking urine in that time.  She has had left back pain that does not radiate and is like a strong pressure.  She had a fever overnight to 101.54F for which she took tylenol. She also has been having nausea with emesis.  Patient has had two previous admission for suspected pyelonephritis.  Has had nausea off and on this pregnancy unchanged.  She was hospitalized in early August and early September.  She was discharged in September on bactrim for a full 14-day course and was to start on macrobid for suppression. She was las seen in clinic on 09/10/15 and was switched to keflex per documentation.  Per the patient she was told to take bactrim and then stop all suppressive medication after (she was seen on L&D on 09/18/15).  So, she is taking no antibiotics for suppression at this time.  Though she has been admitted twice for suspected pyelonephritis, her urine cultures have never been positive.  She notes +FM, no LOF, no VB, no ctx.   [place] note able to late entry to care, CHTN, and history of prior IUFD at 2 weeks  Review of Systems: 10 point review of systems negative unless otherwise noted in HPI  Past Medical History  Diagnosis Date  . Kidney calculi   . UTI (lower urinary tract infection)     Past Surgical History  Procedure Laterality Date  . Appendectomy    . Lithotripsy      Family History: History reviewed. No pertinent family history.  Social History   Social History  . Marital Status: Divorced    Spouse Name: N/A  . Number of Children: N/A  . Years of Education: N/A   Occupational History  . Not on file.   Social History Main Topics  . Smoking status: Former Research scientist (life sciences)  . Smokeless tobacco: Never Used  . Alcohol Use: No  . Drug Use: No  . Sexual  Activity: Yes    Birth Control/ Protection: Condom   Other Topics Concern  . Not on file   Social History Narrative    Medications: PNV  Allergies: Allergies  Allergen Reactions  . Penicillins Rash    Physical Exam: BP 155/96 mmHg  Pulse 94  Temp(Src) 98.5 F (36.9 C) (Oral)  Resp 20  Ht 5\' 3"  (1.6 m)  Wt 182 lb (82.555 kg)  BMI 32.25 kg/m2  SpO2 99%  LMP 04/06/2015  Gen: mild distress CV: RRR Pulm: CTAB Back: left CVAT that was sensitive along paraspinous muscle to iliac crest Abd: gravid, non-tender Ext: no e/c/t  Urine: dark red  FHT: 140/ mod var Toco:negative tocometry  Labs:Pending  Assessment: 34 y.o. G2P1000 at [redacted]w[redacted]d with EDD 01/13/2016,  With left back pain and nausea/vomiting  Plan: 1) back pain: send u/a and urine culture.  Likely will get ultrasound of kidneys and ureters to assess for nephroureterolithiasis and hydroureter/hydronephrosis.   2) Elevated BPs: She likely has chronic hypertension.  Patient has had elevated BPs before in this pregnancy, close to 20 weeks.  Will get preeclampsia panel.  3) Fetus - reassuring given gestational age  86) Disposition - pending workup.

## 2015-10-11 NOTE — ED Notes (Addendum)
Patient c/o left flank pain, c/o low-grade fever, vomiting, dysuria since yesterday. Patient c/o increased swelling in feet where she can't wear regular shoes. Patient is [redacted] weeks pregnant.

## 2015-10-12 ENCOUNTER — Encounter: Payer: Self-pay | Admitting: *Deleted

## 2015-10-12 DIAGNOSIS — N200 Calculus of kidney: Secondary | ICD-10-CM

## 2015-10-12 LAB — URINE CULTURE

## 2015-10-12 MED ORDER — PROMETHAZINE HCL 25 MG/ML IJ SOLN: 25.0000 mg | Freq: Four times a day (QID) | INTRAMUSCULAR | Status: DC | PRN

## 2015-10-12 MED ORDER — INFLUENZA VAC SPLIT QUAD 0.5 ML IM SUSY
0.5000 mL | PREFILLED_SYRINGE | INTRAMUSCULAR | Status: AC
  Administered 2015-10-13: 0.5 mL via INTRAMUSCULAR
  Filled 2015-10-12: qty 0.5

## 2015-10-12 MED ORDER — HYDROCODONE-ACETAMINOPHEN 5-325 MG PO TABS: 1.0000 | ORAL_TABLET | ORAL | Status: DC | PRN

## 2015-10-12 MED ORDER — PROMETHAZINE HCL 25 MG/ML IJ SOLN
25.0000 mg | Freq: Four times a day (QID) | INTRAMUSCULAR | Status: DC | PRN
  Administered 2015-10-12: 25 mg via INTRAMUSCULAR
  Filled 2015-10-12: qty 1

## 2015-10-12 MED ORDER — PROMETHAZINE HCL 25 MG PO TABS
25.0000 mg | ORAL_TABLET | Freq: Four times a day (QID) | ORAL | Status: DC | PRN
  Administered 2015-10-12 – 2015-10-13 (×3): 25 mg via ORAL
  Filled 2015-10-12 (×3): qty 1

## 2015-10-12 MED ORDER — HYDROCODONE-ACETAMINOPHEN 5-325 MG PO TABS
2.0000 | ORAL_TABLET | ORAL | Status: DC | PRN
  Administered 2015-10-12 – 2015-10-13 (×5): 2 via ORAL
  Filled 2015-10-12 (×6): qty 2

## 2015-10-12 NOTE — Procedures (Signed)
FETAL SURVEILLANCE TESTING SUMMARY  INDICATIONS: Nephrolithiasis, history of pyelonephritis, [redacted] weeks gestation  OBJECTIVE RESULTS: Fetal heart variability: moderate Fetal Heart Rate decelerations: none Fetal Heart Rate accelerations: yes, 10 x 10 Baseline FHR: 140 per minute Fetal Non-stress Test: reactive, for gestational age Uterine contractions: none  Fetal surveillance: reassuring  Prentice Docker, MD 10/12/2015 7:53 PM

## 2015-10-12 NOTE — Progress Notes (Signed)
Patient ID: Brenda Todd, female   DOB: 31-May-1981, 34 y.o.   MRN: [id number] Daily Benign Gynecology Progress Note Brenda Todd  [id number]  HD#2 Subjective:  Overnight Events: none Complaints: continues to have nausea and pain requiring IV phenergan and IV morphine She denies: fevers, chills, chest pain, trouble breathing, nausea, vomiting, severe abdominal pain.  She has tolerated: some food.  Other times she vomits them back up. She reports her pain is moderately well controlled with IV morhpine.   She is ambulating and is voiding.  Objective:  Most recent vitals Temp: 98.1 F (36.7 C)  BP: 119/72 mmHg  Pulse Rate: 82  Resp: 18  SpO2: 100 %   Vitals Range over 24 hours BP  Min: 109/59  Max: 139/75 Temp  Avg: 98.3 F (36.8 C)  Min: 97.8 F (36.6 C)  Max: 98.7 F (37.1 C) Pulse  Avg: 83.8  Min: 77  Max: 95 SpO2  Avg: 99.7 %  Min: 99 %  Max: 100 %  Physical Exam General: alert, well appearing, and in no distress Heart: regular rate and rhythm, no murmurs Lungs: clear to auscultation, no wheezes, rales or rhonchi, symmetric air entry Abdomen: abdomen is soft without significant tenderness, masses, organomegaly or guarding. Continues to have moderate left CVAT Extremities: no redness or tenderness in the calves or thighs, no edema  AM Labs Lab Results  Component Value Date   WBC 10.5 10/11/2015   HGB 11.7* 10/11/2015   HCT 35.3 10/11/2015   PLT 191 10/11/2015   NA 133* 10/11/2015   K 3.5 10/11/2015   CREATININE 0.47 10/11/2015   BUN 6 10/11/2015     Assessment:  Brenda Todd is a 34 y.o. female HD#2 admitted with nephrolithiasis and intractable nausea/vomiting  Plan:  Pain Control:  Currently with morphine IV, will switch to po as soon as able. ID: no evidence of infection so far.  If spike fever, will treat as if she has pyelonephritis. GI: advance diet as tolerated.  Switch to PO phenergan.  GU:  Straining urine. Current issue could be treated as outpatient  once tolerating PO. Nutrition:  Clear liquids until better tolerating PO. IVF: D5NS at 125 mL/hour DVT Prophylaxis:  TED hose Activity: as tolerated Anticipated Discharge: tomorrow or later today, if clinically improves.  Will Bonnet, MD  10/12/2015 2:26 PM

## 2015-10-13 LAB — COMPREHENSIVE METABOLIC PANEL
ALBUMIN: 2.9 g/dL — AB (ref 3.5–5.0)
ALK PHOS: 51 U/L (ref 38–126)
ALT: 14 U/L (ref 14–54)
AST: 13 U/L — AB (ref 15–41)
Anion gap: 4 — ABNORMAL LOW (ref 5–15)
BILIRUBIN TOTAL: 0.3 mg/dL (ref 0.3–1.2)
BUN: 5 mg/dL — AB (ref 6–20)
CALCIUM: 8.2 mg/dL — AB (ref 8.9–10.3)
CO2: 24 mmol/L (ref 22–32)
Chloride: 107 mmol/L (ref 101–111)
Creatinine, Ser: 0.53 mg/dL (ref 0.44–1.00)
GFR calc Af Amer: >60 mL/min (ref >60)
GFR calc non Af Amer: >60 mL/min (ref >60)
GLUCOSE: 122 mg/dL — AB (ref 65–99)
POTASSIUM: 3.5 mmol/L (ref 3.5–5.1)
Sodium: 135 mmol/L (ref 135–145)
TOTAL PROTEIN: 6.4 g/dL — AB (ref 6.5–8.1)

## 2015-10-13 LAB — CBC
HEMATOCRIT: 36.7 % (ref 35.0–47.0)
HEMOGLOBIN: 12.3 g/dL (ref 12.0–16.0)
MCH: 27.4 pg (ref 26.0–34.0)
MCHC: 33.5 g/dL (ref 32.0–36.0)
MCV: 82 fL (ref 80.0–100.0)
Platelets: 184 10*3/uL (ref 150–440)
RBC: 4.47 MIL/uL (ref 3.80–5.20)
RDW: 14.2 % (ref 11.5–14.5)
WBC: 7.9 10*3/uL (ref 3.6–11.0)

## 2015-10-13 LAB — LIPASE, BLOOD: Lipase: 20 U/L (ref 11–51)

## 2015-10-13 MED ORDER — HYDROCODONE-ACETAMINOPHEN 5-325 MG PO TABS: 1.0000 | ORAL_TABLET | ORAL | Status: DC | PRN

## 2015-10-13 MED ORDER — METOCLOPRAMIDE HCL 10 MG PO TABS
10.0000 mg | ORAL_TABLET | Freq: Three times a day (TID) | ORAL | Status: DC
  Administered 2015-10-13 (×2): 10 mg via ORAL
  Filled 2015-10-13 (×2): qty 1

## 2015-10-13 MED ORDER — NITROFURANTOIN MONOHYD MACRO 100 MG PO CAPS: 100.0000 mg | ORAL_CAPSULE | Freq: Every day | ORAL | Status: DC

## 2015-10-13 MED ORDER — PROMETHAZINE HCL 12.5 MG PO TABS: 12.5000 mg | ORAL_TABLET | ORAL | Status: DC | PRN

## 2015-10-13 MED ORDER — METOCLOPRAMIDE HCL 10 MG PO TABS: 10.0000 mg | ORAL_TABLET | Freq: Three times a day (TID) | ORAL | Status: DC

## 2015-10-13 NOTE — Progress Notes (Signed)
Obstetric and Gynecology  Subjective  Patient reports nausea improved, looking forward to breakfast this morning.    Objective   Filed Vitals:   10/13/15 0816  BP: 138/74  Pulse: 83  Temp: 98.6 F (37 C)  Resp: 18     Intake/Output Summary (Last 24 hours) at 10/13/15 0920 Last data filed at 10/13/15 0425  Gross per 24 hour  Intake    962 ml  Output   3855 ml  Net  -2893 ml    General: NAD Cardiovascular: RRR Pulm: no increased work of breathing Abdomen: gravid, non-tender, non-distended Extremities: no edema  Labs: No results found for this or any previous visit (from the past 24 hour(s)).  Cultures: Results for orders placed or performed during the hospital encounter of 10/11/15  Urine culture     Status: None   Collection Time: 10/11/15  1:44 PM  Result Value Ref Range Status   Specimen Description URINE, CLEAN CATCH  Final   Special Requests NONE  Final   Culture MULTIPLE SPECIES PRESENT, SUGGEST RECOLLECTION  Final   Report Status 10/12/2015 FINAL  Final    Imaging:  Assessment   34 y.o. G2P1001 at 101w6d HD#3 with flank pain and nausea  Plan   1) Flank pain - small 84mm non-obstructing renal calculus is visualized in the left kidney.  Clinically improving.  Will repeat labs to ensure no change.  Still afebrile so does not appear to be pylonephritis.  Stressed importance of suppression dose antibiotics.  We also discussed possibility of straight cath UA.  2) Fetus - daily NST  3) Disposition - home later today if labs stable given patient feeling better

## 2015-10-13 NOTE — Progress Notes (Signed)
Discharge instructions reviewed with pt and pt verbalizes understanding.

## 2015-10-13 NOTE — Discharge Summary (Signed)
Physician Discharge Summary  Patient ID: Brenda Todd MRN: [id number] DOB/AGE: [birth year] 34 y.o.  Admit date: 10/11/2015 Discharge date: 10/13/2015  Admission Diagnoses: Flank pain in pregnancy  Discharge Diagnoses:  Principal Problem:   Nephrolithiasis Active Problems:   Supervision of high-risk pregnancy   [redacted] weeks gestation of pregnancy   Labor and delivery, indication for care   Hypertension   Discharged Condition: good  Hospital Course: Patient admitted with left flank pain, nausea and vomiting.  Patient history significant for 2 admission for presumptive pyelonephritis although cultures non-diagnostic.  Has not been consistent with suppression dose antibiotics.  Also history of nephrolithiasis.  Evaluation revealed small 65m non-obstructing left renal calculus.  Symptoms gradually improved sufficiently to allow discharge on HD#3  Consults: None  Significant Diagnostic Studies: Results for orders placed or performed during the hospital encounter of 10/11/15 (from the past 72 hour(s))  Protein / creatinine ratio, urine     Status: Abnormal   Collection Time: 10/11/15  1:44 PM  Result Value Ref Range   Creatinine, Urine 98 mg/dL   Total Protein, Urine 142 mg/dL    Comment: NO NORMAL RANGE ESTABLISHED FOR THIS TEST   Protein Creatinine Ratio 1.45 (H) 0.00 - 0.15 mg/mg[Cre]  Urinalysis complete, with microscopic ([facility] only)     Status: Abnormal   Collection Time: 10/11/15  1:44 PM  Result Value Ref Range   Color, Urine RED (A) YELLOW   APPearance CLOUDY (A) CLEAR   Glucose, UA NEGATIVE NEGATIVE mg/dL   Bilirubin Urine NEGATIVE NEGATIVE   Ketones, ur NEGATIVE NEGATIVE mg/dL   Specific Gravity, Urine 1.014 1.005 - 1.030   Hgb urine dipstick 3+ (A) NEGATIVE   pH 6.0 5.0 - 8.0   Protein, ur 100 (A) NEGATIVE mg/dL   Nitrite POSITIVE (A) NEGATIVE   Leukocytes, UA 1+ (A) NEGATIVE   RBC / HPF TOO NUMEROUS TO COUNT 0 - 5 RBC/hpf   WBC, UA 6-30 0 - 5 WBC/hpf   Bacteria, UA  MANY (A) NONE SEEN   Squamous Epithelial / LPF 0-5 (A) NONE SEEN   Mucous PRESENT    Budding Yeast PRESENT   Urine culture     Status: None   Collection Time: 10/11/15  1:44 PM  Result Value Ref Range   Specimen Description URINE, CLEAN CATCH    Special Requests NONE    Culture MULTIPLE SPECIES PRESENT, SUGGEST RECOLLECTION    Report Status 10/12/2015 FINAL   CBC     Status: Abnormal   Collection Time: 10/11/15  1:57 PM  Result Value Ref Range   WBC 10.5 3.6 - 11.0 K/uL   RBC 4.36 3.80 - 5.20 MIL/uL   Hemoglobin 11.7 (L) 12.0 - 16.0 g/dL   HCT 35.3 35.0 - 47.0 %   MCV 81.1 80.0 - 100.0 fL   MCH 27.0 26.0 - 34.0 pg   MCHC 33.3 32.0 - 36.0 g/dL   RDW 14.3 11.5 - 14.5 %   Platelets 191 150 - 440 K/uL  Comprehensive metabolic panel     Status: Abnormal   Collection Time: 10/11/15  1:57 PM  Result Value Ref Range   Sodium 133 (L) 135 - 145 mmol/L   Potassium 3.5 3.5 - 5.1 mmol/L   Chloride 102 101 - 111 mmol/L   CO2 23 22 - 32 mmol/L   Glucose, Bld 93 65 - 99 mg/dL   BUN 6 6 - 20 mg/dL   Creatinine, Ser 0.47 0.44 - 1.00 mg/dL   Calcium 8.3 (L)  8.9 - 10.3 mg/dL   Total Protein 6.3 (L) 6.5 - 8.1 g/dL   Albumin 2.9 (L) 3.5 - 5.0 g/dL   AST 13 (L) 15 - 41 U/L   ALT 15 14 - 54 U/L   Alkaline Phosphatase 53 38 - 126 U/L   Total Bilirubin <0.1 (L) 0.3 - 1.2 mg/dL   GFR calc non Af Amer >60 >60 mL/min   GFR calc Af Amer >60 >60 mL/min    Comment: (NOTE) The eGFR has been calculated using the CKD EPI equation. This calculation has not been validated in all clinical situations. eGFR's persistently <60 mL/min signify possible Chronic Kidney Disease.    Anion gap 8 5 - 15  CBC     Status: None   Collection Time: 10/13/15  9:29 AM  Result Value Ref Range   WBC 7.9 3.6 - 11.0 K/uL   RBC 4.47 3.80 - 5.20 MIL/uL   Hemoglobin 12.3 12.0 - 16.0 g/dL   HCT 36.7 35.0 - 47.0 %   MCV 82.0 80.0 - 100.0 fL   MCH 27.4 26.0 - 34.0 pg   MCHC 33.5 32.0 - 36.0 g/dL   RDW 14.2 11.5 - 14.5 %    Platelets 184 150 - 440 K/uL  Comprehensive metabolic panel     Status: Abnormal   Collection Time: 10/13/15  9:29 AM  Result Value Ref Range   Sodium 135 135 - 145 mmol/L   Potassium 3.5 3.5 - 5.1 mmol/L   Chloride 107 101 - 111 mmol/L   CO2 24 22 - 32 mmol/L   Glucose, Bld 122 (H) 65 - 99 mg/dL   BUN 5 (L) 6 - 20 mg/dL   Creatinine, Ser 0.53 0.44 - 1.00 mg/dL   Calcium 8.2 (L) 8.9 - 10.3 mg/dL   Total Protein 6.4 (L) 6.5 - 8.1 g/dL   Albumin 2.9 (L) 3.5 - 5.0 g/dL   AST 13 (L) 15 - 41 U/L   ALT 14 14 - 54 U/L   Alkaline Phosphatase 51 38 - 126 U/L   Total Bilirubin 0.3 0.3 - 1.2 mg/dL   GFR calc non Af Amer >60 >60 mL/min   GFR calc Af Amer >60 >60 mL/min    Comment: (NOTE) The eGFR has been calculated using the CKD EPI equation. This calculation has not been validated in all clinical situations. eGFR's persistently <60 mL/min signify possible Chronic Kidney Disease.    Anion gap 4 (L) 5 - 15  Lipase, blood     Status: None   Collection Time: 10/13/15  9:29 AM  Result Value Ref Range   Lipase 20 11 - 51 U/L    Treatments: IV hydration  Discharge Exam: Blood pressure 145/77, pulse 88, temperature 97.6 F (36.4 C), temperature source Oral, resp. rate 18, height 5' 3"  (1.6 m), weight 82.555 kg (182 lb), last menstrual period 04/06/2015, SpO2 99 %.  Disposition: 01-Home or Self Care  Discharge Instructions    Discharge activity:  No Restrictions    Complete by:  As directed      Discharge diet:  No restrictions    Complete by:  As directed      Fetal Kick Count:  Lie on our left side for one hour after a meal, and count the number of times your baby kicks.  If it is less than 5 times, get up, move around and drink some juice.  Repeat the test 30 minutes later.  If it is still less than 5 kicks  in an hour, notify your doctor.    Complete by:  As directed      No sexual activity restrictions    Complete by:  As directed      Notify physician for a general feeling that  "something is not right"    Complete by:  As directed      Notify physician for increase or change in vaginal discharge    Complete by:  As directed      Notify physician for intestinal cramps, with or without diarrhea, sometimes described as "gas pain"    Complete by:  As directed      Notify physician for leaking of fluid    Complete by:  As directed      Notify physician for low, dull backache, unrelieved by heat or Tylenol    Complete by:  As directed      Notify physician for menstrual like cramps    Complete by:  As directed      Notify physician for pelvic pressure    Complete by:  As directed      Notify physician for uterine contractions.  These may be painless and feel like the uterus is tightening or the baby is  "balling up"    Complete by:  As directed      Notify physician for vaginal bleeding    Complete by:  As directed      PRETERM LABOR:  Includes any of the follwing symptoms that occur between 20 - [redacted] weeks gestation.  If these symptoms are not stopped, preterm labor can result in preterm delivery, placing your baby at risk    Complete by:  As directed             Medication List    STOP taking these medications        sulfamethoxazole-trimethoprim 800-160 MG tablet  Commonly known as:  BACTRIM DS,SEPTRA DS      TAKE these medications        HYDROcodone-acetaminophen 5-325 MG tablet  Commonly known as:  NORCO/VICODIN  Take 1 tablet by mouth every 4 (four) hours as needed for moderate pain (pain score 4-7/10).     metoCLOPramide 10 MG tablet  Commonly known as:  REGLAN  Take 1 tablet (10 mg total) by mouth 3 (three) times daily before meals.     nitrofurantoin (macrocrystal-monohydrate) 100 MG capsule  Commonly known as:  MACROBID  Take 1 capsule (100 mg total) by mouth at bedtime. Start once finished with Bactrim     prenatal multivitamin Tabs tablet  Take 1 tablet by mouth daily at 12 noon.     promethazine 12.5 MG tablet  Commonly known as:   PHENERGAN  Take 1 tablet (12.5 mg total) by mouth every 4 (four) hours as needed for nausea or vomiting.     scopolamine 1 MG/3DAYS  Commonly known as:  TRANSDERM-SCOP (1.5 MG)  Place 1 patch (1.5 mg total) onto the skin every 3 (three) days.           Follow-up Information    Follow up with Will Bonnet, MD In 1 week.   Specialty:  Obstetrics and Gynecology   Why:  Follow up any MD in the next week   Contact information:   [address] [place] [address] [phone]       Signed: Dorthula Nettles 10/13/2015, 4:07 PM

## 2015-12-07 NOTE — L&D Delivery Note (Signed)
Delivery Note At 2:26 PM a viable female was delivered via C-Section, Low Transverse (Presentation: cephalic ).  APGAR: 7, 9; weight 6 lb 9.5 oz (2990 g).   Placenta status: Intact, expressed removal.  Cord: 3 vessels with the following complications: None.  Cord pH: n/a  Anesthesia: Spinal  Est. Blood Loss (mL):  500cc  Mom to postpartum.  Baby to Couplet care / Skin to Skin.  SEE OP NOTE  Ward, Chelsea C 12/18/2015, 12:41 AM

## 2015-12-08 ENCOUNTER — Encounter: Payer: Self-pay | Admitting: Certified Nurse Midwife

## 2015-12-08 ENCOUNTER — Inpatient Hospital Stay
Admission: EM | Admit: 2015-12-08 | Discharge: 2015-12-19 | DRG: 766 | Disposition: A | Discharge status: Home / Self Care | Payer: Medicaid Other | Attending: Obstetrics & Gynecology | Admitting: Obstetrics & Gynecology

## 2015-12-08 DIAGNOSIS — O114 Pre-existing hypertension with pre-eclampsia, complicating childbirth: Principal | ICD-10-CM | POA: Diagnosis present

## 2015-12-08 DIAGNOSIS — Z302 Encounter for sterilization: Secondary | ICD-10-CM | POA: Diagnosis not present

## 2015-12-08 DIAGNOSIS — G43909 Migraine, unspecified, not intractable, without status migrainosus: Secondary | ICD-10-CM | POA: Diagnosis not present

## 2015-12-08 DIAGNOSIS — Z3A36 36 weeks gestation of pregnancy: Secondary | ICD-10-CM

## 2015-12-08 DIAGNOSIS — Z87891 Personal history of nicotine dependence: Secondary | ICD-10-CM | POA: Diagnosis not present

## 2015-12-08 DIAGNOSIS — O163 Unspecified maternal hypertension, third trimester: Secondary | ICD-10-CM | POA: Diagnosis present

## 2015-12-08 DIAGNOSIS — O9081 Anemia of the puerperium: Secondary | ICD-10-CM | POA: Diagnosis not present

## 2015-12-08 DIAGNOSIS — Z79899 Other long term (current) drug therapy: Secondary | ICD-10-CM | POA: Diagnosis not present

## 2015-12-08 DIAGNOSIS — D649 Anemia, unspecified: Secondary | ICD-10-CM | POA: Diagnosis not present

## 2015-12-08 DIAGNOSIS — Z880 Allergy status to penicillin: Secondary | ICD-10-CM | POA: Diagnosis not present

## 2015-12-08 DIAGNOSIS — R05 Cough: Secondary | ICD-10-CM | POA: Diagnosis not present

## 2015-12-08 DIAGNOSIS — O358XX0 Maternal care for other (suspected) fetal abnormality and damage, not applicable or unspecified: Secondary | ICD-10-CM | POA: Diagnosis not present

## 2015-12-08 DIAGNOSIS — O1410 Severe pre-eclampsia, unspecified trimester: Secondary | ICD-10-CM | POA: Diagnosis present

## 2015-12-08 DIAGNOSIS — O1413 Severe pre-eclampsia, third trimester: Secondary | ICD-10-CM | POA: Diagnosis present

## 2015-12-08 HISTORY — DX: Essential (primary) hypertension: I10

## 2015-12-08 LAB — TYPE AND SCREEN
ABO/RH(D): O POS
ANTIBODY SCREEN: NEGATIVE

## 2015-12-08 LAB — COMPREHENSIVE METABOLIC PANEL
ALT: 11 U/L — ABNORMAL LOW (ref 14–54)
AST: 11 U/L — AB (ref 15–41)
Albumin: 3 g/dL — ABNORMAL LOW (ref 3.5–5.0)
Alkaline Phosphatase: 94 U/L (ref 38–126)
Anion gap: 6 (ref 5–15)
BILIRUBIN TOTAL: 0.4 mg/dL (ref 0.3–1.2)
BUN: 7 mg/dL (ref 6–20)
CHLORIDE: 107 mmol/L (ref 101–111)
CO2: 22 mmol/L (ref 22–32)
CREATININE: 0.58 mg/dL (ref 0.44–1.00)
Calcium: 8.6 mg/dL — ABNORMAL LOW (ref 8.9–10.3)
Glucose, Bld: 90 mg/dL (ref 65–99)
POTASSIUM: 3.8 mmol/L (ref 3.5–5.1)
Sodium: 135 mmol/L (ref 135–145)
TOTAL PROTEIN: 6.6 g/dL (ref 6.5–8.1)

## 2015-12-08 LAB — URINE DRUG SCREEN, QUALITATIVE (ARMC ONLY)
Amphetamines, Ur Screen: NOT DETECTED
BARBITURATES, UR SCREEN: NOT DETECTED
BENZODIAZEPINE, UR SCRN: NOT DETECTED
CANNABINOID 50 NG, UR ~~LOC~~: POSITIVE — AB
Cocaine Metabolite,Ur ~~LOC~~: NOT DETECTED
MDMA (Ecstasy)Ur Screen: NOT DETECTED
Methadone Scn, Ur: NOT DETECTED
OPIATE, UR SCREEN: NOT DETECTED
PHENCYCLIDINE (PCP) UR S: NOT DETECTED
Tricyclic, Ur Screen: NOT DETECTED

## 2015-12-08 LAB — PROTEIN / CREATININE RATIO, URINE
Creatinine, Urine: 152 mg/dL
PROTEIN CREATININE RATIO: 0.26 mg/mg{creat} — AB (ref 0.00–0.15)
TOTAL PROTEIN, URINE: 39 mg/dL

## 2015-12-08 LAB — CBC
HEMATOCRIT: 36 % (ref 35.0–47.0)
Hemoglobin: 12.2 g/dL (ref 12.0–16.0)
MCH: 26.7 pg (ref 26.0–34.0)
MCHC: 34 g/dL (ref 32.0–36.0)
MCV: 78.6 fL — ABNORMAL LOW (ref 80.0–100.0)
PLATELETS: 213 10*3/uL (ref 150–440)
RBC: 4.58 MIL/uL (ref 3.80–5.20)
RDW: 14.5 % (ref 11.5–14.5)
WBC: 10.4 10*3/uL (ref 3.6–11.0)

## 2015-12-08 MED ORDER — CALCIUM GLUCONATE 10 % IV SOLN
INTRAVENOUS | Status: AC
Start: 2015-12-08 — End: 2015-12-09
  Filled 2015-12-08: qty 20

## 2015-12-08 MED ORDER — LACTATED RINGERS IV SOLN
INTRAVENOUS | Status: DC
  Administered 2015-12-09: 11:00:00 via INTRAVENOUS
  Administered 2015-12-09: 112 mL/h via INTRAVENOUS
  Administered 2015-12-10: via INTRAVENOUS
  Administered 2015-12-10: 75 mL/h via INTRAVENOUS
  Administered 2015-12-11: 05:00:00 via INTRAVENOUS
  Administered 2015-12-11: 100 mL/h via INTRAVENOUS

## 2015-12-08 MED ORDER — LACTATED RINGERS IV SOLN
500.0000 mL | INTRAVENOUS | Status: DC | PRN
  Administered 2015-12-11: 500 mL via INTRAVENOUS

## 2015-12-08 MED ORDER — ACETAMINOPHEN 500 MG PO TABS
1000.0000 mg | ORAL_TABLET | Freq: Four times a day (QID) | ORAL | Status: DC | PRN
  Administered 2015-12-08 – 2015-12-12 (×8): 1000 mg via ORAL
  Filled 2015-12-08 (×9): qty 2

## 2015-12-08 MED ORDER — DINOPROSTONE 10 MG VA INST
10.0000 mg | VAGINAL_INSERT | Freq: Once | VAGINAL | Status: AC
  Administered 2015-12-08: 10 mg via VAGINAL
  Filled 2015-12-08: qty 1

## 2015-12-08 MED ORDER — BETAMETHASONE SOD PHOS & ACET 6 (3-3) MG/ML IJ SUSP
12.0000 mg | Freq: Once | INTRAMUSCULAR | Status: AC
  Administered 2015-12-08: 12 mg via INTRAMUSCULAR
  Filled 2015-12-08: qty 1

## 2015-12-08 MED ORDER — LIDOCAINE HCL (PF) 1 % IJ SOLN
30.0000 mL | INTRAMUSCULAR | Status: DC | PRN
  Filled 2015-12-08: qty 30

## 2015-12-08 MED ORDER — ONDANSETRON HCL 4 MG/2ML IJ SOLN: 4.0000 mg | Freq: Four times a day (QID) | INTRAMUSCULAR | Status: DC | PRN

## 2015-12-08 MED ORDER — OXYTOCIN 40 UNITS IN LACTATED RINGERS INFUSION - SIMPLE MED
62.5000 mL/h | INTRAVENOUS | Status: DC
  Filled 2015-12-08: qty 1000

## 2015-12-08 MED ORDER — CLINDAMYCIN PHOSPHATE 900 MG/50ML IV SOLN
900.0000 mg | Freq: Three times a day (TID) | INTRAVENOUS | Status: DC
  Administered 2015-12-08 – 2015-12-10 (×7): 900 mg via INTRAVENOUS
  Filled 2015-12-08 (×6): qty 50

## 2015-12-08 MED ORDER — MAGNESIUM SULFATE BOLUS VIA INFUSION
4.0000 g | Freq: Once | INTRAVENOUS | Status: AC
  Administered 2015-12-08: 4 g via INTRAVENOUS
  Filled 2015-12-08: qty 500

## 2015-12-08 MED ORDER — LABETALOL HCL 5 MG/ML IV SOLN: 20.0000 mg | INTRAVENOUS | Status: DC | PRN

## 2015-12-08 MED ORDER — OXYTOCIN BOLUS FROM INFUSION: 500.0000 mL | INTRAVENOUS | Status: DC

## 2015-12-08 MED ORDER — LABETALOL HCL 200 MG PO TABS
200.0000 mg | ORAL_TABLET | Freq: Two times a day (BID) | ORAL | Status: DC
  Administered 2015-12-08 – 2015-12-12 (×9): 200 mg via ORAL
  Filled 2015-12-08 (×9): qty 1

## 2015-12-08 MED ORDER — LACTATED RINGERS IV SOLN
500.0000 mL | INTRAVENOUS | Status: DC | PRN
  Administered 2015-12-08: 1000 mL via INTRAVENOUS

## 2015-12-08 MED ORDER — MAGNESIUM SULFATE 50 % IJ SOLN
2.0000 g/h | INTRAVENOUS | Status: DC
  Administered 2015-12-08: 1 g/h via INTRAVENOUS
  Administered 2015-12-10 – 2015-12-11 (×2): 2 g/h via INTRAVENOUS
  Filled 2015-12-08 (×3): qty 80

## 2015-12-08 NOTE — H&P (Signed)
OB History & Physical   History of Present Illness:  Chief Complaint:  Headache and seeing spots since 3 AM. Had elevated blood pressure in the office (138/94) and +2 proteinuria. She reports nausea intermittently, but no RUQ pain.  HPI:  Brenda Todd is a 35 y.o. G12P0100 female with EDC=01/13/2016 at 58w6ddated by a 6 wk ultrasound.  Her pregnancy has been complicated by multiple admissions for  flank pain and presumed pyelonephritis and nephrolithiasis, late entry to care, possible CHTN, and a history of a prior IUFD at 372weeks in 2004. .Marland KitchenShe has also not been seen in the clinic between 22 and 31 weeks. She has used MJ this pregnancy, last time was this AM for nausea. She presented to L&D for evaluation for possible preeclampsia.   [facility], the last one today, estimating fetal weight at 5#13oz with a normal AFI. On the 12/12 ultrasound the cavum septum pellucidi appeared enlarged. Received TDAP 12/5. Total weight gain 27#.      Maternal Medical History:   Past Medical History  Diagnosis Date  . Kidney calculi   . UTI (lower urinary tract infection)   . Hypertension   IUFD at 32 weeks   Past Surgical History  Procedure Laterality Date  . Appendectomy    . Lithotripsy      Allergies  Allergen Reactions  . Penicillins Anaphylaxis    Prior to Admission medications   Medication Sig Start Date End Date Taking? Authorizing Provider  butalbital-acetaminophen-caffeine (FIORICET, ESGIC) 50-325-40 MG tablet Take 1 tablet by mouth every 4 (four) hours as needed for headache.   Yes Historical Provider, MD  diphenhydrAMINE (BENADRYL) 25 mg capsule Take 50 mg by mouth at bedtime as needed.   Yes Historical Provider, MD  HYDROcodone-acetaminophen (NORCO/VICODIN) 5-325 MG tablet Take 1 tablet by mouth every 4 (four) hours as needed for moderate pain (pain score 4-7/10). 10/13/15  Yes AMalachy Mood MD   nitrofurantoin, macrocrystal-monohydrate, (MACROBID) 100 MG capsule Take 1 capsule (100 mg total) by mouth at bedtime. Start once finished with Bactrim 10/13/15  Yes AMalachy Mood MD  Prenatal Vit-Fe Fumarate-FA (PRENATAL MULTIVITAMIN) TABS tablet Take 1 tablet by mouth daily at 12 noon.   Yes Historical Provider, MD  promethazine (PHENERGAN) 12.5 MG tablet Take 1 tablet (12.5 mg total) by mouth every 4 (four) hours as needed for nausea or vomiting. 10/13/15  Yes AMalachy Mood MD  ranitidine (ZANTAC) 150 MG tablet Take 150 mg by mouth 2 (two) times daily.   Yes Historical Provider, MD          Social History: She reports that she quit smoking cigarettes. Last used marijuana this AM. Family History: Positive for breast cancer in MGM, hypertension in father, and lung cancer in father.  Review of Systems: Positive for frontal/orbital headache with scotomata, intermittent nausea,  Vaginal discharge, numbness in fingers on both hands, and nasal stuffiness. Denies contractions, vaginal bleeding, LOF, RUQ pain, CP, SOB.    Physical Exam:  Vital Signs:  Patient Vitals for the past 24 hrs:  BP Temp Temp src Pulse Resp Height Weight  12/08/15 2131 (!) 140/93 mmHg - - 100 18 - -  12/08/15 2103 (!) 146/92 mmHg 98.3 F (36.8 C) Oral (!) 104 20 - -  12/08/15 2032 (!) 143/94 mmHg - - 90 18 - -  12/08/15 1919 (!) 143/98 mmHg - - 91 18 - -  12/08/15 1903 (!) 152/99  mmHg - - 91 - - -  12/08/15 1849 128/67 mmHg - - 95 - - -  12/08/15 1834 137/87 mmHg - - 91 - - -  12/08/15 1818 (!) 156/99 mmHg - - 94 - - -  12/08/15 1804 (!) 147/83 mmHg - - 92 - - -  12/08/15 1748 (!) 160/99 mmHg - - (!) 104 - - -  12/08/15 1737 (!) 155/103 mmHg - - 98 - - -  12/08/15 1733 (!) 161/102 mmHg - - (!) 103 - - -  12/08/15 1726 - 98.2 F (36.8 C) Oral - - - -  12/08/15 1718 (!) 167/104 mmHg - - (!) 112 - - -  12/08/15 1712 - - - - 18 - -  12/08/15 1710 - - - - - 5' 3"  (1.6 m) 88.451 kg (195 lb)  12/08/15 1704  (!) 155/100 mmHg - - (!) 104 - - -  General: no acute distress.  HEENT: normocephalic, atraumatic Heart: regular rate & rhythm.  No murmurs Lungs: clear to auscultation bilaterally Abdomen: soft, gravid, non-tender;  EFW: 6# Pelvic:   External: Normal external female genitalia  Cervix: Dilation: Closed / Effacement (%): Thick / Station: Ballotable (-3)   Neurologic: Alert & oriented x 3, DTRs +2 to +3, trace-+1 edema lower extremities    Pertinent Results:  Prenatal Labs: Blood type/Rh O positive  Antibody screen negative  Rubella MMR x2  RPR Non reactive  HBsAg negative  HIV negative  GC negative  Chlamydia negative  Genetic screening NA  1 hour GTT 136  3 hour GTT NA  GBS Done today   Results for orders placed or performed during the hospital encounter of 12/08/15 (from the past 24 hour(s))  Urine Drug Screen, Qualitative ([place] only)     Status: Abnormal   Collection Time: 12/08/15  5:22 PM  Result Value Ref Range   Tricyclic, Ur Screen NONE DETECTED NONE DETECTED   Amphetamines, Ur Screen NONE DETECTED NONE DETECTED   MDMA (Ecstasy)Ur Screen NONE DETECTED NONE DETECTED   Cocaine Metabolite,Ur [place] NONE DETECTED NONE DETECTED   Opiate, Ur Screen NONE DETECTED NONE DETECTED   Phencyclidine (PCP) Ur S NONE DETECTED NONE DETECTED   Cannabinoid 50 Ng, Ur  POSITIVE (A) NONE DETECTED   Barbiturates, Ur Screen NONE DETECTED NONE DETECTED   Benzodiazepine, Ur Scrn NONE DETECTED NONE DETECTED   Methadone Scn, Ur NONE DETECTED NONE DETECTED  Protein / creatinine ratio, urine     Status: Abnormal   Collection Time: 12/08/15  5:22 PM  Result Value Ref Range   Creatinine, Urine 152 mg/dL   Total Protein, Urine 39 mg/dL   Protein Creatinine Ratio 0.26 (H) 0.00 - 0.15 mg/mg[Cre]  CBC     Status: Abnormal   Collection Time: 12/08/15  5:50 PM  Result Value Ref Range   WBC 10.4 3.6 - 11.0 K/uL   RBC 4.58 3.80 - 5.20 MIL/uL   Hemoglobin 12.2 12.0 - 16.0 g/dL   HCT 36.0 35.0 -  47.0 %   MCV 78.6 (L) 80.0 - 100.0 fL   MCH 26.7 26.0 - 34.0 pg   MCHC 34.0 32.0 - 36.0 g/dL   RDW 14.5 11.5 - 14.5 %   Platelets 213 150 - 440 K/uL  Comprehensive metabolic panel     Status: Abnormal   Collection Time: 12/08/15  5:50 PM  Result Value Ref Range   Sodium 135 135 - 145 mmol/L   Potassium 3.8 3.5 - 5.1 mmol/L   Chloride  107 101 - 111 mmol/L   CO2 22 22 - 32 mmol/L   Glucose, Bld 90 65 - 99 mg/dL   BUN 7 6 - 20 mg/dL   Creatinine, Ser 0.58 0.44 - 1.00 mg/dL   Calcium 8.6 (L) 8.9 - 10.3 mg/dL   Total Protein 6.6 6.5 - 8.1 g/dL   Albumin 3.0 (L) 3.5 - 5.0 g/dL   AST 11 (L) 15 - 41 U/L   ALT 11 (L) 14 - 54 U/L   Alkaline Phosphatase 94 38 - 126 U/L   Total Bilirubin 0.4 0.3 - 1.2 mg/dL   GFR calc non Af Amer >60 >60 mL/min   GFR calc Af Amer >60 >60 mL/min   Anion gap 6 5 - 15  Type and screen     Status: None   Collection Time: 12/08/15  5:50 PM  Result Value Ref Range   ABO/RH(D) O POS    Antibody Screen NEG    Sample Expiration 12/11/2015    Baseline FHR: 130 with accelerations to 150s, moderate variability, no decelerations Toco: occasional ctx   Assessment:  CALLYN SEVERTSON is a 35 y.o. G9P0100 female at 36w6dwith preeclampsia with severe features-based on blood pressures and headaches with scotomata  Plan:  1. Admit to Labor & Delivery for induction of labor for preeclampsia with severe features. 1. Will start with Cervidil since very unripe cervix 2. Already started magnesium sulfate for seizure PPX 3. GBS culture done-start GBS PPX with Clindamycin 4. BMZ 12.5 mgm x2 doses 5. Labetalol IV for sustained blood pressures in severe range   2. Continuous fetal monitoring 3. Blood pressures q1hr 4. Consents obtained. 5. Consulted Dr HKenton Kingfisherregarding POM. Explained to patient risks of continued observation/ possible sequelae of preeclampsia vs the risks of prematurity (respiratory distress, hypoglycemia, jaundice, hypothermia, feeding difficulties).  Patient wishes to proceed with IOL. Discussed unripe cervix and need for serial induction.  6. Desires BTL (signed papers 12/5)  Michaeal Davis  12/08/2015 10:28 PM

## 2015-12-08 NOTE — OB Triage Note (Signed)
Sent from office for [facility] evaluation. Pt complaining of seeing stars and frontal headache.

## 2015-12-09 ENCOUNTER — Encounter: Payer: Self-pay | Admitting: Anesthesiology

## 2015-12-09 MED ORDER — MISOPROSTOL 25 MCG QUARTER TABLET
25.0000 ug | ORAL_TABLET | ORAL | Status: DC | PRN
  Administered 2015-12-09 – 2015-12-10 (×3): 25 ug via VAGINAL
  Filled 2015-12-09: qty 1

## 2015-12-09 MED ORDER — MISOPROSTOL 25 MCG QUARTER TABLET
ORAL_TABLET | ORAL | Status: AC
Start: 2015-12-09 — End: 2015-12-10
  Filled 2015-12-09: qty 0.25

## 2015-12-09 MED ORDER — BETAMETHASONE SOD PHOS & ACET 6 (3-3) MG/ML IJ SUSP
12.0000 mg | Freq: Once | INTRAMUSCULAR | Status: AC
  Administered 2015-12-10: 12 mg via INTRAMUSCULAR
  Filled 2015-12-09: qty 1

## 2015-12-09 MED ORDER — MISOPROSTOL 25 MCG QUARTER TABLET
ORAL_TABLET | ORAL | Status: AC
Start: 2015-12-09 — End: 2015-12-10
  Administered 2015-12-10: 25 ug
  Filled 2015-12-09: qty 0.25

## 2015-12-09 MED ORDER — TERBUTALINE SULFATE 1 MG/ML IJ SOLN: 0.2500 mg | Freq: Once | INTRAMUSCULAR | Status: DC | PRN

## 2015-12-09 NOTE — Progress Notes (Addendum)
Daily [facility] Note  Patient ID: Brenda Todd, female   DOB: Apr 17, 1981, 35 y.o.   MRN: [id number] Labor Check  Subj:  Complaints: none   Obj:  BP 105/57 mmHg  Pulse 101  Temp(Src) 97.9 F (36.6 C) (Oral)  Resp 20  Ht 5\' 3"  (1.6 m)  Wt 88.451 kg (195 lb)  BMI 34.55 kg/m2  LMP 04/06/2015    Cervix: Dilation: Closed / Effacement (%): 50 / Station: Ballotable  Baseline FHR: 130    Variability: moderate    Accelerations: present    Decelerations: absent Contractions: present frequency: irregular 2-3 q 10 min  A/P: 35 y.o. G2P0100 female at [redacted]w[redacted]d with preeclampsia with severe features.  1.  Labor: cervidil removed. Will allow to eat breakfast.  Discussed misoprostol. Discussed r/b/a. Discussed that does not have an FDA approval for induction, though widely used and safe for  This purpose. She agrees to proceed.  2.  FWB: reassuring, Overall assessment: category 1  3.  GBS unknown  4.  Pain: prn 5.  Recheck: prn   Prentice Docker, MD 12/09/2015 8:49 AM

## 2015-12-09 NOTE — Anesthesia Preprocedure Evaluation (Deleted)
Anesthesia Evaluation  Patient identified by MRN, date of birth, ID band Patient awake    Reviewed: Allergy & Precautions, H&P , NPO status , Patient's Chart, lab work & pertinent test results, reviewed documented beta blocker date and time   Airway Mallampati: III  TM Distance: >3 FB Neck ROM: full    Dental no notable dental hx. (+) Teeth Intact   Pulmonary neg pulmonary ROS, former smoker,    Pulmonary exam normal breath sounds clear to auscultation       Cardiovascular Exercise Tolerance: Good hypertension, negative cardio ROS Normal cardiovascular exam Rhythm:regular Rate:Normal     Neuro/Psych negative neurological ROS  negative psych ROS   GI/Hepatic negative GI ROS, Neg liver ROS,   Endo/Other  negative endocrine ROS  Renal/GU Renal diseasenegative Renal ROS  negative genitourinary   Musculoskeletal   Abdominal   Peds  Hematology negative hematology ROS (+)   Anesthesia Other Findings   Reproductive/Obstetrics (+) Pregnancy                             Anesthesia Physical Anesthesia Plan  ASA: II  Anesthesia Plan: Regional and Epidural   Post-op Pain Management:    Induction:   Airway Management Planned:   Additional Equipment:   Intra-op Plan:   Post-operative Plan:   Informed Consent: I have reviewed the patients History and Physical, chart, labs and discussed the procedure including the risks, benefits and alternatives for the proposed anesthesia with the patient or authorized representative who has indicated his/her understanding and acceptance.     Plan Discussed with: CRNA  Anesthesia Plan Comments:         Anesthesia Quick Evaluation

## 2015-12-10 ENCOUNTER — Encounter: Payer: Self-pay | Admitting: Obstetrics and Gynecology

## 2015-12-10 LAB — COMPREHENSIVE METABOLIC PANEL
ALK PHOS: 86 U/L (ref 38–126)
ALT: 11 U/L — AB (ref 14–54)
ALT: 13 U/L — AB (ref 14–54)
AST: 12 U/L — AB (ref 15–41)
AST: 13 U/L — AB (ref 15–41)
Albumin: 3 g/dL — ABNORMAL LOW (ref 3.5–5.0)
Albumin: 3.1 g/dL — ABNORMAL LOW (ref 3.5–5.0)
Alkaline Phosphatase: 82 U/L (ref 38–126)
Anion gap: 9 (ref 5–15)
Anion gap: 7 (ref 5–15)
BILIRUBIN TOTAL: 0.3 mg/dL (ref 0.3–1.2)
BUN: 6 mg/dL (ref 6–20)
BUN: 7 mg/dL (ref 6–20)
CALCIUM: 7.7 mg/dL — AB (ref 8.9–10.3)
CHLORIDE: 108 mmol/L (ref 101–111)
CHLORIDE: 106 mmol/L (ref 101–111)
CO2: 22 mmol/L (ref 22–32)
CO2: 22 mmol/L (ref 22–32)
CREATININE: 0.56 mg/dL (ref 0.44–1.00)
CREATININE: 0.64 mg/dL (ref 0.44–1.00)
Calcium: 7.9 mg/dL — ABNORMAL LOW (ref 8.9–10.3)
Glucose, Bld: 130 mg/dL — ABNORMAL HIGH (ref 65–99)
Glucose, Bld: 130 mg/dL — ABNORMAL HIGH (ref 65–99)
POTASSIUM: 3.7 mmol/L (ref 3.5–5.1)
Potassium: 4.5 mmol/L (ref 3.5–5.1)
SODIUM: 139 mmol/L (ref 135–145)
Sodium: 135 mmol/L (ref 135–145)
TOTAL PROTEIN: 6.8 g/dL (ref 6.5–8.1)
Total Bilirubin: 0.2 mg/dL — ABNORMAL LOW (ref 0.3–1.2)
Total Protein: 6.4 g/dL — ABNORMAL LOW (ref 6.5–8.1)

## 2015-12-10 LAB — CBC
HCT: 36.7 % (ref 35.0–47.0)
HEMATOCRIT: 34.9 % — AB (ref 35.0–47.0)
HEMOGLOBIN: 11.6 g/dL — AB (ref 12.0–16.0)
Hemoglobin: 12 g/dL (ref 12.0–16.0)
MCH: 26.1 pg (ref 26.0–34.0)
MCH: 25.6 pg — ABNORMAL LOW (ref 26.0–34.0)
MCHC: 33.2 g/dL (ref 32.0–36.0)
MCHC: 32.7 g/dL (ref 32.0–36.0)
MCV: 78.6 fL — AB (ref 80.0–100.0)
MCV: 78.2 fL — AB (ref 80.0–100.0)
PLATELETS: 241 10*3/uL (ref 150–440)
Platelets: 208 10*3/uL (ref 150–440)
RBC: 4.44 MIL/uL (ref 3.80–5.20)
RBC: 4.69 MIL/uL (ref 3.80–5.20)
RDW: 14.5 % (ref 11.5–14.5)
RDW: 14.8 % — AB (ref 11.5–14.5)
WBC: 9.2 10*3/uL (ref 3.6–11.0)
WBC: 11.5 10*3/uL — AB (ref 3.6–11.0)

## 2015-12-10 LAB — CULTURE, BETA STREP (GROUP B ONLY)

## 2015-12-10 LAB — RPR: RPR Ser Ql: NONREACTIVE

## 2015-12-10 MED ORDER — MISOPROSTOL 25 MCG QUARTER TABLET
ORAL_TABLET | ORAL | Status: AC
  Administered 2015-12-10: 25 ug via VAGINAL
  Filled 2015-12-10: qty 0.25

## 2015-12-10 MED ORDER — SODIUM CHLORIDE 0.9 % IJ SOLN
INTRAMUSCULAR | Status: AC
  Filled 2015-12-10: qty 6

## 2015-12-10 MED ORDER — TERBUTALINE SULFATE 1 MG/ML IJ SOLN: 0.2500 mg | Freq: Once | INTRAMUSCULAR | Status: DC | PRN

## 2015-12-10 MED ORDER — OXYTOCIN 40 UNITS IN LACTATED RINGERS INFUSION - SIMPLE MED
1.0000 m[IU]/min | INTRAVENOUS | Status: DC
  Administered 2015-12-10: 1 m[IU]/min via INTRAVENOUS

## 2015-12-10 MED ORDER — SODIUM CHLORIDE 0.9 % IJ SOLN
INTRAMUSCULAR | Status: AC
  Filled 2015-12-10: qty 50

## 2015-12-10 MED ORDER — CALCIUM CARBONATE ANTACID 500 MG PO CHEW
500.0000 mg | CHEWABLE_TABLET | Freq: Three times a day (TID) | ORAL | Status: AC
  Administered 2015-12-11 (×3): 500 mg via ORAL
  Filled 2015-12-10: qty 3
  Filled 2015-12-10: qty 1
  Filled 2015-12-10: qty 2
  Filled 2015-12-10: qty 1

## 2015-12-10 MED ORDER — BUTORPHANOL TARTRATE 1 MG/ML IJ SOLN
1.0000 mg | INTRAMUSCULAR | Status: AC | PRN
  Administered 2015-12-10 (×3): 1 mg via INTRAVENOUS
  Filled 2015-12-10 (×3): qty 1

## 2015-12-10 NOTE — Progress Notes (Addendum)
L&D Note  12/10/2015 - 12:33 PM  35 y.o. G2P0100 [redacted]w[redacted]d (EDC 2/7, dated by 6wk u/s). Pregnancy complicated by cHTN, h/o proteinuria, h/o left renal stone, poor patient compliance, lack of PNC b/w 22-30wks, THC use, h/o pyelo, h/o 32wk IUFD, ?enlarged fetal CSP  Brenda Todd is admitted for IOL for cHTN with superimposed pre-eclampsia (BPs and neuro s/s)   Subjective:  Patient sleeping comfortably   Objective:    Current Vital Signs 24h Vital Sign Ranges  T 98.6 F (37 C) Temp  Avg: 98.5 F (36.9 C)  Min: 98.3 F (36.8 C)  Max: 98.6 F (37 C)  BP 130/72 mmHg BP  Min: 100/56  Max: 137/83  HR 77 Pulse  Avg: 86.8  Min: 77  Max: 99  RR 18 Resp  Avg: 18.5  Min: 18  Max: 20  SaO2 100 %   SpO2  Avg: 99.5 %  Min: 99 %  Max: 100 %       24 Hour I/O Current Shift I/O  Time Ins Outs 01/03 0701 - 01/04 0700 In: 1723.8 [P.O.:240; I.V.:1483.8] Out: 2361 [Urine:2361] 01/04 0701 - 01/04 1900 In: -  Out: 325 [Urine:325]   Filed Vitals:   12/10/15 0430 12/10/15 0530 12/10/15 0930 12/10/15 1030  BP: 125/74 108/68 133/76 130/72  Pulse: 79 81 79 77  Temp:   98.6 F (37 C)   TempSrc:   Oral   Resp:      Height:      Weight:      SpO2:        Temp:  [98.3 F (36.8 C)-98.6 F (37 C)] 98.6 F (37 C) (01/04 0930) Pulse Rate:  [77-99] 77 (01/04 1030) Resp:  [18-20] 18 (01/03 1910) BP: (100-137)/(54-92) 130/72 mmHg (01/04 1030) SpO2:  [99 %-100 %] 100 % (01/03 2045) I/O last 3 completed shifts: In: 3967.3 [P.O.:970; I.V.:2897.3; IV Piggyback:100] Out: [id number] [Urine:4061] Total I/O In: -  Out: 325 [Urine:325]  Intake/Output Summary (Last 24 hours) at 12/10/15 1233 Last data filed at 12/10/15 0900  Gross per 24 hour  Intake 980.75 ml  Output   2336 ml  Net -1355.25 ml     Current Vital Signs 24h Vital Sign Ranges  T 98.6 F (37 C) Temp  Avg: 98.5 F (36.9 C)  Min: 98.3 F (36.8 C)  Max: 98.6 F (37 C)  BP 130/72 mmHg BP  Min: 100/56  Max: 137/83  HR 77 Pulse  Avg: 86.8   Min: 77  Max: 99  RR 18 Resp  Avg: 18.5  Min: 18  Max: 20  SaO2 100 %   SpO2  Avg: 99.5 %  Min: 99 %  Max: 100 %       24 Hour I/O Current Shift I/O  Time Ins Outs 01/03 0701 - 01/04 0700 In: 1723.8 [P.O.:240; I.V.:1483.8] Out: 2361 [Urine:2361] 01/04 0701 - 01/04 1900 In: -  Out: 325 [Urine:325]   Patient Vitals for the past 12 hrs:  BP Temp Temp src Pulse  12/10/15 1030 130/72 mmHg - - 77  12/10/15 0930 133/76 mmHg 98.6 F (37 C) Oral 79  12/10/15 0530 108/68 mmHg - - 81  12/10/15 0430 125/74 mmHg - - 79  12/10/15 0330 107/66 mmHg - - 98  12/10/15 0230 109/60 mmHg - - 80  12/10/15 0130 137/83 mmHg - - 84    FHR: 135 baseline, +accels, remote subtle lates corrected with positioning, mod var Toco: q3-31m Gen: NAD SVE: FT/50, 259mL UOP  in foley Using sterile speculum and technique FB placed and inflated with 64mL of fluid and taped to tension SCDs in place  Recent Labs Lab 12/08/15 1750 12/10/15 0924  WBC 10.4 9.2  HGB 12.2 11.6*  HCT 36.0 34.9*  PLT 213 208    Recent Labs Lab 12/08/15 1750 12/10/15 0924  NA 135 139  K 3.8 3.7  CL 107 108  CO2 22 22  BUN 7 6  CREATININE 0.58 0.56  CALCIUM 8.6* 7.9*  PROT 6.6 6.4*  BILITOT 0.4 0.2*  ALKPHOS 94 82  ALT 11* 11*  AST 11* 12*  GLUCOSE 90 130*   PC 260 UDS: +THC Pending: GBS Medications Current Facility-Administered Medications  Medication Dose Route Frequency Provider Last Rate Last Dose  . acetaminophen (TYLENOL) tablet 1,000 mg  1,000 mg Oral Q6H PRN Dalia Heading, CNM   1,000 mg at 12/09/15 2034  . betamethasone acetate-betamethasone sodium phosphate (CELESTONE) injection 12 mg  12 mg Intramuscular Once Dalia Heading, CNM   12 mg at 12/09/15 1152  . calcium carbonate (TUMS - dosed in mg elemental calcium) chewable tablet 500 mg of elemental calcium  500 mg of elemental calcium Oral TID Aletha Halim, MD      . clindamycin (CLEOCIN) IVPB 900 mg  900 mg Intravenous 3 times per day Dalia Heading, CNM   900 mg at 12/10/15 0435  . labetalol (NORMODYNE) tablet 200 mg  200 mg Oral BID Dalia Heading, CNM   200 mg at 12/09/15 2211  . labetalol (NORMODYNE,TRANDATE) injection 20-80 mg  20-80 mg Intravenous Q10 min PRN Dalia Heading, CNM      . lactated ringers infusion 500-1,000 mL  500-1,000 mL Intravenous PRN Dalia Heading, CNM 125 mL/hr at 12/08/15 1841 1,000 mL at 12/08/15 1841  . lactated ringers infusion 500-1,000 mL  500-1,000 mL Intravenous PRN Dalia Heading, CNM      . lactated ringers infusion   Intravenous Continuous Aletha Halim, MD 75 mL/hr at 12/10/15 0915 75 mL/hr at 12/10/15 0915  . lidocaine (PF) (XYLOCAINE) 1 % injection 30 mL  30 mL Subcutaneous PRN Dalia Heading, CNM      . magnesium sulfate 40 g in lactated ringers 500 mL (0.08 g/mL) OB infusion  2 g/hr Intravenous Continuous Aletha Halim, MD 25 mL/hr at 12/10/15 0920 2 g/hr at 12/10/15 0920  . misoprostol (CYTOTEC) tablet 25 mcg  25 mcg Vaginal Q4H PRN Will Bonnet, MD   25 mcg at 12/10/15 0541  . ondansetron (ZOFRAN) injection 4 mg  4 mg Intravenous Q6H PRN Dalia Heading, CNM      . oxytocin (PITOCIN) IV BOLUS FROM BAG  500 mL Intravenous Continuous Dalia Heading, CNM      . oxytocin (PITOCIN) IV infusion 40 units in LR 1000 mL  62.5 mL/hr Intravenous Continuous Dalia Heading, CNM      . sodium chloride 0.9 % injection           . sodium chloride 0.9 % injection           . terbutaline (BRETHINE) injection 0.25 mg  0.25 mg Subcutaneous Once PRN Will Bonnet, MD        Assessment & Plan:  Pt stable *IUP: currently category I with accles *PEC with severe features:  -cont Mg at 2g/h -repeat HELLP labs in evening *IOL: add low dose pitocin if UCs spaced out enough to allow. Removed FB in approx 12hrs -s/p cervidil and miso x 4.  *GBS: unknown. Continue on clinda  and f/u swab *Preterm: received BMZ#1 but unknown why not #2 yesterday but pt okay with getting it  now. -peds aware.  *FEN/GI: calcium carbonate ordered for slightly low corrected calcium. Clear liquids. *PPx: SCDs ordered *Analgesia: no current needs  Aletha Halim, Brooke Bonito MD [facility] Pager [phone]

## 2015-12-10 NOTE — Progress Notes (Addendum)
L&D Note  12/10/2015 - 10:29 PM  35 y.o. G2P0100 [redacted]w[redacted]d (EDC 2/7, dated by 6wk u/s). Pregnancy complicated by cHTN, h/o proteinuria, h/o left renal stone, poor patient compliance, lack of PNC b/w 22-30wks, THC use, h/o pyelo, h/o 32wk IUFD, ?enlarged fetal CSP  Brenda Todd is admitted for IOL for cHTN with superimposed pre-eclampsia (BPs and neuro s/s)   Subjective:  Patient resting comfortably but feeling more intense UCs. No HA, visual s/s, chest pain, sob   Objective:    Current Vital Signs 24h Vital Sign Ranges  T 98.5 F (36.9 C) Temp  Avg: 98.4 F (36.9 C)  Min: 97.9 F (36.6 C)  Max: 98.6 F (37 C)  BP 137/76 mmHg BP  Min: 100/56  Max: 137/76  HR 83 Pulse  Avg: 83.4  Min: 77  Max: 98  RR 16 Resp  Avg: 16  Min: 16  Max: 16  SaO2 100 %  /RA No Data Recorded       24 Hour I/O Current Shift I/O  Time Ins Outs 01/03 0701 - 01/04 0700 In: 1723.8 [P.O.:240; I.V.:1483.8] Out: 2361 [Urine:2361] 01/04 1901 - 01/05 0700 In: 297 [I.V.:247] Out: 900 [Urine:900]   Patient Vitals for the past 12 hrs:  BP Temp Temp src Pulse Resp  12/10/15 2159 - 98.5 F (36.9 C) Oral - -  12/10/15 2130 137/76 mmHg - - 83 -  12/10/15 2030 135/76 mmHg - - 86 -  12/10/15 1930 133/80 mmHg 98.1 F (36.7 C) Oral 87 16  12/10/15 1830 120/64 mmHg - - 86 -  12/10/15 1730 129/83 mmHg - - 84 -  12/10/15 1630 134/80 mmHg - - 78 -  12/10/15 1530 134/84 mmHg 97.9 F (36.6 C) Oral 77 -  12/10/15 1430 131/78 mmHg - - 80 -  12/10/15 1330 131/80 mmHg 98.5 F (36.9 C) Oral 84 -  12/10/15 1230 128/83 mmHg - - 80 -  12/10/15 1132 136/76 mmHg 98.5 F (36.9 C) Axillary 78 -  12/10/15 1030 130/72 mmHg - - 77 -   UOP: >160mL/hr  FHR: 135 baseline, +accels, no decels, mod var Toco: q2-67m Gen: NAD RRR no MRGs CTAB 2+ brachial SVE: RN just tugged on FB and still in place SCDs in place GU: foley with approx 267mL of UOP in bag  Recent Labs Lab 12/08/15 1750 12/10/15 0924  WBC 10.4 9.2  HGB 12.2  11.6*  HCT 36.0 34.9*  PLT 213 208    Recent Labs Lab 12/08/15 1750 12/10/15 0924  NA 135 139  K 3.8 3.7  CL 107 108  CO2 22 22  BUN 7 6  CREATININE 0.58 0.56  CALCIUM 8.6* 7.9*  PROT 6.6 6.4*  BILITOT 0.4 0.2*  ALKPHOS 94 82  ALT 11* 11*  AST 11* 12*  GLUCOSE 90 130*   PC 260 UDS: +THC GBS neg Medications Current Facility-Administered Medications  Medication Dose Route Frequency Provider Last Rate Last Dose  . acetaminophen (TYLENOL) tablet 1,000 mg  1,000 mg Oral Q6H PRN Dalia Heading, CNM   1,000 mg at 12/10/15 1236  . calcium carbonate (TUMS - dosed in mg elemental calcium) chewable tablet 500 mg of elemental calcium  500 mg of elemental calcium Oral TID Aletha Halim, MD      . labetalol (NORMODYNE) tablet 200 mg  200 mg Oral BID Dalia Heading, CNM   200 mg at 12/10/15 2157  . lactated ringers infusion 500-1,000 mL  500-1,000 mL Intravenous PRN Dalia Heading, CNM  125 mL/hr at 12/08/15 1841 1,000 mL at 12/08/15 1841  . lactated ringers infusion 500-1,000 mL  500-1,000 mL Intravenous PRN Dalia Heading, CNM      . lactated ringers infusion   Intravenous Continuous Aletha Halim, MD 64.5 mL/hr at 12/10/15 2200    . lidocaine (PF) (XYLOCAINE) 1 % injection 30 mL  30 mL Subcutaneous PRN Dalia Heading, CNM      . magnesium sulfate 40 g in lactated ringers 500 mL (0.08 g/mL) OB infusion  2 g/hr Intravenous Continuous Aletha Halim, MD 25 mL/hr at 12/10/15 0920 2 g/hr at 12/10/15 0920  . ondansetron (ZOFRAN) injection 4 mg  4 mg Intravenous Q6H PRN Dalia Heading, CNM      . oxytocin (PITOCIN) IV BOLUS FROM BAG  500 mL Intravenous Continuous Dalia Heading, CNM      . oxytocin (PITOCIN) IV infusion 40 units in LR 1000 mL - Premix  1-40 milli-units/min Intravenous Titrated Aletha Halim, MD 10.5 mL/hr at 12/10/15 2200 7 milli-units/min at 12/10/15 2200  . oxytocin (PITOCIN) IV infusion 40 units in LR 1000 mL  62.5 mL/hr Intravenous Continuous  Dalia Heading, CNM      . sodium chloride 0.9 % injection           . terbutaline (BRETHINE) injection 0.25 mg  0.25 mg Subcutaneous Once PRN Will Bonnet, MD      . terbutaline (BRETHINE) injection 0.25 mg  0.25 mg Subcutaneous Once PRN Aletha Halim, MD        Assessment & Plan:  Pt stable *IUP: currently category I with accels *PEC with severe features:  -cont Mg at 2g/h -cont bid labetalol -repeat HELLP labs ordered *IOL: continue with pitocin with FB in place. Will remove 0300 and do SVE and continue with pitocin. D/w pt regarding AROM as soon as safe enough -s/p cervidil and miso x 4.  *GBS neg: clinda stopped.  *Preterm: s/p BMZ course -peds aware.  *FEN/GI: continue PO calcium and clear liquids *PPx: SCDs  *Analgesia: IV prns and epidural prn.   Aletha Halim, Brooke Bonito MD [place] Pager [phone]

## 2015-12-10 NOTE — Progress Notes (Signed)
L&D Note Fetus category I with accels. UCs have spaced out and are irregular with FB in place. Will start pitocin at 14mU and continue per protocol.  Durene Romans MD [facility]  Pager: [phone]

## 2015-12-10 NOTE — Progress Notes (Signed)
L&D Note  12/10/2015 - 9:13 AM  34 y.o. G2P0100 [redacted]w[redacted]d (EDC 2/7, dated by 6wk u/s). Pregnancy complicated by cHTN, h/o proteinuria, h/o left renal stone, poor patient compliance, lack of PNC b/w 22-30wks, THC use, h/o pyelo, h/o 32wk IUFD, ?enlarged fetal CSP  Brenda Todd is admitted for IOL for cHTN with superimposed pre-eclampsia (BPs and neuro s/s)   Subjective:  Patient had IV infiltrated and has been off Mg for 1.5hrs while IV has been trying to get started. Last provider said okay to shower and eat so patient finishing regular breakfast.  No current neuro s/s with last HA yesterday. Also no chest pain, SOB and not feeling any UCs.   Objective:    Current Vital Signs 24h Vital Sign Ranges  T 98.6 F (37 C) Temp  Avg: 98.3 F (36.8 C)  Min: 98 F (36.7 C)  Max: 98.6 F (37 C)  BP 108/68 mmHg BP  Min: 100/56  Max: 140/87  HR 81 Pulse  Avg: 89.7  Min: 79  Max: 103  RR 18 Resp  Avg: 18.7  Min: 18  Max: 20  SaO2 100 %   SpO2  Avg: 99.5 %  Min: 99 %  Max: 100 %       24 Hour I/O Current Shift I/O  Time Ins Outs 01/03 0701 - 01/04 0700 In: 1723.8 [P.O.:240; I.V.:1483.8] Out: 1961 [Urine:1961]     Patient Vitals for the past 24 hrs:  BP Temp Temp src Pulse Resp SpO2  12/10/15 0530 108/68 mmHg - - 81 - -  12/10/15 0430 125/74 mmHg - - 79 - -  12/10/15 0330 107/66 mmHg - - 98 - -  12/10/15 0230 109/60 mmHg - - 80 - -  12/10/15 0130 137/83 mmHg - - 84 - -  12/10/15 0030 136/78 mmHg - - 89 - -  12/09/15 2330 (!) 137/92 mmHg - - 92 - -  12/09/15 2230 (!) 100/56 mmHg - - 89 - -  12/09/15 2211 115/72 mmHg - - 99 - -  12/09/15 2128 115/72 mmHg - - 84 - -  12/09/15 2045 - - - - - 100 %  12/09/15 2030 - - - - - 99 %  12/09/15 2027 125/77 mmHg - - 83 - -  12/09/15 1914 - 98.6 F (37 C) Oral - - -  12/09/15 1912 129/81 mmHg - - 81 - -  12/09/15 1910 - 98.3 F (36.8 C) Oral - 18 -  12/09/15 1722 109/63 mmHg - - 86 18 -  12/09/15 1636 123/67 mmHg - - 87 20 -  12/09/15 1500  118/65 mmHg 98.3 F (36.8 C) Oral 96 18 -  12/09/15 1353 (!) 109/54 mmHg - - 98 - -  12/09/15 1220 118/61 mmHg 98 F (36.7 C) Oral 92 18 -  12/09/15 1109 122/75 mmHg - - 96 20 -  12/09/15 1030 140/87 mmHg - - 96 - -  12/09/15 1016 140/87 mmHg - - (!) 103 - -   FHR: 135 baseline, +accels, ? Rare slight variables,no decels, mod var Toco: q79m Gen: NAD, eating CTAB RRR no MRGs Abd: obese, gravid, nttp Neuro: 2+brachial b/l Ext: no c/c/e SVE: deferred. FT/50 @ 0540 when miso #4 was placed  Recent Labs Lab 12/08/15 1750  WBC 10.4  HGB 12.2  HCT 36.0  PLT 213    Recent Labs Lab 12/08/15 1750  NA 135  K 3.8  CL 107  CO2 22  BUN 7  CREATININE  0.58  CALCIUM 8.6*  PROT 6.6  BILITOT 0.4  ALKPHOS 94  ALT 11*  AST 11*  GLUCOSE 90   PC 260 UDS: +THC Pending: GBS Medications Current Facility-Administered Medications  Medication Dose Route Frequency Provider Last Rate Last Dose  . acetaminophen (TYLENOL) tablet 1,000 mg  1,000 mg Oral Q6H PRN Dalia Heading, CNM   1,000 mg at 12/09/15 2034  . betamethasone acetate-betamethasone sodium phosphate (CELESTONE) injection 12 mg  12 mg Intramuscular Once Dalia Heading, CNM   12 mg at 12/09/15 1152  . clindamycin (CLEOCIN) IVPB 900 mg  900 mg Intravenous 3 times per day Dalia Heading, CNM   900 mg at 12/10/15 0435  . labetalol (NORMODYNE) tablet 200 mg  200 mg Oral BID Dalia Heading, CNM   200 mg at 12/09/15 2211  . labetalol (NORMODYNE,TRANDATE) injection 20-80 mg  20-80 mg Intravenous Q10 min PRN Dalia Heading, CNM      . lactated ringers infusion 500-1,000 mL  500-1,000 mL Intravenous PRN Dalia Heading, CNM 125 mL/hr at 12/08/15 1841 1,000 mL at 12/08/15 1841  . lactated ringers infusion 500-1,000 mL  500-1,000 mL Intravenous PRN Dalia Heading, CNM      . lactated ringers infusion   Intravenous Continuous Aletha Halim, MD 112 mL/hr at 12/10/15 0000    . lidocaine (PF) (XYLOCAINE) 1 % injection 30 mL   30 mL Subcutaneous PRN Dalia Heading, CNM      . magnesium sulfate 40 g in lactated ringers 500 mL (0.08 g/mL) OB infusion  2 g/hr Intravenous Continuous Aletha Halim, MD 12.5 mL/hr at 12/08/15 2120 1 g/hr at 12/08/15 2120  . misoprostol (CYTOTEC) tablet 25 mcg  25 mcg Vaginal Q4H PRN Will Bonnet, MD   25 mcg at 12/10/15 0541  . ondansetron (ZOFRAN) injection 4 mg  4 mg Intravenous Q6H PRN Dalia Heading, CNM      . oxytocin (PITOCIN) IV BOLUS FROM BAG  500 mL Intravenous Continuous Dalia Heading, CNM      . oxytocin (PITOCIN) IV infusion 40 units in LR 1000 mL  62.5 mL/hr Intravenous Continuous Dalia Heading, CNM      . sodium chloride 0.9 % injection           . terbutaline (BRETHINE) injection 0.25 mg  0.25 mg Subcutaneous Once PRN Will Bonnet, MD        Assessment & Plan:  Pt stable *IUP: category I with accels most likely *PEC with severe features: I looked over Brenda Todd chart and there has been some question if she really has cHTN vs gHTN and she has cHTN.  See BP values below: 07/2011: 162/100 @ Brenda Todd annual 04/2012 145/93 in the ER 05/2015 161/104 in the ER 9/22 @ 20/2: 130/90 9/27 @ 21/0: 150/84 Based on these values, it's my belief that she has cHTN aka pre-existing HTN.  As to whether she has pre-eclampsia, in talking to Brenda Todd now, she did state that she had spots in Brenda Todd vision and a severe HA on the day she was seen in clinic and sent to L&D for admission, which she states got better when she got here but unknown if the HA was new onset worsening of Brenda Todd cHTN vs true neuro s/s, but it's unknown if it improved with BP control or the start of the Mg as BP control and Mg were started around the same time; also Brenda Todd admission labs were negative at that time, so I told Brenda Todd that since we've been treating you as severe  that we need to do all the measures with treatment, such as a foley, serial labs, clear liquid diet and 24h PP Mg. Pt is upset but understanding -continue on  po labetalol -will restart Mg at 2g/h -follow up cbc and cmp and get at least q12h -insert foley for better I/O. Has been at least 75/h thus far *IOL: will recheck at 1030-11am and if not ripe for pitocin will do foley bulb and pitocin.  -s/p cervidil and miso x 4.  *GBS: unknown. Continue on clinda and f/u swab *Preterm: received BMZ#1 but unknown why not #2 (highlighted in red so ?pt refusal). Will ask RN about this and give prn. Peds aware *PPx: SCDs ordered *Analgesia: no current needs  Aletha Halim, Brooke Bonito MD [facility] Pager [phone]

## 2015-12-11 MED ORDER — BUTORPHANOL TARTRATE 1 MG/ML IJ SOLN
INTRAMUSCULAR | Status: AC
  Administered 2015-12-11: 1 mg via INTRAVENOUS
  Filled 2015-12-11: qty 1

## 2015-12-11 MED ORDER — BUTORPHANOL TARTRATE 1 MG/ML IJ SOLN
1.0000 mg | INTRAMUSCULAR | Status: DC | PRN
  Administered 2015-12-11 (×2): 1 mg via INTRAVENOUS
  Filled 2015-12-11: qty 1

## 2015-12-11 MED ORDER — ZOLPIDEM TARTRATE 5 MG PO TABS
5.0000 mg | ORAL_TABLET | Freq: Every evening | ORAL | Status: DC | PRN
  Administered 2015-12-11 – 2015-12-14 (×3): 5 mg via ORAL
  Filled 2015-12-11 (×3): qty 1

## 2015-12-11 MED ORDER — FENTANYL CITRATE (PF) 100 MCG/2ML IJ SOLN: 50.0000 ug | INTRAMUSCULAR | Status: DC | PRN

## 2015-12-11 NOTE — Progress Notes (Signed)
Humphrey Rolls, RN received report, assumed care of patient. 7A shift

## 2015-12-11 NOTE — Progress Notes (Addendum)
Morning assessment completed. Pt. Asleep upon arrival to BS, pain 8/10 with c/o of lower back pain with contractions, reflex +2, no clonus, no RUQ pain, sufficient UOP (150 - 350 ml/hr). IV located in right AC, infusing 75 ml LR, 25 ml Mag. Sulfate, 12 mu of Pitocin.  Physical Assess WNL, generalized edema. Pt. Tolerating a clear liquid diet. Pt. States she is self medicating with Ranitidine 150 mg, BID, nurse instructed pt. Not to self medicate and she is scheduled to receive scheduled Calcium.  Pt. Verbalized understanding.

## 2015-12-11 NOTE — Progress Notes (Signed)
RN to the bedside to saline lock infusing IV (LR at 50 ml).  New orders received to transfer pt. To antepartum, MD completed BS U/S (see MD notes). Pt. Has been up to BR for post foley void - medium amount. Pt. Tolerated regular diet without emesis. Steady gait, pt. Up to shower with support person at the bedside.  Rn will transfer to Antepartum after shower.

## 2015-12-11 NOTE — Progress Notes (Addendum)
OB Note  EFM just pulled up and category II with minimal variability, subtle recurrent lates (since approx 0630), no accels, and RN asked to d/c pitocin, currently at 12, and do resuscictative measures such as IVF bolus, repositioning and PRN O2, as nothing has been done yet. UCs irregular at q1-25m but nothing consistent so will hold off on terb. Also, MAR opened and patient hasn't been calcium and RN unsure why but will start giving patient her calcium. Last stadol at 0500 and will d/c that as could be contributing to loss of variability along with Mg. Last SVE at 0400 and 3/50/-3. Normal VS and good UOP.   Once EFM reassuring, will repeat SVE and AROM if possible.   Durene Romans MD [facility]  Pager: [phone]

## 2015-12-11 NOTE — Progress Notes (Signed)
L&D Progress Note  S: No headache today. Sleeping much of morning. Dr Leonides Schanz spoke with Dr Jeneen Rinks from [facility] regarding POM. She recommended doing a OCT, and if negative, stopping induction and magnesium.   O: BP 115/74 to 133/77 on labetalol 200 mgm BID FHR: 130 with accelerations to 140s, mod variability Toco: Was started back on Pitocin this AM and her OCT was negative. At 8 miu/min Pitocin she had 3ctxs in 10 minutes. She slept through all of these contractions SVE before restarting Pitocin: Baby OOP and unable to reach cervix. (was3cm at 4AM) Ultrasound for presentation: cephalic/ROP  A: IUP at [id number] weeks with negative OCT. Normotensive and asymptomatic of preeclampsia Cat1 tracing  P: Per Dr Leonides Schanz: Pitocin and magnesium stopped Continue external monitors Regular diet  Brenda Todd, CNM

## 2015-12-11 NOTE — Progress Notes (Signed)
Antepartum progress note:  Due to the events as they have occurred throughout Ms.Rosato's stay, I have decided to stop her induction and keep her inpatient for close monitoring throughout the remainder of her pregnancy.    Support data is as follows: 1. Patient has been evidenced to have chronic hypertension as she has had multiple encounters in the past and during this pregnancy with elevated BPs: 07/2011: 162/100 @ her annual 04/2012 145/93 in the ER 05/2015 161/104 in the ER 9/22 @ 20/2: 130/90 9/27 @ 21/0: 150/84  2. Patient presented with complaints of headache with spots in her vision, which were used as criteria for the diagnosis of severe preeclampsia.  Upon further questioning patient states that the headache was primarily one-sided, and the spots were exclusive to the duration of her headache and limited to one eye.  Furthermore, the headache and spots resolved with hydration, sleep, and were absent within 12 hours of admission.  Pre-eclampsia headache and visual changes are due to brain edema and would not subside with the treatments mentioned.    3. Labetalol was initiated and since that time her blood pressures have been WNL.   4. No significant proteinuria, however 24-hour urine collection was not performed.  P/C ratio was <0.30.    5. Failed attempts to induce labor.  The risk to benefit ratio is heavily tilted to having a cesarean at this time due to the many various methods to induce labor, and still the baby - although cephalic - is not engaged in the pelvis.  Thus far she has received cervidil, cytotec, foley bulb, and pitocin.  With pitocin administration, patient subsequently received several doses of Stadol which led to suppression of fetal variability, and introduced late decelerations.  After cessation of pitocin and time to recover, variability improved and strip was again category 1.  A contraction stress test was performed and there were no worrisome deviations in the fetal  baseline to indicate uteroplacental insuffiency.  The strip maintained its baseline with 15x15 accelerations and no decelerations.  6. Ultrasound findings show S=D and adequate fluid (AFI 15cm).    For continued inpatient observation, the following are supportive of this decision: 1. Cervical and uterine manipulation - several days of cervical checks and instrumentation with foley bulb significantly increase patient's risk for chorioamnionitis.  This alone would be indication for induction/delivery. 2. Precarious pregnancy course/ poor compliance.  Daily monitoring with NSTs and BP checks are required, and should there be any deviation from stable, induction would be recommended. 3. Symptom observation/management 4. Continued laboratory management requiring twice weekly lab draws.  After discussion with [facility] physicians and Dr. Jeneen Rinks of [facility], I have elected to cancel Ms. Seefeld's induction, magnesium infusion, and acute L&D care, and transfer her to the antepartum floor for her continued inpatient care.    Plan  -Daily NSTs with BPPs should NST be non-reactive. -2x weekly LFTs,  Platelets, P/C ratio -24 hour urine weekly -Appointment with [facility] for consultation and management recommendations on Monday Jan 9th at 1:00.  Induction for maternal or fetal reasons may yet again be attempted; cesarean possibility discussed with patient and her mother.  Patient is agreeable and appreciative of this intervention.  ----- Larey Days, MD Attending Obstetrician and Gynecologist [facility]

## 2015-12-12 MED ORDER — BUTALBITAL-APAP-CAFFEINE 50-325-40 MG PO TABS
2.0000 | ORAL_TABLET | Freq: Four times a day (QID) | ORAL | Status: DC | PRN
  Administered 2015-12-12 – 2015-12-13 (×3): 2 via ORAL
  Filled 2015-12-12 (×4): qty 2

## 2015-12-12 MED ORDER — METOCLOPRAMIDE HCL 5 MG/ML IJ SOLN
10.0000 mg | Freq: Four times a day (QID) | INTRAMUSCULAR | Status: DC | PRN
  Administered 2015-12-12 – 2015-12-16 (×3): 10 mg via INTRAVENOUS
  Filled 2015-12-12 (×3): qty 2

## 2015-12-12 MED ORDER — DIPHENHYDRAMINE HCL 50 MG/ML IJ SOLN
25.0000 mg | Freq: Four times a day (QID) | INTRAMUSCULAR | Status: DC | PRN
  Administered 2015-12-12: 25 mg via INTRAVENOUS
  Filled 2015-12-12: qty 1

## 2015-12-12 NOTE — Progress Notes (Addendum)
Obstetric and Gynecology  Subjective  Headache pain now rated a 3/10 down from 7-8/10 since dose of Fioricet. Sitting up watching TV without complaints.  Objective   Filed Vitals:   12/12/15 1442 12/12/15 1637  BP: 139/84 138/91  Pulse:  76  Temp: 98.5 F (36.9 C) 98.1 F (36.7 C)  Resp:  18     Intake/Output Summary (Last 24 hours) at 12/12/15 1756 Last data filed at 12/12/15 0900  Gross per 24 hour  Intake    240 ml  Output      0 ml  Net    240 ml    General: NAD Abdomen: gravid, non-tender   Cultures: Results for orders placed or performed during the hospital encounter of 12/08/15  Culture, beta strep (group b only)     Status: None   Collection Time: 12/08/15 10:03 PM  Result Value Ref Range Status   Specimen Description VAGINAL/RECTAL  Final   Special Requests NONE  Final   Culture NO BETA HEMOLYTIC STREPTOCOCCI ISOLATED  Final   Report Status 12/10/2015 FINAL  Final    Imaging:  Assessment   35 y.o. G2P0100 at 35 weeks 3 days with worsening chronic hypertension. Most recent BP in normal range  Plan   1) Chronic hypertension - undiagnosed and initially treated as preeclampsia with severe features BP's has nicely responded to labetalol, labs have been normal.  She does have a migraine headache one sided with some nausea but no other symptoms classic for preeclampsia.   Dr. Jeneen Rinks of [facility] weighed in on the plan of care and recommended stopping induction if negative contraction stress test.  The patient has history of IUFD at 32 weeks as well.   - continue labetalol 200mg  po bid titrate up as warranted  2) Fetus - daily NST while inpatient, the fetus was noted to have a prominent slightly enlarged septum pellucidi on 32 week imaging.  Will have [facility] re-evalute on Monday 12/14/14 - s/p BMZ  3) DVT ppx - SCD's in bed  4) Desires BTL if delivered   5) Fioricet PRN for nagging headache   Cena Bruhn, CNM  12/12/15 1755  This patient and  plan were discussed with Dr Georgianne Fick  12/12/2015

## 2015-12-12 NOTE — Progress Notes (Signed)
Obstetric and Gynecology  Subjective  No overnight issues, nausea and headache improved.  +FM, no LOF, no VB, no ctx, no abdominal pain  Objective   Filed Vitals:   12/12/15 0300 12/12/15 0829  BP: 133/85 106/68  Pulse: 77 78  Temp:  98.7 F (37.1 C)  Resp: 19 20     Intake/Output Summary (Last 24 hours) at 12/12/15 [id number] Last data filed at 12/11/15 1232  Gross per 24 hour  Intake      0 ml  Output    450 ml  Net   -450 ml    General: NAD Cardiovascular:  RRR Abdomen: gravid, non-tender Extremities: no edema  Labs: No results found for this or any previous visit (from the past 24 hour(s)).  Cultures: Results for orders placed or performed during the hospital encounter of 12/08/15  Culture, beta strep (group b only)     Status: None   Collection Time: 12/08/15 10:03 PM  Result Value Ref Range Status   Specimen Description VAGINAL/RECTAL  Final   Special Requests NONE  Final   Culture NO BETA HEMOLYTIC STREPTOCOCCI ISOLATED  Final   Report Status 12/10/2015 FINAL  Final    Imaging:  Assessment   35 y.o. G2P0100 at 35 weeks 3 days with worsening chronic hypertension  Plan   1) Chronic hypertension - undiagnosed and initially treated as preeclampsia with severe features BP's has nicely responded to labetalol, labs have been normal.  She does have a migraine headache one sided with some nausea but no other symptoms classic for preeclampsia.   Dr. Jeneen Rinks of [facility] weighed in on the plan of care and recommended stopping induction if negative contraction stress test.  The patient has history of IUFD at 32 weeks as well.   - continue labetalol 200mg  po bid titrate up as warranted  2) Fetus - daily NST while inpatient, the fetus was noted to have a prominent slightly enlarged septum pellucidi on 32 week imaging.  Will have [facility]evalute on Monday 12/14/14 - s/p BMZ  3) DVT ppx - SCD's in bed  4) Desires BTL if delivered

## 2015-12-12 NOTE — Progress Notes (Signed)
Obstetric and Gynecology  Subjective  Complaint of nagging headache without visual disturbance. Pt was given 1000 mg tylenol at 1300 without relief. +FM, no LOF, no VB, no ctx, no abdominal pain.  Objective   Filed Vitals:   12/12/15 1250 12/12/15 1442  BP: 151/95 139/84  Pulse: 80   Temp:  98.5 F (36.9 C)  Resp:       Intake/Output Summary (Last 24 hours) at 12/12/15 1546 Last data filed at 12/12/15 0900  Gross per 24 hour  Intake    240 ml  Output      0 ml  Net    240 ml    General: NAD Cardiovascular:  RRR Abdomen: gravid, non-tender Extremities: no edema  Labs: No results found for this or any previous visit (from the past 24 hour(s)).  Cultures: Results for orders placed or performed during the hospital encounter of 12/08/15  Culture, beta strep (group b only)     Status: None   Collection Time: 12/08/15 10:03 PM  Result Value Ref Range Status   Specimen Description VAGINAL/RECTAL  Final   Special Requests NONE  Final   Culture NO BETA HEMOLYTIC STREPTOCOCCI ISOLATED  Final   Report Status 12/10/2015 FINAL  Final    Imaging:  Assessment   35 y.o. G2P0100 at 35 weeks 3 days with worsening chronic hypertension. Most recent BP in normal range  Plan   1) Chronic hypertension - undiagnosed and initially treated as preeclampsia with severe features BP's has nicely responded to labetalol, labs have been normal.  She does have a migraine headache one sided with some nausea but no other symptoms classic for preeclampsia.   Dr. Jeneen Rinks of [facility] weighed in on the plan of care and recommended stopping induction if negative contraction stress test.  The patient has history of IUFD at 32 weeks as well.   - continue labetalol 200mg  po bid titrate up as warranted  2) Fetus - daily NST while inpatient, the fetus was noted to have a prominent slightly enlarged septum pellucidi on 32 week imaging.  Will have [facility]-evalute on Monday 12/14/14 - s/p BMZ  3)  DVT ppx - SCD's in bed  4) Desires BTL if delivered   5) Fioricet for nagging headache   Rod Can, CNM  12/12/15 1543  This patient and plan were discussed with Dr Georgianne Fick  12/12/2015

## 2015-12-13 LAB — CBC
HEMATOCRIT: 36.4 % (ref 35.0–47.0)
HEMATOCRIT: 37.8 % (ref 35.0–47.0)
Hemoglobin: 11.9 g/dL — ABNORMAL LOW (ref 12.0–16.0)
Hemoglobin: 12.5 g/dL (ref 12.0–16.0)
MCH: 26.4 pg (ref 26.0–34.0)
MCH: 26.4 pg (ref 26.0–34.0)
MCHC: 32.8 g/dL (ref 32.0–36.0)
MCHC: 33.2 g/dL (ref 32.0–36.0)
MCV: 80.4 fL (ref 80.0–100.0)
MCV: 79.6 fL — AB (ref 80.0–100.0)
PLATELETS: 214 10*3/uL (ref 150–440)
PLATELETS: 245 10*3/uL (ref 150–440)
RBC: 4.53 MIL/uL (ref 3.80–5.20)
RBC: 4.75 MIL/uL (ref 3.80–5.20)
RDW: 14.7 % — AB (ref 11.5–14.5)
RDW: 14.5 % (ref 11.5–14.5)
WBC: 11.4 10*3/uL — AB (ref 3.6–11.0)
WBC: 10.6 10*3/uL (ref 3.6–11.0)

## 2015-12-13 LAB — COMPREHENSIVE METABOLIC PANEL
ALBUMIN: 2.7 g/dL — AB (ref 3.5–5.0)
ALK PHOS: 71 U/L (ref 38–126)
ALT: 12 U/L — ABNORMAL LOW (ref 14–54)
ALT: 13 U/L — ABNORMAL LOW (ref 14–54)
ANION GAP: 10 (ref 5–15)
AST: 12 U/L — AB (ref 15–41)
AST: 13 U/L — AB (ref 15–41)
Albumin: 3.1 g/dL — ABNORMAL LOW (ref 3.5–5.0)
Alkaline Phosphatase: 95 U/L (ref 38–126)
Anion gap: 6 (ref 5–15)
BILIRUBIN TOTAL: 0.2 mg/dL — AB (ref 0.3–1.2)
BILIRUBIN TOTAL: 0.6 mg/dL (ref 0.3–1.2)
BUN: 12 mg/dL (ref 6–20)
BUN: 17 mg/dL (ref 6–20)
CALCIUM: 8.2 mg/dL — AB (ref 8.9–10.3)
CHLORIDE: 106 mmol/L (ref 101–111)
CO2: 20 mmol/L — ABNORMAL LOW (ref 22–32)
CO2: 19 mmol/L — ABNORMAL LOW (ref 22–32)
CREATININE: 0.43 mg/dL — AB (ref 0.44–1.00)
Calcium: 8.7 mg/dL — ABNORMAL LOW (ref 8.9–10.3)
Chloride: 108 mmol/L (ref 101–111)
Creatinine, Ser: 0.65 mg/dL (ref 0.44–1.00)
GFR calc Af Amer: >60 mL/min (ref >60)
GFR calc Af Amer: >60 mL/min (ref >60)
GLUCOSE: 80 mg/dL (ref 65–99)
Glucose, Bld: 111 mg/dL — ABNORMAL HIGH (ref 65–99)
POTASSIUM: 4.2 mmol/L (ref 3.5–5.1)
POTASSIUM: 3.9 mmol/L (ref 3.5–5.1)
Sodium: 134 mmol/L — ABNORMAL LOW (ref 135–145)
Sodium: 135 mmol/L (ref 135–145)
TOTAL PROTEIN: 6.2 g/dL — AB (ref 6.5–8.1)
TOTAL PROTEIN: 6.7 g/dL (ref 6.5–8.1)

## 2015-12-13 LAB — TYPE AND SCREEN
ABO/RH(D): O POS
Antibody Screen: NEGATIVE

## 2015-12-13 LAB — PROTEIN / CREATININE RATIO, URINE
CREATININE, URINE: 129 mg/dL
PROTEIN CREATININE RATIO: 0.26 mg/mg{creat} — AB (ref 0.00–0.15)
TOTAL PROTEIN, URINE: 34 mg/dL

## 2015-12-13 LAB — PROTEIN, URINE, 24 HOUR
COLLECTION INTERVAL-UPROT: 24 h
PROTEIN, 24H URINE: 180 mg/d — AB (ref 50–100)
Protein, Urine: 10 mg/dL
URINE TOTAL VOLUME-UPROT: 1800 mL

## 2015-12-13 MED ORDER — SODIUM CHLORIDE 0.9 % IJ SOLN
INTRAMUSCULAR | Status: AC
  Filled 2015-12-13: qty 3

## 2015-12-13 MED ORDER — LABETALOL HCL 200 MG PO TABS
400.0000 mg | ORAL_TABLET | Freq: Two times a day (BID) | ORAL | Status: DC
  Administered 2015-12-13 – 2015-12-18 (×11): 400 mg via ORAL
  Filled 2015-12-13 (×11): qty 2

## 2015-12-13 MED ORDER — SODIUM CHLORIDE 0.9 % IJ SOLN
INTRAMUSCULAR | Status: AC
  Administered 2015-12-13: 10 mL
  Filled 2015-12-13: qty 3

## 2015-12-13 MED ORDER — HYDRALAZINE HCL 20 MG/ML IJ SOLN
INTRAMUSCULAR | Status: AC
  Administered 2015-12-13: 10 mg via INTRAVENOUS
  Filled 2015-12-13: qty 1

## 2015-12-13 MED ORDER — HYDRALAZINE HCL 20 MG/ML IJ SOLN
10.0000 mg | Freq: Once | INTRAMUSCULAR | Status: AC | PRN
  Administered 2015-12-13: 10 mg via INTRAVENOUS

## 2015-12-13 NOTE — Progress Notes (Signed)
Obstetric and Gynecology  Subjective  No overnight issues, no nausea and headache improved.  +FM, no LOF, no VB, occas mild-mod ctx but random in occurance, no abdominal pain Denies blurry vision, epigastric pain, edema, CP, SOB.  Objective   Filed Vitals:   12/13/15 0347 12/13/15 0809  BP: 156/87 146/90  Pulse: 80 77  Temp: 98.5 F (36.9 C) 98.4 F (36.9 C)  Resp: 20 18     Intake/Output Summary (Last 24 hours) at 12/13/15 0913 Last data filed at 12/13/15 0500  Gross per 24 hour  Intake    240 ml  Output    700 ml  Net   -460 ml    General: NAD Cardiovascular:  RRR Abdomen: gravid, non-tender, Vtx Extremities: no edema  Labs: Results for orders placed or performed during the hospital encounter of 12/08/15 (from the past 24 hour(s))  Comprehensive metabolic panel     Status: Abnormal   Collection Time: 12/13/15  6:36 AM  Result Value Ref Range   Sodium 134 (L) 135 - 145 mmol/L   Potassium 4.2 3.5 - 5.1 mmol/L   Chloride 108 101 - 111 mmol/L   CO2 20 (L) 22 - 32 mmol/L   Glucose, Bld 80 65 - 99 mg/dL   BUN 12 6 - 20 mg/dL   Creatinine, Ser 0.43 (L) 0.44 - 1.00 mg/dL   Calcium 8.2 (L) 8.9 - 10.3 mg/dL   Total Protein 6.2 (L) 6.5 - 8.1 g/dL   Albumin 2.7 (L) 3.5 - 5.0 g/dL   AST 12 (L) 15 - 41 U/L   ALT 12 (L) 14 - 54 U/L   Alkaline Phosphatase 71 38 - 126 U/L   Total Bilirubin 0.2 (L) 0.3 - 1.2 mg/dL   GFR calc non Af Amer >60 >60 mL/min   GFR calc Af Amer >60 >60 mL/min   Anion gap 6 5 - 15  CBC     Status: Abnormal   Collection Time: 12/13/15  6:36 AM  Result Value Ref Range   WBC 11.4 (H) 3.6 - 11.0 K/uL   RBC 4.53 3.80 - 5.20 MIL/uL   Hemoglobin 11.9 (L) 12.0 - 16.0 g/dL   HCT 36.4 35.0 - 47.0 %   MCV 80.4 80.0 - 100.0 fL   MCH 26.4 26.0 - 34.0 pg   MCHC 32.8 32.0 - 36.0 g/dL   RDW 14.7 (H) 11.5 - 14.5 %   Platelets 214 150 - 440 K/uL    Cultures: Results for orders placed or performed during the hospital encounter of 12/08/15  Culture, beta  strep (group b only)     Status: None   Collection Time: 12/08/15 10:03 PM  Result Value Ref Range Status   Specimen Description VAGINAL/RECTAL  Final   Special Requests NONE  Final   Culture NO BETA HEMOLYTIC STREPTOCOCCI ISOLATED  Final   Report Status 12/10/2015 FINAL  Final    Imaging: See [place] reports  Assessment   35 y.o. G2P0100 at 35 weeks 4 days with  chronic hypertension  Plan   1) Chronic hypertension - undiagnosed and initially treated as preeclampsia with severe features BP's has nicely responded to labetalol, labs have been normal.  She does have a migraine headache one sided with some nausea but no other symptoms classic for preeclampsia.   Dr. Jeneen Rinks of [facility] weighed in on the plan of care and recommended stopping induction if negative contraction stress test.  The patient has history of IUFD at 32 weeks as well.   -  continue labetalol 200mg  po bid titrate up as warranted - Monitor BPs closely  2) Fetus - fetus was noted to have a prominent slightly enlarged septum pellucidi on 32 week imaging.  Will have [facility]evalute on Monday 12/14/14 - s/p BMZ  3) DVT ppx - SCD's in bed  4) Desires BTL if delivered  Mutual plan for monitoring: -Daily NSTs with BPPs should NST be non-reactive. -2x weekly LFTs, Platelets, P/C ratio -24 hour urine weekly -Appointment with Page for consultation and management recommendations on Monday Jan 9th at 1:00.  Induction for maternal or fetal reasons may yet again be attempted; cesarean possibility discussed with patient and her mother. Patient is agreeable and appreciative of this intervention.

## 2015-12-14 NOTE — Progress Notes (Signed)
Progress Note  CTSP due to blood pressure elevations in severe range, headache accompanied by scotomata, and cramping/contractions that began after she went back to [facility] unit from having a reactive NST. Had developed a headache around 5 PM and was given Fioricet at that time without much relief. Patient states"I can not take this anymore!" referring to feeling badly and having severe headache that reaches from her forehead to her occiput and is accompanied by dark spots in her vision. Began feeling cramping/ contractions q10-15 min apart. NO VB or LOF Dr Kenton Kingfisher had just increased her labetalol from 200 mgm BID to 400 mgm BID earlier today  and she received the first dose at 1000 this AM. Just completed 24 hr urine for protein at 2030 tonight  O: BP's 160/100-107 prior to transfer to L&D After receiving 10 mgm of Apresoline IV, blood pressures came down and were in the normal to mild range. 129/69-155/78, but most 130s-140s/80s Still complaining of headache after blood pressures came down and some nausea General: alert, awake, frustrated as well as anxious Uterus NT Cephalic presentation on ultrasound Results for orders placed or performed during the hospital encounter of 12/08/15 (from the past 24 hour(s))  Protein, urine, 24 hour     Status: Abnormal   Collection Time: 12/13/15  4:50 PM  Result Value Ref Range   Urine Total Volume-UPROT 1800 mL   Collection Interval-UPROT 24 hours   Protein, Urine 10 mg/dL   Protein, 24H Urine 180 (H) 50 - 100 mg/day  CBC     Status: Abnormal   Collection Time: 12/13/15  7:51 PM  Result Value Ref Range   WBC 10.6 3.6 - 11.0 K/uL   RBC 4.75 3.80 - 5.20 MIL/uL   Hemoglobin 12.5 12.0 - 16.0 g/dL   HCT 37.8 35.0 - 47.0 %   MCV 79.6 (L) 80.0 - 100.0 fL   MCH 26.4 26.0 - 34.0 pg   MCHC 33.2 32.0 - 36.0 g/dL   RDW 14.5 11.5 - 14.5 %   Platelets 245 150 - 440 K/uL  Comprehensive metabolic panel     Status: Abnormal   Collection Time: 12/13/15  7:51 PM  Result  Value Ref Range   Sodium 135 135 - 145 mmol/L   Potassium 3.9 3.5 - 5.1 mmol/L   Chloride 106 101 - 111 mmol/L   CO2 19 (L) 22 - 32 mmol/L   Glucose, Bld 111 (H) 65 - 99 mg/dL   BUN 17 6 - 20 mg/dL   Creatinine, Ser 0.65 0.44 - 1.00 mg/dL   Calcium 8.7 (L) 8.9 - 10.3 mg/dL   Total Protein 6.7 6.5 - 8.1 g/dL   Albumin 3.1 (L) 3.5 - 5.0 g/dL   AST 13 (L) 15 - 41 U/L   ALT 13 (L) 14 - 54 U/L   Alkaline Phosphatase 95 38 - 126 U/L   Total Bilirubin 0.6 0.3 - 1.2 mg/dL   GFR calc non Af Amer >60 >60 mL/min   GFR calc Af Amer >60 >60 mL/min   Anion gap 10 5 - 15  Protein / creatinine ratio, urine     Status: Abnormal   Collection Time: 12/13/15  9:28 PM  Result Value Ref Range   Creatinine, Urine 129 mg/dL   Total Protein, Urine 34 mg/dL   Protein Creatinine Ratio 0.26 (H) 0.00 - 0.15 mg/mg[Cre]  FHR 130s with accelerations to 160s to 170s, moderate variability, no decelerations Toco: irregular, infrequent contractions Cervix: closed/50%/OOP  A: IUP at [id number]  CHTN with superimposed preeclampsia with severe features? (headache and scotomata?)  BPs responded well to apresoline/ labetalol Stable PIH labs Unripe cervix  P: Reglan and Fioricet now, then monitor for headache resolution and BPs q1hr If headache improves and BPs OK, can transfer back to [facility] unit. Dr Kenton Kingfisher made aware of increased BPs and treatment Await formal consult from [facility] on 1/9.  Dalia Heading, CNM

## 2015-12-14 NOTE — Progress Notes (Signed)
Dalia Heading, CNM at bedside discussing plan of care with patient.  If headache improves and blood pressures remain stable patient may be returned to mother baby unit.

## 2015-12-14 NOTE — Progress Notes (Signed)
Daily Progress Note:  S: Continues to have constant headache, but no scotomata. Fioricet really doesn't help. Baby active. Brought over to L&D for daily ultrasound  O: BP 128/86 mmHg  Pulse 88  Temp(Src) 98.4 F (36.9 C) (Oral)  Resp 18  Ht 5\' 3"  (1.6 m)  Wt 88.451 kg (195 lb)  BMI 34.55 kg/m2  SpO2 100%  LMP 04/06/2015  BPs on [facility] unit today in the 140s/86-95. Pulse in the 80s to 90s General : appears comfortable, no SOB lying flat in bed. FHR: 130s with accelerations to 150, moderate variability, no decelerations. Toco: occasional ctx AFI: 123XX123 cm, cephalic presentation. Bladder and stomach noted  A: IUP at 35.5 weeks with reactive NST and normal AFI. 1) Chronic hypertension - undiagnosed and initially treated as preeclampsia with severe features due to c/o headache and scotomata.  Began IOL, but had made no progress over 3 days. Since BP's had initially responded nicely to labetalol ,  labs were normal,  and headache had initially abated as blood pressures normalized,  Dr. Jeneen Rinks of [facility] weighed in on the plan of care and recommended stopping induction after a negative contraction stress test. The patient has history of neonatal death after a  32 week delivery as well. She had a spike in blood pressures into the severe range last night and headaches with scotomata and an IV dose of apresolinewas required to bring blood pressures down.Blood pressures in the mild range today and headaches continue, but no scotomata today. - her  labetalol was increased to 400mg  po bid yesterday - Will continue to monitor  BPs closely  2) Fetus - fetus was noted to have a prominent slightly enlarged septum pellucidi on 32 week imaging. Will have [facility] re-evalute on Monday 12/14/14 - s/p BMZ -GBS was negative -Growth scan on 1/2 with EFW in 52nd% and normal AFI. AFI today was negative  3) DVT ppx - SCD's in bed  4) Desires BTL if delivered  Mutual plan for monitoring: -Daily  NSTs with BPPs should NST be non-reactive. NST was reactive -2x weekly LFTs, Platelets, P/C ratio (last done 12/13/2015) -24 hour urine weekly (yesterday was 180 mgm) -Appointment with Bridgette Habermann for consultation and management recommendations on Monday Jan 9th at 1:00.- will now be done via phone

## 2015-12-15 ENCOUNTER — Inpatient Hospital Stay: Admit: 2015-12-15 | Payer: Medicaid Other

## 2015-12-15 MED ORDER — HYDROCODONE-ACETAMINOPHEN 5-325 MG PO TABS
1.0000 | ORAL_TABLET | Freq: Once | ORAL | Status: AC
  Administered 2015-12-15: 1 via ORAL

## 2015-12-15 MED ORDER — ONDANSETRON HCL 4 MG/2ML IJ SOLN: 4.0000 mg | Freq: Four times a day (QID) | INTRAMUSCULAR | Status: DC | PRN

## 2015-12-15 MED ORDER — BUTORPHANOL TARTRATE 1 MG/ML IJ SOLN
1.0000 mg | INTRAMUSCULAR | Status: DC | PRN
  Administered 2015-12-16 (×4): 1 mg via INTRAVENOUS
  Filled 2015-12-15 (×4): qty 1

## 2015-12-15 MED ORDER — MAGNESIUM SULFATE BOLUS VIA INFUSION
4.0000 g | Freq: Once | INTRAVENOUS | Status: DC
  Filled 2015-12-15: qty 500

## 2015-12-15 MED ORDER — MISOPROSTOL 200 MCG PO TABS
ORAL_TABLET | ORAL | Status: AC
  Administered 2015-12-15: 25 ug via VAGINAL
  Filled 2015-12-15: qty 4

## 2015-12-15 MED ORDER — OXYTOCIN 10 UNIT/ML IJ SOLN
2.5000 [IU]/h | INTRAVENOUS | Status: DC
  Filled 2015-12-15: qty 4

## 2015-12-15 MED ORDER — OXYTOCIN BOLUS FROM INFUSION: 500.0000 mL | INTRAVENOUS | Status: DC

## 2015-12-15 MED ORDER — MISOPROSTOL 25 MCG QUARTER TABLET
ORAL_TABLET | ORAL | Status: AC
  Administered 2015-12-15: 25 ug via VAGINAL
  Filled 2015-12-15: qty 0.25

## 2015-12-15 MED ORDER — MAGNESIUM SULFATE 4 GM/100ML IV SOLN
INTRAVENOUS | Status: AC
  Administered 2015-12-15: 4 g
  Filled 2015-12-15: qty 100

## 2015-12-15 MED ORDER — LACTATED RINGERS IV SOLN
500.0000 mL | INTRAVENOUS | Status: DC | PRN
  Administered 2015-12-17: 500 mL via INTRAVENOUS

## 2015-12-15 MED ORDER — LABETALOL HCL 5 MG/ML IV SOLN: 20.0000 mg | INTRAVENOUS | Status: DC | PRN

## 2015-12-15 MED ORDER — MISOPROSTOL 25 MCG QUARTER TABLET
25.0000 ug | ORAL_TABLET | ORAL | Status: DC | PRN
  Administered 2015-12-15 – 2015-12-16 (×3): 25 ug via VAGINAL
  Filled 2015-12-15: qty 0.25
  Filled 2015-12-15: qty 1
  Filled 2015-12-15: qty 0.25

## 2015-12-15 MED ORDER — MAGNESIUM SULFATE 50 % IJ SOLN
2.0000 g/h | INTRAMUSCULAR | Status: DC
  Administered 2015-12-15 – 2015-12-17 (×3): 2 g/h via INTRAVENOUS
  Filled 2015-12-15 (×3): qty 80

## 2015-12-15 MED ORDER — TERBUTALINE SULFATE 1 MG/ML IJ SOLN: 0.2500 mg | Freq: Once | INTRAMUSCULAR | Status: DC | PRN

## 2015-12-15 MED ORDER — MAGNESIUM SULFATE 50 % IJ SOLN
2.0000 g/h | INTRAVENOUS | Status: DC
  Filled 2015-12-15: qty 80

## 2015-12-15 MED ORDER — LIDOCAINE HCL (PF) 1 % IJ SOLN
INTRAMUSCULAR | Status: AC
  Filled 2015-12-15: qty 30

## 2015-12-15 MED ORDER — AMMONIA AROMATIC IN INHA
RESPIRATORY_TRACT | Status: AC
  Filled 2015-12-15: qty 10

## 2015-12-15 MED ORDER — MAGNESIUM SULFATE BOLUS VIA INFUSION: 4.0000 g | Freq: Once | INTRAVENOUS | Status: DC

## 2015-12-15 MED ORDER — HYDROXYZINE HCL 50 MG PO TABS
50.0000 mg | ORAL_TABLET | Freq: Four times a day (QID) | ORAL | Status: DC | PRN
  Filled 2015-12-15: qty 1

## 2015-12-15 MED ORDER — ACETAMINOPHEN 325 MG PO TABS: 650.0000 mg | ORAL_TABLET | ORAL | Status: DC | PRN

## 2015-12-15 MED ORDER — LIDOCAINE HCL (PF) 1 % IJ SOLN: 30.0000 mL | INTRAMUSCULAR | Status: DC | PRN

## 2015-12-15 MED ORDER — LACTATED RINGERS IV SOLN
INTRAVENOUS | Status: DC
  Administered 2015-12-15: 100 mL/h via INTRAVENOUS
  Administered 2015-12-16 (×2): via INTRAVENOUS
  Administered 2015-12-17: 100 mL/h via INTRAVENOUS

## 2015-12-15 MED ORDER — OXYTOCIN 10 UNIT/ML IJ SOLN
INTRAMUSCULAR | Status: AC
Start: 2015-12-15 — End: 2015-12-16
  Filled 2015-12-15: qty 2

## 2015-12-15 MED ORDER — HYDROCODONE-ACETAMINOPHEN 5-325 MG PO TABS
ORAL_TABLET | ORAL | Status: AC
  Administered 2015-12-15: 1 via ORAL
  Filled 2015-12-15: qty 1

## 2015-12-15 MED ORDER — HYDRALAZINE HCL 20 MG/ML IJ SOLN: 5.0000 mg | INTRAMUSCULAR | Status: DC | PRN

## 2015-12-15 NOTE — Progress Notes (Signed)
L&D Note  12/15/2015 - 6:51 PM  35 y.o. G2P0100 [redacted]w[redacted]d   Ms. Bertram Gala is undergoing IOL for [facility] with superimposed pre-eclampsia   Subjective:  Headache worsened initially when returned to L&D, lessened now after Norco  Objective:  BP in severe range when initially returning to L&D, currently in mild range Filed Vitals:   12/15/15 1810 12/15/15 1825 12/15/15 1840 12/15/15 1847  BP: 141/95 147/95 143/88 143/93  Pulse: 89 103 94 93  Temp:      TempSrc:      Resp:      Height:      Weight:      SpO2:        Current Vital Signs 24h Vital Sign Ranges  T 98.2 F (36.8 C) Temp  Avg: 98.3 F (36.8 C)  Min: 98.2 F (36.8 C)  Max: 98.5 F (36.9 C)  BP (!) 143/93 mmHg BP  Min: 128/74  Max: 149/99  HR 93 Pulse  Avg: 93.7  Min: 84  Max: 112  RR 18 Resp  Avg: 17.5  Min: 16  Max: 18  SaO2 99 % Not Delivered SpO2  Avg: 99 %  Min: 99 %  Max: 99 %       24 Hour I/O Current Shift I/O  Time Ins Outs 01/08 0701 - 01/09 0700 In: 240 [P.O.:240] Out: -  01/09 0701 - 01/09 1900 In: 340 [P.O.:240; I.V.:100] Out: -     FHR: category 1, baseline 130, mod variability, + accels, occasionally variables Toco: q 5-6 min, not noted by pt SVE: 0.5/thick/-3/vertex   Assessment :  IUP at [redacted]w[redacted]d, IOL for CHTN with superimposed pre-e with severe features    Plan:  Cytotec 25 mcg placed intravaginally Magnesium Sulfate, SCDs and foley placed Ambien prn insomnia  Burlene Arnt, [place]

## 2015-12-15 NOTE — Progress Notes (Signed)
    Expand All Collapse All   Daily Progress Note:  S: Continues to have constant headache not helped by Fioricet, but no scotomata. Baby active. Pt understandably emotional awaiting plan of management.   O: BP 128-146/74-95 in the past 24 hours Pulse 90  Temp(Src) 98.2 F (36.8 C) (Oral)  Resp 18  Ht 5\' 3"  (1.6 m)  Wt 88.451 kg (195 lb)  BMI 34.55 kg/m2  SpO2 100%  LMP 04/06/2015   General : appears comfortable. Lungs CTAB Heart: RRR AFI: 13.66 cm yesterday.   A: IUP at 35.6 weeks, CHTN with superimposed pre-eclampsia with severe features   1) Chronic hypertension - initially treated as preeclampsia with severe features due to c/o headache and scotomata. Began IOL, but had made no progress over 3 days. Since BP's had initially responded nicely to labetalol , labs were normal, and headache had initially abated as blood pressures normalized,  Dr. Jeneen Rinks of [facility] weighed in on the plan of care and recommended stopping induction after a negative contraction stress test. The patient has history of neonatal death after a 32 week delivery as well. She had a spike in blood pressures into the severe range on 1/7 pm that required apresolineto bring blood pressures down.Blood pressures in the mild range since then. Continued c/o headache, no scotomata. Continues on Labetalol 400mg  po bid.   2) Fetus - - s/p BMZ -GBS was negative -Growth scan on 1/2 with EFW in 52nd% and normal AFI yesterday  Dr Zack Seal with [facility] consulted with Dr Glennon Mac via phone today (DP closed d/t inclement weather today.) Recommendation to proceed with IOL again d/t worsening BP over the weekend, continued c/o headache and GA at 36 weeks. POM discussed with pt who is agreement.   IOL per cervix - likely Cytotec vaginally again.  Pt aware of possibility of prolonged IOL and possible need for LTCS if unsuccessful.  -will begin Magnesium Sulfate with IOL for seizure ppx

## 2015-12-16 LAB — COMPREHENSIVE METABOLIC PANEL
ALBUMIN: 2.8 g/dL — AB (ref 3.5–5.0)
ALK PHOS: 89 U/L (ref 38–126)
ALT: 12 U/L — AB (ref 14–54)
AST: 11 U/L — AB (ref 15–41)
Anion gap: 7 (ref 5–15)
BILIRUBIN TOTAL: 0.4 mg/dL (ref 0.3–1.2)
BUN: 12 mg/dL (ref 6–20)
CALCIUM: 7.4 mg/dL — AB (ref 8.9–10.3)
CO2: 21 mmol/L — AB (ref 22–32)
CREATININE: 0.48 mg/dL (ref 0.44–1.00)
Chloride: 107 mmol/L (ref 101–111)
GFR calc Af Amer: >60 mL/min (ref >60)
GFR calc non Af Amer: >60 mL/min (ref >60)
GLUCOSE: 99 mg/dL (ref 65–99)
Potassium: 4.1 mmol/L (ref 3.5–5.1)
Sodium: 135 mmol/L (ref 135–145)
TOTAL PROTEIN: 6.5 g/dL (ref 6.5–8.1)

## 2015-12-16 LAB — CBC
HCT: 37.5 % (ref 35.0–47.0)
Hemoglobin: 12.5 g/dL (ref 12.0–16.0)
MCH: 26.1 pg (ref 26.0–34.0)
MCHC: 33.4 g/dL (ref 32.0–36.0)
MCV: 78.1 fL — ABNORMAL LOW (ref 80.0–100.0)
Platelets: 206 10*3/uL (ref 150–440)
RBC: 4.8 MIL/uL (ref 3.80–5.20)
RDW: 15 % — AB (ref 11.5–14.5)
WBC: 11.1 10*3/uL — ABNORMAL HIGH (ref 3.6–11.0)

## 2015-12-16 MED ORDER — TERBUTALINE SULFATE 1 MG/ML IJ SOLN: 0.2500 mg | Freq: Once | INTRAMUSCULAR | Status: DC | PRN

## 2015-12-16 MED ORDER — OXYTOCIN 40 UNITS IN LACTATED RINGERS INFUSION - SIMPLE MED
1.0000 m[IU]/min | INTRAVENOUS | Status: DC
  Administered 2015-12-16: 1 m[IU]/min via INTRAVENOUS
  Administered 2015-12-17: 300 mL via INTRAVENOUS
  Administered 2015-12-17: 200 mL via INTRAVENOUS

## 2015-12-16 MED ORDER — SODIUM CHLORIDE 0.9 % IJ SOLN
INTRAMUSCULAR | Status: AC
  Filled 2015-12-16: qty 50

## 2015-12-16 NOTE — Progress Notes (Signed)
L&D Progress Note   S: Feeling tired, but happy that she is contracting and we are moving toward delivery  O: BP 102/70 mmHg  Pulse 81  Temp(Src) 98.4 F (36.9 C) (Oral)  Resp 16  Ht 5\' 3"  (1.6 m)  Wt 88.451 kg (195 lb)  BMI 34.55 kg/m2  SpO2 99%  LMP 04/06/2015  General: calm, in NAD, has received Stadol for pain UO: averaging 70 ml/hr FHR 125 with moderate variability, no decelerations Toco: contractions: q3-5 minutes with some coupling. Cytotec dose was held at 8AM due to frequent contractions Cervix: 1+/50-60%/-2 to -3/ soft  A: IUP at 36 weeks with CHTN with superimposed preeclampsia with severe features  P: Continue magnesium for seizure prophylaxis Foley bulb inserted in cervix with 26ml balloon Start Pitocin augmentation Output q1 hr Monitor fetal and maternal well being closely Continue labetalol po  Brenda Todd, CNM

## 2015-12-16 NOTE — Progress Notes (Signed)
L&D Note  12/16/2015 - 8:07 AM  35 y.o. G2P0100 [redacted]w[redacted]d   [name] is admitted for IOL for [facility] with superimposed pre-e   Subjective:  Requesting pain medication  Objective:   Filed Vitals:   12/16/15 0240 12/16/15 0340 12/16/15 0440 12/16/15 0540  BP: 121/77 118/67 122/83 102/70  Pulse: 85 84 82 81  Temp:      TempSrc:      Resp:      Height:      Weight:      SpO2:        Current Vital Signs 24h Vital Sign Ranges  T 98.4 F (36.9 C) Temp  Avg: 98.3 F (36.8 C)  Min: 98.2 F (36.8 C)  Max: 98.4 F (36.9 C)  BP 102/70 mmHg BP  Min: 102/70  Max: 151/88  HR 81 Pulse  Avg: 94.1  Min: 81  Max: 112  RR 16 Resp  Avg: 17  Min: 16  Max: 18  SaO2 99 % Not Delivered No Data Recorded       24 Hour I/O Current Shift I/O  Time Ins Outs 01/09 0701 - 01/10 0700 In: 340 [P.O.:240; I.V.:100] Out: 1725 [Urine:1725]      FHR: category 1, baseline 120 with min-mod variability, + accels, no decels Toco: q 1.5-4 min  SVE: 1.5/50/-2   Assessment :  IUP at 36 wks, IOL    Plan:  Stadol now per pt's request. Hold next dose of cervidil for now. Will defer decision on next step in IOL to oncoming providers  Burlene Arnt, [place]

## 2015-12-16 NOTE — Progress Notes (Signed)
Late Entry: Spoke with Dr. Zack Seal of [facility] who performed a phone consultation with the patient ([facility] closed today due to weather).  After reviewing patient's hospital course, recent need for IV BP medication and increasing blood pressure medication requirement, and especially return of intractable headache (no other neurovascular symptoms), decision made to move to delivery.  Labs have been normal and BPs, at worst, in mild hypertension range (140-159/90-104 for chronic hypertension).   Patient to be induced today.    Prentice Docker, MD 12/16/2015 10:05 AM

## 2015-12-16 NOTE — Progress Notes (Signed)
Initial Nutrition Assessment       INTERVENTION:  Meals and snacks: Cater to pt preferences following delivery   NUTRITION DIAGNOSIS:    (none at this time) related to   as evidenced by  .    GOAL:   Patient will meet greater than or equal to 90% of their needs    MONITOR:    (Energy intake)  REASON FOR ASSESSMENT:   LOS    ASSESSMENT:      Pt with pre-eclampsia, currently at 36 weeks and being induced today.    Past Medical History  Diagnosis Date  . Kidney calculi   . UTI (lower urinary tract infection)   . Chronic hypertension     Current Nutrition: eating 100% of meals per I and O sheet  Food/Nutrition-Related History: per MST no issues with po intake prior to admission   Scheduled Medications:  . labetalol  400 mg Oral BID  . magnesium  4 g Intravenous Once  . sodium chloride        Continuous Medications:  . lactated ringers 100 mL/hr at 12/16/15 1133  . magnesium sulfate 2 g/hr (12/16/15 1136)  . oxytocin    . oxytocin 40 units in LR 1000 mL    . oxytocin 4 milli-units/min (12/16/15 1115)     Electrolyte/Renal Profile and Glucose Profile:   Recent Labs Lab 12/13/15 0636 12/13/15 1951 12/16/15 0656  NA 134* 135 135  K 4.2 3.9 4.1  CL 108 106 107  CO2 20* 19* 21*  BUN 12 17 12   CREATININE 0.43* 0.65 0.48  CALCIUM 8.2* 8.7* 7.4*  GLUCOSE 80 111* 99   Protein Profile:  Recent Labs Lab 12/13/15 0636 12/13/15 1951 12/16/15 0656  ALBUMIN 2.7* 3.1* 2.8*    Gastrointestinal Profile: Last BM: WDL     Weight Change: gain with pregnancy    Diet Order:  Diet clear liquid Room service appropriate?: Yes; Fluid consistency:: Thin  Skin:   reviewed   Height:   Ht Readings from Last 1 Encounters:  12/08/15 5\' 3"  (1.6 m)    Weight:   Wt Readings from Last 1 Encounters:  12/08/15 195 lb (88.451 kg)    Ideal Body Weight:     BMI:  Body mass index is 34.55 kg/(m^2).   EDUCATION NEEDS:   No education needs  identified at this time  LOW Care Level  Verlyn Lambert B. Zenia Resides, [place], [place] (pager) Weekend/On-Call pager [phone])

## 2015-12-17 ENCOUNTER — Inpatient Hospital Stay: Payer: Medicaid Other | Admitting: Anesthesiology

## 2015-12-17 ENCOUNTER — Encounter: Payer: Self-pay | Admitting: *Deleted

## 2015-12-17 ENCOUNTER — Encounter
Admission: EM | Disposition: A | Discharge status: Home / Self Care | Payer: Self-pay | Source: Home / Self Care | Attending: Obstetrics and Gynecology

## 2015-12-17 LAB — COMPREHENSIVE METABOLIC PANEL
ALK PHOS: 91 U/L (ref 38–126)
ALT: 11 U/L — AB (ref 14–54)
AST: 12 U/L — AB (ref 15–41)
Albumin: 2.6 g/dL — ABNORMAL LOW (ref 3.5–5.0)
Anion gap: 8 (ref 5–15)
BILIRUBIN TOTAL: 0.3 mg/dL (ref 0.3–1.2)
BUN: 9 mg/dL (ref 6–20)
CO2: 22 mmol/L (ref 22–32)
CREATININE: 0.5 mg/dL (ref 0.44–1.00)
Calcium: 7.1 mg/dL — ABNORMAL LOW (ref 8.9–10.3)
Chloride: 104 mmol/L (ref 101–111)
GFR calc Af Amer: >60 mL/min (ref >60)
GLUCOSE: 71 mg/dL (ref 65–99)
Potassium: 3.9 mmol/L (ref 3.5–5.1)
Sodium: 134 mmol/L — ABNORMAL LOW (ref 135–145)
TOTAL PROTEIN: 5.7 g/dL — AB (ref 6.5–8.1)

## 2015-12-17 LAB — CBC
HEMATOCRIT: 36.3 % (ref 35.0–47.0)
HEMOGLOBIN: 11.7 g/dL — AB (ref 12.0–16.0)
MCH: 25.3 pg — ABNORMAL LOW (ref 26.0–34.0)
MCHC: 32.3 g/dL (ref 32.0–36.0)
MCV: 78.5 fL — AB (ref 80.0–100.0)
Platelets: 213 10*3/uL (ref 150–440)
RBC: 4.63 MIL/uL (ref 3.80–5.20)
RDW: 14.6 % — ABNORMAL HIGH (ref 11.5–14.5)
WBC: 11.7 10*3/uL — AB (ref 3.6–11.0)

## 2015-12-17 LAB — TYPE AND SCREEN
ABO/RH(D): O POS
Antibody Screen: NEGATIVE

## 2015-12-17 SURGERY — Surgical Case
Anesthesia: Spinal

## 2015-12-17 MED ORDER — ONDANSETRON HCL 4 MG/2ML IJ SOLN: 4.0000 mg | Freq: Three times a day (TID) | INTRAMUSCULAR | Status: DC | PRN

## 2015-12-17 MED ORDER — SODIUM CHLORIDE 0.9 % IJ SOLN: 3.0000 mL | INTRAMUSCULAR | Status: DC | PRN

## 2015-12-17 MED ORDER — NALBUPHINE HCL 10 MG/ML IJ SOLN: 5.0000 mg | INTRAMUSCULAR | Status: DC | PRN

## 2015-12-17 MED ORDER — NALOXONE HCL 0.4 MG/ML IJ SOLN: 0.4000 mg | INTRAMUSCULAR | Status: DC | PRN

## 2015-12-17 MED ORDER — KETOROLAC TROMETHAMINE 30 MG/ML IJ SOLN
30.0000 mg | Freq: Four times a day (QID) | INTRAMUSCULAR | Status: DC | PRN
Start: 2015-12-17 — End: 2015-12-18

## 2015-12-17 MED ORDER — MAGNESIUM SULFATE 40 G IN LACTATED RINGERS - SIMPLE
INTRAVENOUS | Status: DC | PRN
  Administered 2015-12-17: 2 g/h via INTRAVENOUS

## 2015-12-17 MED ORDER — METOCLOPRAMIDE HCL 5 MG/ML IJ SOLN
INTRAMUSCULAR | Status: DC | PRN
  Administered 2015-12-17: 10 mg via INTRAVENOUS

## 2015-12-17 MED ORDER — ACETAMINOPHEN 500 MG PO TABS
1000.0000 mg | ORAL_TABLET | Freq: Four times a day (QID) | ORAL | Status: DC
  Filled 2015-12-17: qty 2

## 2015-12-17 MED ORDER — NALOXONE HCL 2 MG/2ML IJ SOSY
1.0000 ug/kg/h | PREFILLED_SYRINGE | INTRAVENOUS | Status: DC | PRN
  Filled 2015-12-17: qty 2

## 2015-12-17 MED ORDER — FENTANYL CITRATE (PF) 100 MCG/2ML IJ SOLN
25.0000 ug | INTRAMUSCULAR | Status: DC | PRN
  Administered 2015-12-17 – 2015-12-18 (×5): 25 ug via INTRAVENOUS
  Filled 2015-12-17 (×2): qty 2

## 2015-12-17 MED ORDER — GENTAMICIN SULFATE 40 MG/ML IJ SOLN
1.5000 mg/kg | Freq: Once | INTRAMUSCULAR | Status: AC
  Administered 2015-12-17: 130 mg via INTRAVENOUS
  Filled 2015-12-17 (×2): qty 3.25

## 2015-12-17 MED ORDER — ONDANSETRON HCL 4 MG/2ML IJ SOLN: 4.0000 mg | Freq: Once | INTRAMUSCULAR | Status: DC | PRN

## 2015-12-17 MED ORDER — SCOPOLAMINE 1 MG/3DAYS TD PT72
1.0000 | MEDICATED_PATCH | Freq: Once | TRANSDERMAL | Status: DC
  Filled 2015-12-17: qty 1

## 2015-12-17 MED ORDER — LACTATED RINGERS IV SOLN
INTRAVENOUS | Status: DC
  Administered 2015-12-17: via INTRAVENOUS

## 2015-12-17 MED ORDER — DIPHENHYDRAMINE HCL 50 MG/ML IJ SOLN: 12.5000 mg | INTRAMUSCULAR | Status: DC | PRN

## 2015-12-17 MED ORDER — PHENYLEPHRINE HCL 10 MG/ML IJ SOLN
INTRAMUSCULAR | Status: DC | PRN
  Administered 2015-12-17: 100 ug via INTRAVENOUS
  Administered 2015-12-17: 50 ug via INTRAVENOUS

## 2015-12-17 MED ORDER — BUPIVACAINE HCL (PF) 0.5 % IJ SOLN: 10.0000 mL | Freq: Once | INTRAMUSCULAR | Status: DC

## 2015-12-17 MED ORDER — CLINDAMYCIN PHOSPHATE 900 MG/50ML IV SOLN
900.0000 mg | INTRAVENOUS | Status: AC
  Administered 2015-12-17: 900 mg via INTRAVENOUS
  Filled 2015-12-17: qty 50

## 2015-12-17 MED ORDER — OXYTOCIN 40 UNITS IN LACTATED RINGERS INFUSION - SIMPLE MED
INTRAVENOUS | Status: AC
  Filled 2015-12-17: qty 1000

## 2015-12-17 MED ORDER — CHLORHEXIDINE GLUCONATE CLOTH 2 % EX PADS: 2.0000 | MEDICATED_PAD | Freq: Every day | CUTANEOUS | Status: DC

## 2015-12-17 MED ORDER — DIPHENHYDRAMINE HCL 25 MG PO CAPS: 25.0000 mg | ORAL_CAPSULE | ORAL | Status: DC | PRN

## 2015-12-17 MED ORDER — DEXTROSE 5 % IV SOLN: 1.0000 ug/kg/h | INTRAVENOUS | Status: DC | PRN

## 2015-12-17 MED ORDER — BUPIVACAINE IN DEXTROSE 0.75-8.25 % IT SOLN
INTRATHECAL | Status: DC | PRN
  Administered 2015-12-17: 1.8 mL via INTRATHECAL

## 2015-12-17 MED ORDER — MORPHINE SULFATE (PF) 10 MG/ML IV SOLN
INTRAVENOUS | Status: DC | PRN
  Administered 2015-12-17: .2 mg via BUCCAL

## 2015-12-17 MED ORDER — NALBUPHINE HCL 10 MG/ML IJ SOLN: 5.0000 mg | Freq: Once | INTRAMUSCULAR | Status: DC | PRN

## 2015-12-17 MED ORDER — CITRIC ACID-SODIUM CITRATE 334-500 MG/5ML PO SOLN
ORAL | Status: AC
  Administered 2015-12-17: 30 mL via ORAL
  Filled 2015-12-17: qty 15

## 2015-12-17 MED ORDER — MEPERIDINE HCL 25 MG/ML IJ SOLN: 6.2500 mg | INTRAMUSCULAR | Status: DC | PRN

## 2015-12-17 MED ORDER — BUPIVACAINE HCL (PF) 0.5 % IJ SOLN
INTRAMUSCULAR | Status: AC
  Filled 2015-12-17: qty 30

## 2015-12-17 MED ORDER — MAGNESIUM SULFATE 50 % IJ SOLN
2.0000 g/h | INTRAVENOUS | Status: DC
  Administered 2015-12-18: 2 g/h via INTRAVENOUS
  Filled 2015-12-17: qty 80

## 2015-12-17 MED ORDER — DIPHENHYDRAMINE HCL 50 MG/ML IJ SOLN
12.5000 mg | INTRAMUSCULAR | Status: DC | PRN
Start: 2015-12-17 — End: 2015-12-17

## 2015-12-17 MED ORDER — NALOXONE HCL 0.4 MG/ML IJ SOLN
0.4000 mg | INTRAMUSCULAR | Status: DC | PRN
Start: 2015-12-17 — End: 2015-12-17

## 2015-12-17 MED ORDER — KETOROLAC TROMETHAMINE 30 MG/ML IJ SOLN
30.0000 mg | Freq: Four times a day (QID) | INTRAMUSCULAR | Status: DC | PRN
  Administered 2015-12-17 – 2015-12-18 (×3): 30 mg via INTRAVENOUS
  Filled 2015-12-17 (×3): qty 1

## 2015-12-17 MED ORDER — ACETAMINOPHEN 500 MG PO TABS
1000.0000 mg | ORAL_TABLET | Freq: Four times a day (QID) | ORAL | Status: DC
  Administered 2015-12-17: 1000 mg via ORAL

## 2015-12-17 MED ORDER — BUPIVACAINE 0.5 % ON-Q PUMP SINGLE CATH 400 ML: 400.0000 mL | INJECTION | Status: DC

## 2015-12-17 MED ORDER — BUPIVACAINE 0.25 % ON-Q PUMP DUAL CATH 400 ML
INJECTION | Status: AC
Start: 2015-12-17 — End: 2015-12-18
  Filled 2015-12-17: qty 400

## 2015-12-17 MED ORDER — BUPIVACAINE HCL 0.5 % IJ SOLN
INTRAMUSCULAR | Status: DC | PRN
Start: 2015-12-17 — End: 2015-12-17
  Administered 2015-12-17: 10 mL

## 2015-12-17 MED ORDER — IBUPROFEN 600 MG PO TABS: 600.0000 mg | ORAL_TABLET | Freq: Four times a day (QID) | ORAL | Status: DC | PRN

## 2015-12-17 SURGICAL SUPPLY — 36 items
CANISTER SUCT 3000ML (MISCELLANEOUS) ×3 IMPLANT
CATH KIT ON-Q SILVERSOAK 5 (CATHETERS) ×2 IMPLANT
CATH KIT ON-Q SILVERSOAK 5IN (CATHETERS) ×6 IMPLANT
CLOSURE WOUND 1/2 X4 (GAUZE/BANDAGES/DRESSINGS) ×1
DRSG TELFA 3X8 NADH (GAUZE/BANDAGES/DRESSINGS) ×3 IMPLANT
ELECT CAUTERY BLADE 6.4 (BLADE) ×3 IMPLANT
GAUZE SPONGE 4X4 12PLY STRL (GAUZE/BANDAGES/DRESSINGS) ×3 IMPLANT
GLOVE BIOGEL PI IND STRL 6.5 (GLOVE) ×1 IMPLANT
GLOVE BIOGEL PI INDICATOR 6.5 (GLOVE) ×2
GLOVE SURG SYN 6.5 ES PF (GLOVE) ×3 IMPLANT
GLOVE SURG SYN 6.5 PF PI (GLOVE) ×1 IMPLANT
GOWN STRL REUS W/ TWL LRG LVL3 (GOWN DISPOSABLE) ×3 IMPLANT
GOWN STRL REUS W/TWL LRG LVL3 (GOWN DISPOSABLE) ×9
LIQUID BAND (GAUZE/BANDAGES/DRESSINGS) ×3 IMPLANT
NS IRRIG 1000ML POUR BTL (IV SOLUTION) ×3 IMPLANT
PACK C SECTION AR (MISCELLANEOUS) ×3 IMPLANT
PAD DRESSING TELFA 3X8 NADH (GAUZE/BANDAGES/DRESSINGS) ×1 IMPLANT
PAD GROUND ADULT SPLIT (MISCELLANEOUS) ×3 IMPLANT
PAD OB MATERNITY 4.3X12.25 (PERSONAL CARE ITEMS) ×3 IMPLANT
PAD PREP 24X41 OB/GYN DISP (PERSONAL CARE ITEMS) ×3 IMPLANT
STRAP SAFETY BODY (MISCELLANEOUS) ×3 IMPLANT
STRIP CLOSURE SKIN 1/2X4 (GAUZE/BANDAGES/DRESSINGS) ×2 IMPLANT
SUT MNCRL 4-0 (SUTURE) ×3
SUT MNCRL 4-0 27XMFL (SUTURE) ×1
SUT PDS AB 1 TP1 96 (SUTURE) ×1 IMPLANT
SUT PLAIN 2 0 (SUTURE) ×6
SUT PLAIN ABS 2-0 CT1 27XMFL (SUTURE) IMPLANT
SUT VIC AB 0 CT1 36 (SUTURE) ×2 IMPLANT
SUT VIC AB 0 CTX 36 (SUTURE) ×6
SUT VIC AB 0 CTX36XBRD ANBCTRL (SUTURE) IMPLANT
SUT VIC AB 2-0 CT1 27 (SUTURE)
SUT VIC AB 2-0 CT1 TAPERPNT 27 (SUTURE) ×1 IMPLANT
SUT VIC AB 3-0 SH 27 (SUTURE) ×6
SUT VIC AB 3-0 SH 27X BRD (SUTURE) ×1 IMPLANT
SUTURE MNCRL 4-0 27XMF (SUTURE) ×1 IMPLANT
SWABSTK COMLB BENZOIN TINCTURE (MISCELLANEOUS) ×3 IMPLANT

## 2015-12-17 NOTE — Progress Notes (Signed)
RN at the bedside, pt. Remains on LR at 100 ml/hr, Magnesium Sulfate at 2g (25 ml/hr).  Pitocin off since 10:00a.m. Due to fetal tracing with subtle lates..corrected with position change, O2 at 10L via face mask, and LR fluid bolus of 500 ml. CNM notified. Pt. Verbalized resolution to "this situation", wants a C-section if this does not work.  VSS, IV located in left wrist with no signs of infiltrate. FHR 125 bpm with mod variability and accels 10x10, Contractions have stopped without pitocin infusion. UOP 50-200 ml/hr, yellow color.

## 2015-12-17 NOTE — Progress Notes (Signed)
Intrapartum note  DECISION FOR CESAREAN DELIVERY  Kingsley Grohowski has been subject to our efforts to keep her induce her labor and/or keep her pregnant for over a week now.  She was initially admitted on 12/08/15 with concerns for preeclampsia with severe features (headache and scotomata, with elevated BPs) at 35 weeks and was started with cervical ripening with cervidil, with magnesium sulfate infusion for seizure prophylaxis.  She was started on Labetalol for BP control, with great response.  She was further managed with cytotec, a foley blub, and pitocin.  Her blood pressures were stable, it was discovered that she was actually a chronic hypertensive, and her symptoms has resolved.  Her induction was discontinued at that time, and she was kept inpatient for daily NSTs and monitoring. She is s/p recent BMZ, completed on 1/4. She did need increasing doses of Labetalol for BP management, requiring IV dosing at one point.  She continued to have a HA that was frontal, but one-sided, with spots in the vision. These were often related to the elevated BPs and were intermittent until Monday Jan 9th when they were unremitting.  A that time the decision was made to re-attempt induction.  Cytotec, foley bulb and pitocin were again employed, and patient was again started on magnesium sulfate for seizure prophylaxis.  Today, patient is s/p foley bulb and remains 2-3/60/-3 and began to have minimal variability and subtle late decelerations, for which oxygen was applied, a fluid bolus was given, and the pitocin was discontinued.  The patient at this time requested cesarean due to mental and physical exhaustion.  She understands that this is major surgery, and states that she is not making this decision lightly.  She still desires permanent sterilization, and her consent form is signed and confirmed.    I am comfortable with the decision for cesarean due to the unusual and complicated nature of Ms. Ninh's pregnancy in the last  2 weeks.  With BPs requiring IV medication, and carrying the diagnosis of preeclampsia with severe features, and now her 3rd day of induction without significant change, remaining unfavorable cervix, I am supporting this decision and will do her surgery in the next few hours, depending on OR availability.  For now, continue mag, IVF, send platelets, and stop pitocin.  Patient has been fully informed of risks and benefits and agrees to surgery and and requests BTL.  Consent was signed by me.    ----- Larey Days, MD Attending Obstetrician and Gynecologist [facility]

## 2015-12-17 NOTE — Anesthesia Procedure Notes (Addendum)
Spinal Patient location during procedure: OR Start time: 12/17/2015 2:00 PM End time: 12/17/2015 2:04 PM Reason for block: at surgeon's request Staffing Anesthesiologist: Marline Backbone F Performed by: anesthesiologist  Preanesthetic Checklist Completed: patient identified, site marked, surgical consent, pre-op evaluation, timeout performed, IV checked, risks and benefits discussed, monitors and equipment checked and at surgeon's request Spinal Block Patient position: sitting Prep: Betadine Patient monitoring: heart rate and blood pressure Approach: right paramedian Location: L3-4 Injection technique: single-shot Needle Needle gauge: 25 G Needle length: 9 cm Needle insertion depth: 6 cm Assessment Sensory level: T6  Date/Time: 12/17/2015 2:00 PM Performed by: Johnna Acosta Pre-anesthesia Checklist: Patient identified, Emergency Drugs available, Suction available and Patient being monitored Patient Re-evaluated:Patient Re-evaluated prior to inductionOxygen Delivery Method: Nasal cannula

## 2015-12-17 NOTE — Discharge Summary (Signed)
Obstetrical Discharge Summary  Patient Name: Brenda Todd DOB: 05-12-1981 MRN: [id number]  Date of Admission: 12/08/2015 Date of Discharge: 12/19/15 Primary OB: [facility]  Gestational Age at Delivery: [redacted]w[redacted]d  Antepartum complications: preeclampsia with severe features, kidney stones Admitting Diagnosis: preeclampsia with severe features Secondary Diagnosis: Patient Active Problem List   Diagnosis Date Noted  . Postpartum care following cesarean delivery 12/17/2015  . Elevated blood pressure affecting pregnancy in third trimester, antepartum 12/08/2015  . Preeclampsia, severe 12/08/2015  . Nephrolithiasis 10/12/2015  . Supervision of high-risk pregnancy 10/11/2015  . Labor and delivery, indication for care 10/11/2015  . Hypertension   . Cramping affecting pregnancy, antepartum 09/18/2015  . Elevated BP 09/02/2015  . Pyelonephritis affecting pregnancy in second trimester, antepartum 08/08/2015  . Kidney infection 08/06/2015  . Pyelonephritis affecting pregnancy in second trimester 07/13/2015  . History of stillbirth 07/10/2015    Augmentation: Pitocin, Cytotec, Foley Balloon and cervidil Complications: None Intrapartum complications/course: She was initially admitted on 12/08/15 with concerns for preeclampsia with severe features (headache and scotomata, with elevated BPs) at 35 weeks and was started with cervical ripening with cervidil, with magnesium sulfate infusion for seizure prophylaxis. She was started on Labetalol for BP control, with great response. She was further managed with cytotec, a foley blub, and pitocin. Her blood pressures were stable, it was discovered that she was actually a chronic hypertensive, and her symptoms had resolved. Her induction was discontinued at that time, and she was kept inpatient for daily NSTs and monitoring. She is s/p recent BMZ, completed on 1/4. She did need increasing doses of Labetalol for BP management, requiring IV dosing at one  point. She continued to have a HA that was frontal, but one-sided, with spots in the vision. These were often related to the elevated BPs and were intermittent until Monday Jan 9th when they were unremitting. At that time the decision was made to re-attempt induction. Cytotec, foley bulb and pitocin were again employed, and patient was again started on magnesium sulfate for seizure prophylaxis.With insufficient cervical change, she began to have minimal variability and subtle late decelerations, for which oxygen was applied, a fluid bolus was given, and the pitocin was discontinued. The patient at this time requested cesarean due to mental and physical exhaustion. She understands that this is major surgery, and states that she is not making this decision lightly. She still desires permanent sterilization, and her consent form is signed and confirmed.  Date of Delivery: 12/17/15 Delivered By: CVikki PortsWard Delivery Type: primary cesarean section, low transverse incision, double layer closure, and BTL Anesthesia: spinal Placenta: sponatneous Newborn Data: Live born female  Birth Weight: 6 lb 9.5 oz (2990 g) APGAR: 7,9    Discharge Physical Exam:  BP 133/76 mmHg  Pulse 79  Temp(Src) 99.1 F (37.3 C) (Oral)  Resp 18  Ht 5' 3"  (1.6 m)  Wt 88.451 kg (195 lb)  BMI 34.55 kg/m2  SpO2 97%  LMP 04/06/2015  Bottlefeeding  General: NAD CV: RRR Pulm: CTABL, nl effort ABD: s/nd/nt, fundus firm and below the umbilicus Lochia: light Incision: c/d/i, on-q pump in place DVT Evaluation: LE non-ttp, no evidence of DVT on exam.  HEMOGLOBIN  Date Value Ref Range Status  12/18/2015 10.5* 12.0 - 16.0 g/dL Final   HGB  Date Value Ref Range Status  12/11/2014 14.3 12.0-16.0 g/dL Final   HCT  Date Value Ref Range Status  12/18/2015 31.1* 35.0 - 47.0 % Final  12/11/2014 44.2 35.0-47.0 % Final  Post partum course: Postpartum Magnesium Sulfate x 24 hours on L&D followed by 1 day on Mother/Baby  floor. Blood Pressures within normal range since delivery. Headaches have resolved since resumption of solid foods. Bowel Movement x 2 since delivery. Eating, drinking, voiding, up to ambulate without difficulty. Pain well controlled with oral medications and On Q pump. Postpartum Procedures: Magnesium Sulfate per pre eclampsia protocol Disposition: stable, discharge to home. Baby Feeding: formula Baby Disposition: home with mom  Rh Immune globulin given: n/a Rubella vaccine given: no Tdap vaccine given in AP or PP setting: AP Flu vaccine given in AP or PP setting: AP  Contraception:BTL  Prenatal Labs:  Blood type/Rh O positive  Antibody screen negative  Rubella MMR x2  RPR Non reactive  HBsAg negative  HIV negative  GC negative  Chlamydia negative  Genetic screening NA  1 hour GTT 136  3 hour GTT NA  GBS negative          Plan:  Brenda Todd was discharged to home in good condition. Follow-up appointment at [place] with Dr Leonides Schanz in 3 days for a blood pressure check.   Discharge Medications:   Medication List    STOP taking these medications        butalbital-acetaminophen-caffeine 50-325-40 MG tablet  Commonly known as:  FIORICET, ESGIC     diphenhydrAMINE 25 mg capsule  Commonly known as:  BENADRYL     HYDROcodone-acetaminophen 5-325 MG tablet  Commonly known as:  NORCO/VICODIN     nitrofurantoin (macrocrystal-monohydrate) 100 MG capsule  Commonly known as:  MACROBID     promethazine 12.5 MG tablet  Commonly known as:  PHENERGAN     ranitidine 150 MG tablet  Commonly known as:  ZANTAC      TAKE these medications        oxyCODONE 5 MG immediate release tablet  Commonly known as:  Oxy IR/ROXICODONE  Take 1 tablet (5 mg total) by mouth every 4 (four) hours as needed (pain scale 4-7).     prenatal multivitamin Tabs tablet  Take 1 tablet by mouth daily at 12 noon.        Follow-up Information    Follow up with  Ward, Honor Loh, MD In 3 days.   Specialty:  Obstetrics and Gynecology   Why:  Blood pressure check   Contact information:   [place] [address] [phone]       Follow up with Ward, Honor Loh, MD In 2 weeks.   Specialty:  Obstetrics and Gynecology   Why:  2 weeks for incision/well being check   Contact information:   [place] [address] [phone]       Follow up with Ward, Honor Loh, MD In 6 weeks.   Specialty:  Obstetrics and Gynecology   Why:  for routine post partum check   Contact information:   [place] [address] [phone]       Follow up with Ward, Honor Loh, MD. Schedule an appointment as soon as possible for a visit in 1 week.   Specialty:  Obstetrics and Gynecology   Why:  Blood Pressure and Incision Checks   Contact information:   [place] [address] [phone]       Signed:  Charlsie Merles 12/19/15 1030  This patient and plan were discussed with Dr Kenton Kingfisher 12/19/2015

## 2015-12-17 NOTE — Anesthesia Preprocedure Evaluation (Signed)
Anesthesia Evaluation  Patient identified by MRN, date of birth, ID band Patient awake    Reviewed: Allergy & Precautions, H&P , NPO status , Patient's Chart, lab work & pertinent test results  History of Anesthesia Complications Negative for: history of anesthetic complications  Airway Mallampati: II  TM Distance: >3 FB Neck ROM: full    Dental no notable dental hx. (+) Teeth Intact   Pulmonary neg shortness of breath, former smoker,    Pulmonary exam normal breath sounds clear to auscultation       Cardiovascular Exercise Tolerance: Good hypertension, (-) angina(-) Past MI Normal cardiovascular exam Rhythm:regular Rate:Normal     Neuro/Psych  Headaches, negative psych ROS   GI/Hepatic negative GI ROS, Neg liver ROS,   Endo/Other  negative endocrine ROS  Renal/GU Renal disease  negative genitourinary   Musculoskeletal   Abdominal   Peds  Hematology negative hematology ROS (+)   Anesthesia Other Findings Past Medical History:   Kidney calculi                                               UTI (lower urinary tract infection)                          Chronic hypertension                                        Past Surgical History:   APPENDECTOMY                                                    Comment:l/s. ruptured   LITHOTRIPSY                                                  BMI    Body Mass Index   34.55 kg/m 2      Reproductive/Obstetrics (+) Pregnancy                             Anesthesia Physical Anesthesia Plan  ASA: III  Anesthesia Plan: Spinal   Post-op Pain Management:    Induction:   Airway Management Planned:   Additional Equipment:   Intra-op Plan:   Post-operative Plan:   Informed Consent: I have reviewed the patients History and Physical, chart, labs and discussed the procedure including the risks, benefits and alternatives for the proposed  anesthesia with the patient or authorized representative who has indicated his/her understanding and acceptance.   Dental Advisory Given  Plan Discussed with: Anesthesiologist, CRNA and Surgeon  Anesthesia Plan Comments:         Anesthesia Quick Evaluation

## 2015-12-17 NOTE — Progress Notes (Signed)
Subjective:  "I feel an occasional contraction. I'm feeling achy and I do not want to continue with the current plan"  Objective:   Vitals: Blood pressure 114/67, pulse 80, temperature 98.4 F (36.9 C), temperature source Oral, resp. rate 16, height 5\' 3"  (1.6 m), weight 88.451 kg (195 lb), last menstrual period 04/06/2015, SpO2 99 %. General: NAD  Cervical Exam:  Dilation: 2 Effacement (%): 60 Cervical Position: Posterior Station: -3 Presentation: Vertex Exam by:: Rebecca Cairns, CNM Foley still in place per nursing staff Abdomen: Gravid, non tender  FHT: 125, minimal variability, positive accels 10x10, subtle late decels  Toco: q 2-56min  Results for orders placed or performed during the hospital encounter of 12/08/15 (from the past 24 hour(s))  Type and screen [place]     Status: None   Collection Time: 12/17/15  8:08 AM  Result Value Ref Range   ABO/RH(D) O POS    Antibody Screen NEG    Sample Expiration 12/20/2015     Assessment:   35 y.o. G2P0100 [redacted]w[redacted]d with superimposed preeclampsia with severe features by neurologic criteria  Plan:   1) Discontinue pitocin secondary to late decelerations  2) Fetus - cat II tracing with minimal variability and subtle late decelerations  3) Discuss plan of care with attending physician  [place], [place]  12/17/15 note written at 1205 based on pt assessment at 1015  This patient and plan were discussed with Dr Leonides Schanz 12/17/2015

## 2015-12-17 NOTE — Discharge Instructions (Signed)
Discharge instructions:   Call office if you have any of the following: headache, visual changes, fever >101.0 F, chills, breast concerns, excessive vaginal bleeding, incision drainage or problems, leg pain or redness, depression or any other concerns.   Activity: Do not lift > 10 lbs for 6 weeks.  No intercourse or tampons for 6 weeks.  No driving for 1-2 weeks.   Call your doctor for increased pain or vaginal bleeding, temperature above 101.0, depression, or concerns.  No strenuous activity or heavy lifting for 6 weeks.  No intercourse, tampons, douching, or enemas for 6 weeks.  No tub baths-showers only.  No driving for 2 weeks or while taking pain medications.  Continue prenatal vitamin and iron.  Increase calories and fluids while breastfeeding.  You may have a slight fever when your milk comes in, but it should go away on its own.  If it does not, and rises above 101.0 please call the doctor.  For concerns about your baby, please call your pediatrician For breastfeeding concerns, the lactation consultant can be reached at [phone]  You will be discharged on blood pressure medication.  As you progress through your post partum period, your body will typically recover from the pre-eclampsia/high blood pressures.  As this transition occurs you will likely need less and less of the medication.  For this, we will taper down your dose as needed.  If you feel lightheaded or dizzy, take your blood pressure.  If it is <100 or <60, your medication dose is too high and you should call the office for an adjustment.  We will help you with this transition, so please keep [place] informed.  If you experience severe headache, change in vision, right upper quadrant pain, or acute shortness of breath, please call the office immediately.  These are symptoms of worsening preeclampsia, and may require hospital admission.

## 2015-12-17 NOTE — Progress Notes (Signed)
Subjective:  No concerns  Objective:   Vitals: Blood pressure 114/67, pulse 80, temperature 98.4 F (36.9 C), temperature source Oral, resp. rate 16, height 5\' 3"  (1.6 m), weight 88.451 kg (195 lb), last menstrual period 04/06/2015, SpO2 99 %. General: NAD  Cervical Exam:  Dilation: 1.5 Effacement (%): 60 Cervical Position: Posterior Station: -2 Presentation: Vertex Exam by:: T.Brothers, CNM  Foley still in place per nursing staff  FHT: 125, moderate variability, positive accels, no decels Toco: q7-72min  Results for orders placed or performed during the hospital encounter of 12/08/15 (from the past 24 hour(s))  Comprehensive metabolic panel     Status: Abnormal   Collection Time: 12/16/15  6:56 AM  Result Value Ref Range   Sodium 135 135 - 145 mmol/L   Potassium 4.1 3.5 - 5.1 mmol/L   Chloride 107 101 - 111 mmol/L   CO2 21 (L) 22 - 32 mmol/L   Glucose, Bld 99 65 - 99 mg/dL   BUN 12 6 - 20 mg/dL   Creatinine, Ser 0.48 0.44 - 1.00 mg/dL   Calcium 7.4 (L) 8.9 - 10.3 mg/dL   Total Protein 6.5 6.5 - 8.1 g/dL   Albumin 2.8 (L) 3.5 - 5.0 g/dL   AST 11 (L) 15 - 41 U/L   ALT 12 (L) 14 - 54 U/L   Alkaline Phosphatase 89 38 - 126 U/L   Total Bilirubin 0.4 0.3 - 1.2 mg/dL   GFR calc non Af Amer >60 >60 mL/min   GFR calc Af Amer >60 >60 mL/min   Anion gap 7 5 - 15  CBC     Status: Abnormal   Collection Time: 12/16/15  6:56 AM  Result Value Ref Range   WBC 11.1 (H) 3.6 - 11.0 K/uL   RBC 4.80 3.80 - 5.20 MIL/uL   Hemoglobin 12.5 12.0 - 16.0 g/dL   HCT 37.5 35.0 - 47.0 %   MCV 78.1 (L) 80.0 - 100.0 fL   MCH 26.1 26.0 - 34.0 pg   MCHC 33.4 32.0 - 36.0 g/dL   RDW 15.0 (H) 11.5 - 14.5 %   Platelets 206 150 - 440 K/uL    Assessment:   35 y.o. G2P0100 [redacted]w[redacted]d with superimposed preeclampsia with severe features by neurologic criteria  Plan:   1) Labor - not much contractions on 20 of pitocin, stopped and titrating back up  2) Fetus - cat I tracing

## 2015-12-17 NOTE — Transfer of Care (Signed)
Immediate Anesthesia Transfer of Care Note  Patient: Brenda Todd  Procedure(s) Performed: Procedure(s): CESAREAN SECTION (N/A)  Patient Location: PACU  Anesthesia Type:Spinal  Level of Consciousness: awake, alert  and oriented  Airway & Oxygen Therapy: Patient Spontanous Breathing  Post-op Assessment: Report given to RN and Post -op Vital signs reviewed and stable  Post vital signs: Reviewed and stable  Last Vitals:  Filed Vitals:   12/17/15 1313 12/17/15 1544  BP: 135/82 114/72  Pulse: 86 70  Temp:  36.5 C  Resp: 18 14    Complications: No apparent anesthesia complications

## 2015-12-17 NOTE — Progress Notes (Signed)
Pt. To OR for unscheduled c-section - see MD note. Patient also desires BTL, consent in chart.

## 2015-12-17 NOTE — Op Note (Addendum)
Cesarean Section Procedure Note  12/08/2015 - 12/17/2015  Patient:  Brenda Todd  35 y.o. female Preoperative diagnosis:  Preeclampsia with severe features, failed induction of labor, desired permanent sterilization Postoperative diagnosis:  Preeclampsia with severe features, failed induction of labor, desired permanent sterilization  PROCEDURE:  Procedure(s): CESAREAN SECTION (N/A) Surgeon:  Juliann Mule) and Role:    * Honor Loh Ward, MD - Primary Assistant: Rod Can, CNM Anesthesia:  spinal I/O: Total I/O In: 812 [I.V.:812] Out: 1020 [Urine:1020]  Blood loss: 500cc Specimens:  Portion of left tube, portion of right tube.  Cord blood, placenta Findings: Normal appearing uterus and left tube and ovary.  Right tube was adherent to small bowel (filmy) and to ovary for the distal 2/3rds of the tube and fimbriated end.  The right ovary was markedly edematous.   Complications: None Apparent Disposition:  VS stable to PACU  Indication for procedure: Brenda Todd has been subject to our efforts to keep her induce her labor and/or keep her pregnant for over a week now. She was initially admitted on 12/08/15 with concerns for preeclampsia with severe features (headache and scotomata, with elevated BPs) at 35 weeks and was started with cervical ripening with cervidil, with magnesium sulfate infusion for seizure prophylaxis. She was started on Labetalol for BP control, with great response. She was further managed with cytotec, a foley blub, and pitocin. Her blood pressures were stable, it was discovered that she was actually a chronic hypertensive, and her symptoms has resolved. Her induction was discontinued at that time, and she was kept inpatient for daily NSTs and monitoring. She is s/p recent BMZ, completed on 1/4. She did need increasing doses of Labetalol for BP management, requiring IV dosing at one point. She continued to have a HA that was frontal, but one-sided, with spots in the  vision. These were often related to the elevated BPs and were intermittent until Monday Jan 9th when they were unremitting. A that time the decision was made to re-attempt induction. Cytotec, foley bulb and pitocin were again employed, and patient was again started on magnesium sulfate for seizure prophylaxis. Today, patient is s/p foley bulb and remains 2-3/60/-3 and began to have minimal variability and subtle late decelerations, for which oxygen was applied, a fluid bolus was given, and the pitocin was discontinued. The patient at this time requested cesarean due to mental and physical exhaustion. She understands that this is major surgery, and states that she is not making this decision lightly. She still desires permanent sterilization, and her consent form is signed and confirmed.   Procedure Details   The risks, benefits, complications, treatment options, and expected outcomes were discussed with the patient. Informed consent was obtained. The patient was taken to Operating Room, identified as Brenda Todd and the procedure verified as a cesarean delivery with bilateral tubal ligation.   After administration of anesthesia, the patient was prepped and draped in the usual sterile manner, including a vaginal prep. A surgical time out was performed, with the pediatric team present. A Pfannenstiel incision was made and carried down through the subcutaneous tissue to the fascia. Fascial incision was made and extended transversely. The fascia was separated from the underlying rectus tissue superiorly and inferiorly. The peritoneum was identified and entered. Peritoneal incision was extended longitudinally.  A low transverse uterine incision was made. The umbilical cord prolapsed through the hysterotomy and was unable to be returned to allow for delivery of the head without compression of the cord.  Delivered  from cephalic presentation was a Live born female Birth Weight: 6 lb 9.5 oz (2990 g) APGAR:  7,9. Delayed cord clamping was performed for 15 seconds, but due to low activity, this attempt was abandoned.  The umbilical cord was doubly clamped and cut, and the baby was handed off to the awaitng neonatologist.  Cord blood was obtained for evaluation. The placenta was removed intact and appeared normal.  Due to preterm delivery and preeclampsia, it was sent to pathology. The uterus was cleared of clots, membranes, and debris. The uterine incision was closed with running locking sutures of 0 Vicryl, and then a second, imbricating stitch was placed. Hemostasis was observed. The abdominal cavity was evacuated of extraneous fluid.  The attention was turned to the bilateral fallopian tubes.  The bilateral tubes were traced to their fimbriated ends, and the grasped at the middle of the isthmus.  The mesosalpinx was opened, and suture ligation was placed at the proximal and distal end of the tube.  The portion of tube between the suture was divided and handed to nursing as portion of left tube and portion of right tube.  The uterus was returned to the abdominal cavity and again the incision was inspected for hemostasis, which was confirmed.  The paracolic gutters were cleared, and the tubal ligation sutures were observed to be intact.  The peritoneum was closed with 3-0 vicryl.  The on-q trochars were pierced through the skin and fascia. The catheters were then through and tucked beneath the fascia.  The fascia was then reapproximated with running suture of vicryl. After a change of gloves, the subcutaneous tissue was irrigated and reapproximated with 3-0 vicryl. The skin was closed with 4-0 Monocryl and covered with surgical glue.  The on-q pump was primed with 10cc (5 each) of 0.5% bupivacaine and then a sterile dressing was applied.  Instrument, sponge, and needle counts were correct prior the abdominal closure and at the conclusion of the case.   I was present and performed this procedure in its entirety.   The OR staff and I sang happy birthday to Brenda Todd.  ----- Larey Days, MD Attending Obstetrician and Gynecologist [facility]

## 2015-12-18 ENCOUNTER — Encounter: Payer: Self-pay | Admitting: Obstetrics & Gynecology

## 2015-12-18 LAB — CBC
HEMATOCRIT: 31.1 % — AB (ref 35.0–47.0)
HEMOGLOBIN: 10.5 g/dL — AB (ref 12.0–16.0)
MCH: 26.6 pg (ref 26.0–34.0)
MCHC: 33.6 g/dL (ref 32.0–36.0)
MCV: 79.2 fL — ABNORMAL LOW (ref 80.0–100.0)
Platelets: 176 10*3/uL (ref 150–440)
RBC: 3.93 MIL/uL (ref 3.80–5.20)
RDW: 14.8 % — ABNORMAL HIGH (ref 11.5–14.5)
WBC: 9.9 10*3/uL (ref 3.6–11.0)

## 2015-12-18 MED ORDER — BUPIVACAINE ON-Q PAIN PUMP (FOR ORDER SET NO CHG)
INJECTION | Status: DC
  Filled 2015-12-18: qty 1

## 2015-12-18 MED ORDER — LACTATED RINGERS IV SOLN: INTRAVENOUS | Status: DC

## 2015-12-18 MED ORDER — SIMETHICONE 80 MG PO CHEW: 160.0000 mg | CHEWABLE_TABLET | Freq: Four times a day (QID) | ORAL | Status: DC | PRN

## 2015-12-18 MED ORDER — LANOLIN HYDROUS EX OINT: 1.0000 "application " | TOPICAL_OINTMENT | CUTANEOUS | Status: DC | PRN

## 2015-12-18 MED ORDER — DIBUCAINE 1 % RE OINT: 1.0000 "application " | TOPICAL_OINTMENT | RECTAL | Status: DC | PRN

## 2015-12-18 MED ORDER — IBUPROFEN 600 MG PO TABS
600.0000 mg | ORAL_TABLET | Freq: Four times a day (QID) | ORAL | Status: DC | PRN
  Administered 2015-12-18 – 2015-12-19 (×3): 600 mg via ORAL
  Filled 2015-12-18 (×3): qty 1

## 2015-12-18 MED ORDER — SODIUM CHLORIDE 0.9 % IV SOLN: 250.0000 mL | INTRAVENOUS | Status: DC

## 2015-12-18 MED ORDER — TETANUS-DIPHTH-ACELL PERTUSSIS 5-2.5-18.5 LF-MCG/0.5 IM SUSP: 0.5000 mL | Freq: Once | INTRAMUSCULAR | Status: DC

## 2015-12-18 MED ORDER — MENTHOL 3 MG MT LOZG: 1.0000 | LOZENGE | OROMUCOSAL | Status: DC | PRN

## 2015-12-18 MED ORDER — WITCH HAZEL-GLYCERIN EX PADS: 1.0000 "application " | MEDICATED_PAD | CUTANEOUS | Status: DC | PRN

## 2015-12-18 MED ORDER — OXYTOCIN 40 UNITS IN LACTATED RINGERS INFUSION - SIMPLE MED: 2.5000 [IU]/h | INTRAVENOUS | Status: DC

## 2015-12-18 MED ORDER — SODIUM CHLORIDE 0.9 % IJ SOLN
3.0000 mL | INTRAMUSCULAR | Status: DC | PRN
  Administered 2015-12-18: 10 mL via INTRAVENOUS

## 2015-12-18 MED ORDER — SODIUM CHLORIDE 0.9 % IJ SOLN
10.0000 mL | Freq: Three times a day (TID) | INTRAMUSCULAR | Status: DC
  Administered 2015-12-18: 10 mL via INTRAVENOUS

## 2015-12-18 MED ORDER — OXYCODONE HCL 5 MG PO TABS
10.0000 mg | ORAL_TABLET | ORAL | Status: DC | PRN
  Administered 2015-12-18 – 2015-12-19 (×5): 10 mg via ORAL
  Filled 2015-12-18 (×4): qty 2

## 2015-12-18 MED ORDER — PRENATAL MULTIVITAMIN CH
1.0000 | ORAL_TABLET | Freq: Every day | ORAL | Status: DC
  Filled 2015-12-18: qty 1

## 2015-12-18 MED ORDER — SENNOSIDES-DOCUSATE SODIUM 8.6-50 MG PO TABS
2.0000 | ORAL_TABLET | Freq: Every evening | ORAL | Status: DC | PRN
Start: 2015-12-18 — End: 2015-12-19
  Administered 2015-12-18: 2 via ORAL
  Filled 2015-12-18: qty 2

## 2015-12-18 MED ORDER — ACETAMINOPHEN 325 MG PO TABS: 650.0000 mg | ORAL_TABLET | ORAL | Status: DC | PRN

## 2015-12-18 MED ORDER — SODIUM CHLORIDE 0.9 % IJ SOLN: 3.0000 mL | Freq: Two times a day (BID) | INTRAMUSCULAR | Status: DC

## 2015-12-18 MED ORDER — OXYCODONE HCL 5 MG PO TABS
5.0000 mg | ORAL_TABLET | ORAL | Status: DC | PRN
  Filled 2015-12-18 (×2): qty 1

## 2015-12-18 MED ORDER — IBUPROFEN 600 MG PO TABS: 600.0000 mg | ORAL_TABLET | Freq: Four times a day (QID) | ORAL | Status: DC

## 2015-12-18 MED ORDER — PRENATAL MULTIVITAMIN CH: 1.0000 | ORAL_TABLET | Freq: Every day | ORAL | Status: DC

## 2015-12-18 MED ORDER — SIMETHICONE 80 MG PO CHEW
80.0000 mg | CHEWABLE_TABLET | Freq: Two times a day (BID) | ORAL | Status: DC
  Administered 2015-12-18 – 2015-12-19 (×2): 80 mg via ORAL
  Filled 2015-12-18 (×2): qty 1

## 2015-12-18 MED ORDER — DIPHENHYDRAMINE HCL 25 MG PO CAPS: 25.0000 mg | ORAL_CAPSULE | Freq: Four times a day (QID) | ORAL | Status: DC | PRN

## 2015-12-18 MED ORDER — INFLUENZA VAC SPLIT QUAD 0.5 ML IM SUSY: 0.5000 mL | PREFILLED_SYRINGE | INTRAMUSCULAR | Status: DC

## 2015-12-18 NOTE — Anesthesia Postprocedure Evaluation (Signed)
Anesthesia Post Note  Patient: Brenda Todd  Procedure(s) Performed: Procedure(s) (LRB): CESAREAN SECTION (N/A)  Patient location during evaluation: ICU Anesthesia Type: General Level of consciousness: awake, awake and alert and oriented Vital Signs Assessment: post-procedure vital signs reviewed and stable Respiratory status: spontaneous breathing and patient connected to nasal cannula oxygen Cardiovascular status: blood pressure returned to baseline and stable Postop Assessment: no headache and no backache Anesthetic complications: no    Last Vitals:  Filed Vitals:   12/18/15 0937 12/18/15 1008  BP: 136/66 123/73  Pulse: 82 82  Temp: 37 C   Resp: 16 18    Last Pain:  Filed Vitals:   12/18/15 1009  PainSc: 4                  Letcher Schweikert C

## 2015-12-18 NOTE — Progress Notes (Signed)
Subjective:   "My headache is gone." Tolerating regular diet. Holding and feeding baby.  Objective:  Blood pressure 128/80, pulse 80, temperature 98.5 F (36.9 C), temperature source Oral, resp. rate 18, height 5' 3" (1.6 m), weight 88.451 kg (195 lb), last menstrual period 04/06/2015, SpO2 95 %, unknown if currently breastfeeding. Range 128/80-148/85. Continues on Magnesium sulfate  General: NAD, appears comfortable Heart: RRR without murmur Pulmonary: no increased work of breathing/ bases CTA Abdomen: non-distended, non-tender, fundus firm at umbilicus-1FB, BS active Incision: C+D+I, ON Q intact Extremities: SCDs on  Results for orders placed or performed during the hospital encounter of 12/08/15 (from the past 72 hour(s))  Comprehensive metabolic panel     Status: Abnormal   Collection Time: 12/16/15  6:56 AM  Result Value Ref Range   Sodium 135 135 - 145 mmol/L   Potassium 4.1 3.5 - 5.1 mmol/L   Chloride 107 101 - 111 mmol/L   CO2 21 (L) 22 - 32 mmol/L   Glucose, Bld 99 65 - 99 mg/dL   BUN 12 6 - 20 mg/dL   Creatinine, Ser 0.48 0.44 - 1.00 mg/dL   Calcium 7.4 (L) 8.9 - 10.3 mg/dL   Total Protein 6.5 6.5 - 8.1 g/dL   Albumin 2.8 (L) 3.5 - 5.0 g/dL   AST 11 (L) 15 - 41 U/L   ALT 12 (L) 14 - 54 U/L   Alkaline Phosphatase 89 38 - 126 U/L   Total Bilirubin 0.4 0.3 - 1.2 mg/dL   GFR calc non Af Amer >60 >60 mL/min   GFR calc Af Amer >60 >60 mL/min    Comment: (NOTE) The eGFR has been calculated using the CKD EPI equation. This calculation has not been validated in all clinical situations. eGFR's persistently <60 mL/min signify possible Chronic Kidney Disease.    Anion gap 7 5 - 15  CBC     Status: Abnormal   Collection Time: 12/16/15  6:56 AM  Result Value Ref Range   WBC 11.1 (H) 3.6 - 11.0 K/uL   RBC 4.80 3.80 - 5.20 MIL/uL   Hemoglobin 12.5 12.0 - 16.0 g/dL   HCT 37.5 35.0 - 47.0 %   MCV 78.1 (L) 80.0 - 100.0 fL   MCH 26.1 26.0 - 34.0 pg   MCHC 33.4 32.0 - 36.0  g/dL   RDW 15.0 (H) 11.5 - 14.5 %   Platelets 206 150 - 440 K/uL  Type and screen [facility]     Status: None   Collection Time: 12/17/15  8:08 AM  Result Value Ref Range   ABO/RH(D) O POS    Antibody Screen NEG    Sample Expiration 12/20/2015   CBC     Status: Abnormal   Collection Time: 12/17/15  8:13 AM  Result Value Ref Range   WBC 11.7 (H) 3.6 - 11.0 K/uL   RBC 4.63 3.80 - 5.20 MIL/uL   Hemoglobin 11.7 (L) 12.0 - 16.0 g/dL   HCT 36.3 35.0 - 47.0 %   MCV 78.5 (L) 80.0 - 100.0 fL   MCH 25.3 (L) 26.0 - 34.0 pg   MCHC 32.3 32.0 - 36.0 g/dL   RDW 14.6 (H) 11.5 - 14.5 %   Platelets 213 150 - 440 K/uL  Comprehensive metabolic panel     Status: Abnormal   Collection Time: 12/17/15  8:13 AM  Result Value Ref Range   Sodium 134 (L) 135 - 145 mmol/L   Potassium 3.9 3.5 - 5.1 mmol/L  Chloride 104 101 - 111 mmol/L   CO2 22 22 - 32 mmol/L   Glucose, Bld 71 65 - 99 mg/dL   BUN 9 6 - 20 mg/dL   Creatinine, Ser 0.50 0.44 - 1.00 mg/dL   Calcium 7.1 (L) 8.9 - 10.3 mg/dL   Total Protein 5.7 (L) 6.5 - 8.1 g/dL   Albumin 2.6 (L) 3.5 - 5.0 g/dL   AST 12 (L) 15 - 41 U/L   ALT 11 (L) 14 - 54 U/L   Alkaline Phosphatase 91 38 - 126 U/L   Total Bilirubin 0.3 0.3 - 1.2 mg/dL   GFR calc non Af Amer >60 >60 mL/min   GFR calc Af Amer >60 >60 mL/min    Comment: (NOTE) The eGFR has been calculated using the CKD EPI equation. This calculation has not been validated in all clinical situations. eGFR's persistently <60 mL/min signify possible Chronic Kidney Disease.    Anion gap 8 5 - 15  CBC     Status: Abnormal   Collection Time: 12/18/15  7:35 AM  Result Value Ref Range   WBC 9.9 3.6 - 11.0 K/uL   RBC 3.93 3.80 - 5.20 MIL/uL   Hemoglobin 10.5 (L) 12.0 - 16.0 g/dL   HCT 31.1 (L) 35.0 - 47.0 %   MCV 79.2 (L) 80.0 - 100.0 fL   MCH 26.6 26.0 - 34.0 pg   MCHC 33.6 32.0 - 36.0 g/dL   RDW 14.8 (H) 11.5 - 14.5 %   Platelets 176 150 - 440 K/uL     Assessment:   35 y.o.  [id number] postoperativeday # 1-stable  Adequate urine output  Blood pressures normal to mildly elevated  Tolerating regular diet     Plan:  1) Mild anemia   - po ferrous sulfate/ vitamins  2) CHTN with superimposed preeclampsia with severe features  Continue magnesium until 1430 today  DC foley when magnesium discontinued  Ambulate after foley discontinued  Cough, deep breathe  Continue labetalol    3) --/--/O POS (01/11 1962) / [place] / Varicella Immune  4) TDAP UTD  5) Bottle/ BTL  6) Disposition-POD 3 if no further complications  GUTIERREZ, COLLEEN, CNM

## 2015-12-18 NOTE — Anesthesia Postprocedure Evaluation (Signed)
Anesthesia Post Note  Patient: Brenda Todd  Procedure(s) Performed: Procedure(s) (LRB): CESAREAN SECTION (N/A)  Patient location during evaluation: L&D Anesthesia Type: Spinal Level of consciousness: awake and alert, oriented and patient cooperative Pain management: satisfactory to patient Vital Signs Assessment: post-procedure vital signs reviewed and stable Respiratory status: respiratory function stable Cardiovascular status: stable Postop Assessment: no headache, no backache, patient able to bend at knees, no signs of nausea or vomiting and adequate PO intake Anesthetic complications: no    Last Vitals:  Filed Vitals:   12/18/15 0609 12/18/15 0740  BP: 135/81 128/80  Pulse: 80 80  Temp:    Resp: 18     Last Pain:  Filed Vitals:   12/18/15 0755  PainSc: 8                  Maicol Bowland,  Eames Dibiasio A

## 2015-12-18 NOTE — Anesthesia Post-op Follow-up Note (Signed)
  Anesthesia Pain Follow-up Note  Patient: Brenda Todd  Day #: 1  Date of Follow-up: 12/18/2015 Time: 8:53 AM  Last Vitals:  Filed Vitals:   12/18/15 0609 12/18/15 0740  BP: 135/81 128/80  Pulse: 80 80  Temp:    Resp: 18     Level of Consciousness: alert  Pain: none   Side Effects:None  Catheter Site Exam:clean, dry, no drainage  Plan: D/C from anesthesia care  Silvana Newness A

## 2015-12-19 MED ORDER — OXYCODONE HCL 5 MG PO TABS: 5.0000 mg | ORAL_TABLET | ORAL | Status: DC | PRN

## 2015-12-19 NOTE — Progress Notes (Signed)
Pt discharged home with infant.  Discharge instructions and follow up appointment given to and reviewed with pt.  Pt verbalized understanding.  Escorted by auxillary. 

## 2015-12-22 LAB — SURGICAL PATHOLOGY

## 2016-02-06 ENCOUNTER — Emergency Department
Admission: EM | Admit: 2016-02-06 | Discharge: 2016-02-07 | Disposition: A | Discharge status: Home / Self Care | Payer: Medicaid Other | Attending: Emergency Medicine | Admitting: Emergency Medicine

## 2016-02-06 ENCOUNTER — Emergency Department: Payer: Medicaid Other

## 2016-02-06 ENCOUNTER — Encounter: Payer: Self-pay | Admitting: Emergency Medicine

## 2016-02-06 DIAGNOSIS — N83209 Unspecified ovarian cyst, unspecified side: Secondary | ICD-10-CM

## 2016-02-06 DIAGNOSIS — N83202 Unspecified ovarian cyst, left side: Secondary | ICD-10-CM | POA: Insufficient documentation

## 2016-02-06 DIAGNOSIS — I10 Essential (primary) hypertension: Secondary | ICD-10-CM | POA: Insufficient documentation

## 2016-02-06 DIAGNOSIS — Z87891 Personal history of nicotine dependence: Secondary | ICD-10-CM | POA: Insufficient documentation

## 2016-02-06 DIAGNOSIS — N390 Urinary tract infection, site not specified: Secondary | ICD-10-CM | POA: Insufficient documentation

## 2016-02-06 DIAGNOSIS — R109 Unspecified abdominal pain: Secondary | ICD-10-CM

## 2016-02-06 DIAGNOSIS — Z880 Allergy status to penicillin: Secondary | ICD-10-CM | POA: Insufficient documentation

## 2016-02-06 DIAGNOSIS — R1032 Left lower quadrant pain: Secondary | ICD-10-CM | POA: Diagnosis present

## 2016-02-06 DIAGNOSIS — Z79899 Other long term (current) drug therapy: Secondary | ICD-10-CM | POA: Insufficient documentation

## 2016-02-06 LAB — COMPREHENSIVE METABOLIC PANEL
ALK PHOS: 67 U/L (ref 38–126)
ALT: 39 U/L (ref 14–54)
ANION GAP: 8 (ref 5–15)
AST: 27 U/L (ref 15–41)
Albumin: 3.8 g/dL (ref 3.5–5.0)
BUN: 13 mg/dL (ref 6–20)
CALCIUM: 8.7 mg/dL — AB (ref 8.9–10.3)
CHLORIDE: 109 mmol/L (ref 101–111)
CO2: 23 mmol/L (ref 22–32)
Creatinine, Ser: 0.68 mg/dL (ref 0.44–1.00)
GFR calc non Af Amer: >60 mL/min (ref >60)
GLUCOSE: 108 mg/dL — AB (ref 65–99)
Potassium: 3.6 mmol/L (ref 3.5–5.1)
Sodium: 140 mmol/L (ref 135–145)
Total Bilirubin: 0.4 mg/dL (ref 0.3–1.2)
Total Protein: 7.1 g/dL (ref 6.5–8.1)

## 2016-02-06 LAB — URINALYSIS COMPLETE WITH MICROSCOPIC (ARMC ONLY)
BILIRUBIN URINE: NEGATIVE
Glucose, UA: NEGATIVE mg/dL
KETONES UR: NEGATIVE mg/dL
NITRITE: NEGATIVE
PROTEIN: NEGATIVE mg/dL
SPECIFIC GRAVITY, URINE: 1.009 (ref 1.005–1.030)
pH: 6 (ref 5.0–8.0)

## 2016-02-06 LAB — CBC
HEMATOCRIT: 40.1 % (ref 35.0–47.0)
Hemoglobin: 13.4 g/dL (ref 12.0–16.0)
MCH: 26.1 pg (ref 26.0–34.0)
MCHC: 33.3 g/dL (ref 32.0–36.0)
MCV: 78.3 fL — AB (ref 80.0–100.0)
Platelets: 252 10*3/uL (ref 150–440)
RBC: 5.11 MIL/uL (ref 3.80–5.20)
RDW: 15.4 % — AB (ref 11.5–14.5)
WBC: 9.8 10*3/uL (ref 3.6–11.0)

## 2016-02-06 LAB — LIPASE, BLOOD: Lipase: 26 U/L (ref 11–51)

## 2016-02-06 MED ORDER — PROMETHAZINE HCL 25 MG/ML IJ SOLN
12.5000 mg | Freq: Once | INTRAMUSCULAR | Status: AC
  Administered 2016-02-06: 12.5 mg via INTRAVENOUS
  Filled 2016-02-06 (×2): qty 1

## 2016-02-06 MED ORDER — ONDANSETRON HCL 4 MG/2ML IJ SOLN: 4.0000 mg | Freq: Once | INTRAMUSCULAR | Status: DC

## 2016-02-06 MED ORDER — ONDANSETRON HCL 4 MG/2ML IJ SOLN
INTRAMUSCULAR | Status: AC
  Filled 2016-02-06: qty 2

## 2016-02-06 MED ORDER — NITROFURANTOIN MONOHYD MACRO 100 MG PO CAPS: 100.0000 mg | ORAL_CAPSULE | Freq: Two times a day (BID) | ORAL | Status: AC

## 2016-02-06 MED ORDER — PHENAZOPYRIDINE HCL 200 MG PO TABS: 200.0000 mg | ORAL_TABLET | Freq: Three times a day (TID) | ORAL | Status: AC | PRN

## 2016-02-06 MED ORDER — MORPHINE SULFATE (PF) 4 MG/ML IV SOLN
INTRAVENOUS | Status: AC
  Administered 2016-02-06: 4 mg via INTRAVENOUS
  Filled 2016-02-06: qty 1

## 2016-02-06 MED ORDER — SODIUM CHLORIDE 0.9 % IV SOLN
1000.0000 mL | Freq: Once | INTRAVENOUS | Status: AC
  Administered 2016-02-06: 1000 mL via INTRAVENOUS

## 2016-02-06 MED ORDER — MORPHINE SULFATE (PF) 4 MG/ML IV SOLN
4.0000 mg | Freq: Once | INTRAVENOUS | Status: AC
Start: 2016-02-06 — End: 2016-02-06
  Administered 2016-02-06: 4 mg via INTRAVENOUS

## 2016-02-06 MED ORDER — KETOROLAC TROMETHAMINE 30 MG/ML IJ SOLN
30.0000 mg | Freq: Once | INTRAMUSCULAR | Status: AC
  Administered 2016-02-06: 30 mg via INTRAVENOUS
  Filled 2016-02-06: qty 1

## 2016-02-06 NOTE — ED Notes (Signed)
POCT results were NEGATIVE

## 2016-02-06 NOTE — ED Provider Notes (Signed)
[facility] Emergency Department Provider Note  ____________________________________________    I have reviewed the triage vital signs and the nursing notes.   HISTORY  Chief Complaint Flank Pain    HPI Brenda Todd is a 35 y.o. female who presents with complaints of left-sided flank pain that radiates to her left lower abdomen. Patient reports the pain has steadily gotten worse over the last 8-10 days. She reports is severe now and sharp in nature. She reports blood in her urine. She reports she is 7 weeks postpartum via C-section but reports her C-section scar feels fine. She denies fevers or chills. She denies dysuria. She does have history of pyelonephritis and kidney stones in the past   Past Medical History  Diagnosis Date  . Kidney calculi   . UTI (lower urinary tract infection)   . Chronic hypertension     Patient Active Problem List   Diagnosis Date Noted  . Postpartum care following cesarean delivery 12/17/2015  . Elevated blood pressure affecting pregnancy in third trimester, antepartum 12/08/2015  . Preeclampsia, severe 12/08/2015  . Nephrolithiasis 10/12/2015  . Supervision of high-risk pregnancy 10/11/2015  . Labor and delivery, indication for care 10/11/2015  . Hypertension   . Cramping affecting pregnancy, antepartum 09/18/2015  . Elevated BP 09/02/2015  . Pyelonephritis affecting pregnancy in second trimester, antepartum 08/08/2015  . Kidney infection 08/06/2015  . Pyelonephritis affecting pregnancy in second trimester 07/13/2015  . History of stillbirth 07/10/2015    Past Surgical History  Procedure Laterality Date  . Appendectomy      l/s. ruptured  . Lithotripsy    . Cesarean section N/A 12/17/2015    Procedure: CESAREAN SECTION;  Surgeon: Honor Loh Ward, MD;  Location: [facility] ORS;  Service: Obstetrics;  Laterality: N/A;    Current Outpatient Rx  Name  Route  Sig  Dispense  Refill  . oxyCODONE (OXY IR/ROXICODONE) 5 MG  immediate release tablet   Oral   Take 1 tablet (5 mg total) by mouth every 4 (four) hours as needed (pain scale 4-7).   30 tablet   0   . Prenatal Vit-Fe Fumarate-FA (PRENATAL MULTIVITAMIN) TABS tablet   Oral   Take 1 tablet by mouth daily at 12 noon.           Allergies Penicillins  No family history on file.  Social History Social History  Substance Use Topics  . Smoking status: Former Research scientist (life sciences)  . Smokeless tobacco: Never Used  . Alcohol Use: No    Review of Systems  Constitutional: Negative for fever. Eyes: Negative for visual changes. ENT: Negative for sore throat Cardiovascular: Negative for chest pain. Respiratory: Negative for shortness of breath. Gastrointestinal: As above Genitourinary: Negative for dysuria. Musculoskeletal: As above Skin: Negative for rash. Neurological: She denies headaches currently Psychiatric: No anxiety    ____________________________________________   PHYSICAL EXAM:  VITAL SIGNS: ED Triage Vitals  Enc Vitals Group     BP 02/06/16 1929 173/100 mmHg     Pulse Rate 02/06/16 1929 101     Resp 02/06/16 1929 20     Temp 02/06/16 1929 97.5 F (36.4 C)     Temp Source 02/06/16 1929 Oral     SpO2 02/06/16 1929 99 %     Weight 02/06/16 1929 175 lb (79.379 kg)     Height 02/06/16 1929 5\' 3"  (1.6 m)     Head Cir --      Peak Flow --      Pain Score  02/06/16 1930 8     Pain Loc --      Pain Edu? --      Excl. in [place]? --      Constitutional: Alert and oriented. Well appearing and in no distress. Pleasant and interactive Eyes: Conjunctivae are normal.  ENT   Head: Normocephalic and atraumatic.   Mouth/Throat: Mucous membranes are moist. Cardiovascular: Normal rate, regular rhythm.  Respiratory: Normal respiratory effort without tachypnea nor retractions. Breath sounds are clear and equal bilaterally.  Gastrointestinal: Mild tenderness to palpation left lower quadrant. No distention. Mild left CVA tenderness. C-section  scar well-healed Genitourinary: deferred Musculoskeletal: Nontender with normal range of motion in all extremities.  Neurologic:  Normal speech and language. No gross focal neurologic deficits are appreciated. Skin:  Skin is warm, dry and intact. No rash noted. Psychiatric: Mood and affect are normal. Patient exhibits appropriate insight and judgment.  ____________________________________________    LABS (pertinent positives/negatives)  Labs Reviewed  URINALYSIS COMPLETEWITH MICROSCOPIC ([facility] ONLY)  CBC  COMPREHENSIVE METABOLIC PANEL  LIPASE, BLOOD    ____________________________________________   EKG  None  ____________________________________________    RADIOLOGY I have personally reviewed any xrays that were ordered on this patient: CT scan shows no ureterolithiasis but there is a large ovarian cyst on the left  ____________________________________________   PROCEDURES  Procedure(s) performed: none  Critical Care performed: none  ____________________________________________   INITIAL IMPRESSION / ASSESSMENT AND PLAN / ED COURSE  Pertinent labs & imaging results that were available during my care of the patient were reviewed by me and considered in my medical decision making (see chart for details).  Patient presents with left-sided flank pain. She appears to have some CVA tenderness. Differential primarily includes pyelonephritis, kidney stone. We will obtain labs and urine, give IV fluids, Toradol, Zofran. Patient is not breast feeding  ----------------------------------------- 9:40 PM on 02/06/2016 -----------------------------------------  CT shows large ovarian cyst on the left, we will obtain ultrasound to further evaluate. Urinalysis is still pending.  At this point I will ask my colleague to follow up on the [place] results and urinalysis. Suspect patient will be able to be discharged.  ____________________________________________   FINAL CLINICAL  IMPRESSION(S) / ED DIAGNOSES  Final diagnoses:  Flank pain     Lavonia Drafts, MD 02/06/16 2141

## 2016-02-06 NOTE — ED Notes (Signed)
Urine sent to lab at this time, lab called and made aware.

## 2016-02-06 NOTE — ED Notes (Signed)
Patient reports left flank pain, hematuria and headache.  Patient reports being 7 weeks post partum via c-section.

## 2016-02-06 NOTE — ED Notes (Signed)
Pt was able to make a small amount but it was not enough for analysis. Pt was told to inform staff when she was able to urinate.

## 2016-02-06 NOTE — Discharge Instructions (Signed)
Please seek medical attention for any high fevers, chest pain, shortness of breath, change in behavior, persistent vomiting, bloody stool or any other new or concerning symptoms.   Urinary Tract Infection A urinary tract infection (UTI) can occur any place along the urinary tract. The tract includes the kidneys, ureters, bladder, and urethra. A type of germ called bacteria often causes a UTI. UTIs are often helped with antibiotic medicine.  HOME CARE   If given, take antibiotics as told by your doctor. Finish them even if you start to feel better.  Drink enough fluids to keep your pee (urine) clear or pale yellow.  Avoid tea, drinks with caffeine, and bubbly (carbonated) drinks.  Pee often. Avoid holding your pee in for a long time.  Pee before and after having sex (intercourse).  Wipe from front to back after you poop (bowel movement) if you are a woman. Use each tissue only once. GET HELP RIGHT AWAY IF:   You have back pain.  You have lower belly (abdominal) pain.  You have chills.  You feel sick to your stomach (nauseous).  You throw up (vomit).  Your burning or discomfort with peeing does not go away.  You have a fever.  Your symptoms are not better in 3 days. MAKE SURE YOU:   Understand these instructions.  Will watch your condition.  Will get help right away if you are not doing well or get worse.   This information is not intended to replace advice given to you by your health care provider. Make sure you discuss any questions you have with your health care provider.   Document Released: 05/10/2008 Document Revised: 12/13/2014 Document Reviewed: 06/22/2012 Elsevier Interactive Patient Education 2016 Elsevier Inc. Ovarian Cyst An ovarian cyst is a sac filled with fluid or blood. This sac is attached to the ovary. Some cysts go away on their own. Other cysts need treatment.  HOME CARE   Only take medicine as told by your doctor.  Follow up with your doctor as  told.  Get regular pelvic exams and Pap tests. GET HELP IF:  Your periods are late, not regular, or painful.  You stop having periods.  Your belly (abdominal) or pelvic pain does not go away.  Your belly becomes large or puffy (swollen).  You have a hard time peeing (totally emptying your bladder).  You have pressure on your bladder.  You have pain during sex.  You feel fullness, pressure, or discomfort in your belly.  You lose weight for no reason.  You feel sick most of the time.  You have a hard time pooping (constipation).  You do not feel like eating.  You develop pimples (acne).  You have an increase in hair on your body and face.  You are gaining weight for no reason.  You think you are pregnant. GET HELP RIGHT AWAY IF:   Your belly pain gets worse.  You feel sick to your stomach (nauseous), and you throw up (vomit).  You have a fever that comes on fast.  You have belly pain while pooping (bowel movement).  Your periods are heavier than usual. MAKE SURE YOU:   Understand these instructions.  Will watch your condition.  Will get help right away if you are not doing well or get worse.   This information is not intended to replace advice given to you by your health care provider. Make sure you discuss any questions you have with your health care provider.   Document Released:  05/10/2008 Document Revised: 09/12/2013 Document Reviewed: 07/30/2013 Elsevier Interactive Patient Education [employer].

## 2016-02-06 NOTE — ED Notes (Signed)
Patient transported to CT 

## 2016-02-07 MED ORDER — PROMETHAZINE HCL 25 MG PO TABS: 25.0000 mg | ORAL_TABLET | Freq: Four times a day (QID) | ORAL | Status: DC | PRN

## 2016-02-07 MED ORDER — OXYCODONE-ACETAMINOPHEN 5-325 MG PO TABS: 1.0000 | ORAL_TABLET | ORAL | Status: DC | PRN

## 2016-02-07 MED ORDER — MORPHINE SULFATE (PF) 4 MG/ML IV SOLN
4.0000 mg | Freq: Once | INTRAVENOUS | Status: AC
  Administered 2016-02-07: 4 mg via INTRAVENOUS
  Filled 2016-02-07: qty 1

## 2016-02-07 NOTE — ED Notes (Addendum)
Pt returned from US

## 2016-02-07 NOTE — ED Provider Notes (Signed)
-----------------------------------------   12:42 AM on 02/07/2016 -----------------------------------------  Pelvic ultrasound interpreted per Dr. Radene Knee:  1. Somewhat complex 5.2 x 4.9 x 4.4 cm cystic lesion at the left ovary, with increased peripheral echogenicity and a few septations. This is thought to reflect a hemorrhagic cyst, given the acute onset of symptoms, though as noted on CT report, pelvic ultrasound is recommended in 6-10 weeks to confirm resolution. 2. No evidence for ovarian torsion. Uterus unremarkable in appearance.  Pain improved after morphine administration. Updated patient and family member of ultrasound results. She will follow up with [place] next week. Prescriptions for Macrobid and Pyridium written by previous provider. I will add prescriptions for Percocet and Phenergan to take as needed. Strict return precautions given. Both verbalize understanding and agree with plan of care.  Paulette Blanch, MD 02/07/16 [phone]

## 2016-05-03 ENCOUNTER — Emergency Department: Payer: Self-pay

## 2016-05-03 ENCOUNTER — Emergency Department
Admission: EM | Admit: 2016-05-03 | Discharge: 2016-05-03 | Disposition: A | Discharge status: Home / Self Care | Payer: Self-pay | Attending: Emergency Medicine | Admitting: Emergency Medicine

## 2016-05-03 ENCOUNTER — Encounter: Payer: Self-pay | Admitting: Medical Oncology

## 2016-05-03 DIAGNOSIS — F1290 Cannabis use, unspecified, uncomplicated: Secondary | ICD-10-CM | POA: Insufficient documentation

## 2016-05-03 DIAGNOSIS — Z79899 Other long term (current) drug therapy: Secondary | ICD-10-CM | POA: Insufficient documentation

## 2016-05-03 DIAGNOSIS — I10 Essential (primary) hypertension: Secondary | ICD-10-CM | POA: Insufficient documentation

## 2016-05-03 DIAGNOSIS — R1031 Right lower quadrant pain: Secondary | ICD-10-CM | POA: Insufficient documentation

## 2016-05-03 DIAGNOSIS — Z87891 Personal history of nicotine dependence: Secondary | ICD-10-CM | POA: Insufficient documentation

## 2016-05-03 DIAGNOSIS — Z87442 Personal history of urinary calculi: Secondary | ICD-10-CM | POA: Insufficient documentation

## 2016-05-03 DIAGNOSIS — R52 Pain, unspecified: Secondary | ICD-10-CM

## 2016-05-03 DIAGNOSIS — R109 Unspecified abdominal pain: Secondary | ICD-10-CM

## 2016-05-03 LAB — HEPATIC FUNCTION PANEL
ALBUMIN: 4 g/dL (ref 3.5–5.0)
ALT: 22 U/L (ref 14–54)
AST: 22 U/L (ref 15–41)
Alkaline Phosphatase: 56 U/L (ref 38–126)
Bilirubin, Direct: <0.1 mg/dL — ABNORMAL LOW (ref 0.1–0.5)
TOTAL PROTEIN: 7.3 g/dL (ref 6.5–8.1)
Total Bilirubin: 0.4 mg/dL (ref 0.3–1.2)

## 2016-05-03 LAB — CBC
HEMATOCRIT: 41.6 % (ref 35.0–47.0)
Hemoglobin: 13.7 g/dL (ref 12.0–16.0)
MCH: 25.5 pg — AB (ref 26.0–34.0)
MCHC: 33 g/dL (ref 32.0–36.0)
MCV: 77.3 fL — AB (ref 80.0–100.0)
PLATELETS: 251 10*3/uL (ref 150–440)
RBC: 5.38 MIL/uL — ABNORMAL HIGH (ref 3.80–5.20)
RDW: 15 % — AB (ref 11.5–14.5)
WBC: 8.5 10*3/uL (ref 3.6–11.0)

## 2016-05-03 LAB — URINALYSIS COMPLETE WITH MICROSCOPIC (ARMC ONLY)
BACTERIA UA: NONE SEEN
Bilirubin Urine: NEGATIVE
GLUCOSE, UA: NEGATIVE mg/dL
Ketones, ur: NEGATIVE mg/dL
Leukocytes, UA: NEGATIVE
Nitrite: NEGATIVE
PROTEIN: NEGATIVE mg/dL
Specific Gravity, Urine: 1.016 (ref 1.005–1.030)
pH: 5 (ref 5.0–8.0)

## 2016-05-03 LAB — BASIC METABOLIC PANEL
Anion gap: 9 (ref 5–15)
BUN: 15 mg/dL (ref 6–20)
CHLORIDE: 107 mmol/L (ref 101–111)
CO2: 22 mmol/L (ref 22–32)
CREATININE: 0.79 mg/dL (ref 0.44–1.00)
Calcium: 8.8 mg/dL — ABNORMAL LOW (ref 8.9–10.3)
GFR calc Af Amer: >60 mL/min (ref >60)
GFR calc non Af Amer: >60 mL/min (ref >60)
GLUCOSE: 115 mg/dL — AB (ref 65–99)
POTASSIUM: 3.5 mmol/L (ref 3.5–5.1)
SODIUM: 138 mmol/L (ref 135–145)

## 2016-05-03 LAB — PREGNANCY, URINE: PREG TEST UR: NEGATIVE

## 2016-05-03 LAB — LIPASE, BLOOD: LIPASE: 46 U/L (ref 11–51)

## 2016-05-03 MED ORDER — HYDROMORPHONE HCL 1 MG/ML IJ SOLN
0.5000 mg | Freq: Once | INTRAMUSCULAR | Status: AC
  Administered 2016-05-03: 0.5 mg via INTRAVENOUS
  Filled 2016-05-03: qty 1

## 2016-05-03 MED ORDER — PROCHLORPERAZINE EDISYLATE 5 MG/ML IJ SOLN
10.0000 mg | Freq: Once | INTRAMUSCULAR | Status: AC
  Administered 2016-05-03: 10 mg via INTRAVENOUS
  Filled 2016-05-03: qty 2

## 2016-05-03 MED ORDER — ONDANSETRON HCL 4 MG/2ML IJ SOLN: 4.0000 mg | Freq: Once | INTRAMUSCULAR | Status: DC

## 2016-05-03 NOTE — Discharge Instructions (Signed)

## 2016-05-03 NOTE — ED Notes (Signed)
Pt reports rt sided flank pain and radiation of pain to lower abd x 1 week.

## 2016-05-03 NOTE — ED Provider Notes (Signed)
[facility] Emergency Department Provider Note   ____________________________________________  Time seen: Approximately 8:49 AM  I have reviewed the triage vital signs and the nursing notes.   HISTORY  Chief Complaint Flank Pain and Abdominal Pain    HPI Brenda SGARLATA is a 35 y.o. female patient reports right lower abdominal pain for about a week. And then this morning she developed severe right CVA pain, got her out of bed. She has some nausea along with it. Pain is moderately severe crampy in nature like menstrual cramps only much worse. She did have C-section about 5 months ago and has had a little bit of mild right lower quadrant pain ever since then but this is worse. Nothing really seems to make it better or worse. She has no fever. She does not remember if this feels like her kidney stones.  Past Medical History  Diagnosis Date  . Kidney calculi   . UTI (lower urinary tract infection)   . Chronic hypertension     Patient Active Problem List   Diagnosis Date Noted  . Postpartum care following cesarean delivery 12/17/2015  . Elevated blood pressure affecting pregnancy in third trimester, antepartum 12/08/2015  . Preeclampsia, severe 12/08/2015  . Nephrolithiasis 10/12/2015  . Supervision of high-risk pregnancy 10/11/2015  . Labor and delivery, indication for care 10/11/2015  . Hypertension   . Cramping affecting pregnancy, antepartum 09/18/2015  . Elevated BP 09/02/2015  . Pyelonephritis affecting pregnancy in second trimester, antepartum 08/08/2015  . Kidney infection 08/06/2015  . Pyelonephritis affecting pregnancy in second trimester 07/13/2015  . History of stillbirth 07/10/2015    Past Surgical History  Procedure Laterality Date  . Appendectomy      l/s. ruptured  . Lithotripsy    . Cesarean section N/A 12/17/2015    Procedure: CESAREAN SECTION;  Surgeon: Honor Loh Ward, MD;  Location: [facility] ORS;  Service: Obstetrics;  Laterality:  N/A;    Current Outpatient Rx  Name  Route  Sig  Dispense  Refill  . oxyCODONE (OXY IR/ROXICODONE) 5 MG immediate release tablet   Oral   Take 1 tablet (5 mg total) by mouth every 4 (four) hours as needed (pain scale 4-7).   30 tablet   0   . oxyCODONE-acetaminophen (ROXICET) 5-325 MG tablet   Oral   Take 1 tablet by mouth every 4 (four) hours as needed for severe pain.   20 tablet   0   . phenazopyridine (PYRIDIUM) 200 MG tablet   Oral   Take 1 tablet (200 mg total) by mouth 3 (three) times daily as needed for pain.   20 tablet   0   . Prenatal Vit-Fe Fumarate-FA (PRENATAL MULTIVITAMIN) TABS tablet   Oral   Take 1 tablet by mouth daily at 12 noon.         . promethazine (PHENERGAN) 25 MG tablet   Oral   Take 1 tablet (25 mg total) by mouth every 6 (six) hours as needed for nausea or vomiting.   20 tablet   0     Allergies Penicillins  No family history on file.  Social History Social History  Substance Use Topics  . Smoking status: Former Research scientist (life sciences)  . Smokeless tobacco: Never Used  . Alcohol Use: No    Review of Systems Constitutional: No fever/chills Eyes: No visual changes. ENT: No sore throat. Cardiovascular: Denies chest pain. Respiratory: Denies shortness of breath. Gastrointestinal: See history of present illness Genitourinary: Negative for dysuria. Musculoskeletal: Negative  for back pain. Skin: Negative for rash. Neurological: Negative for headaches, focal weakness or numbness.  10-point ROS otherwise negative.  ____________________________________________   PHYSICAL EXAM:  VITAL SIGNS: ED Triage Vitals  Enc Vitals Group     BP 05/03/16 0818 177/114 mmHg     Pulse Rate 05/03/16 0818 87     Resp 05/03/16 0818 18     Temp 05/03/16 0818 97.9 F (36.6 C)     Temp Source 05/03/16 0818 Oral     SpO2 05/03/16 0818 97 %     Weight 05/03/16 0818 163 lb (73.936 kg)     Height 05/03/16 0818 5\' 3"  (1.6 m)     Head Cir --      Peak Flow --       Pain Score 05/03/16 0818 10     Pain Loc --      Pain Edu? --      Excl. in [place]? --     Constitutional: Alert and oriented. Well appearing and in no acute distress. Eyes: Conjunctivae are normal. PERRL. EOMI. Head: Atraumatic. Nose: No congestion/rhinnorhea. Mouth/Throat: Mucous membranes are moist.  Oropharynx non-erythematous. Neck: No stridor.   Cardiovascular: Normal rate, regular rhythm. Grossly normal heart sounds.  Good peripheral circulation. Respiratory: Normal respiratory effort.  No retractions. Lungs CTAB. Gastrointestinal: Soft tender on the right side of the abdomen and the right CVA area No distention. No abdominal bruits. Musculoskeletal: No lower extremity tenderness nor edema.  No joint effusions. Neurologic:  Normal speech and language. No gross focal neurologic deficits are appreciated. No gait instability. Skin:  Skin is warm, dry and intact. No rash noted.    ____________________________________________   LABS (all labs ordered are listed, but only abnormal results are displayed)  Labs Reviewed  BASIC METABOLIC PANEL - Abnormal; Notable for the following:    Glucose, Bld 115 (*)    Calcium 8.8 (*)    All other components within normal limits  CBC - Abnormal; Notable for the following:    RBC 5.38 (*)    MCV 77.3 (*)    MCH 25.5 (*)    RDW 15.0 (*)    All other components within normal limits  URINALYSIS COMPLETEWITH MICROSCOPIC ([facility] ONLY) - Abnormal; Notable for the following:    Color, Urine YELLOW (*)    APPearance CLEAR (*)    Hgb urine dipstick 3+ (*)    Squamous Epithelial / LPF 0-5 (*)    All other components within normal limits  HEPATIC FUNCTION PANEL - Abnormal; Notable for the following:    Bilirubin, Direct <0.1 (*)    All other components within normal limits  LIPASE, BLOOD  PREGNANCY, URINE   ____________________________________________  EKG   ____________________________________________  RADIOLOGY  CT shows a  nonobstructive stone in the left kidney. There is no other stones reported by radiology. ____________________________________________   PROCEDURES    ____________________________________________   INITIAL IMPRESSION / ASSESSMENT AND PLAN / ED COURSE  Pertinent labs & imaging results that were available during my care of the patient were reviewed by me and considered in my medical decision making (see chart for details).  Patient's pain is better and will have her follow-up with urology for the blood in her urine. ____________________________________________   FINAL CLINICAL IMPRESSION(S) / ED DIAGNOSES  Final diagnoses:  Pain  Abdominal pain, unspecified abdominal location      NEW MEDICATIONS STARTED DURING THIS VISIT:  Discharge Medication List as of 05/03/2016 11:40 AM  Note:  This document was prepared using Dragon voice recognition software and may include unintentional dictation errors.    Nena Polio, MD 05/03/16 [phone]

## 2016-05-18 ENCOUNTER — Encounter: Payer: Self-pay | Admitting: Emergency Medicine

## 2016-05-18 ENCOUNTER — Emergency Department: Payer: Self-pay

## 2016-05-18 ENCOUNTER — Emergency Department
Admission: EM | Admit: 2016-05-18 | Discharge: 2016-05-18 | Disposition: A | Discharge status: Home / Self Care | Payer: Self-pay | Attending: Student | Admitting: Student

## 2016-05-18 DIAGNOSIS — W57XXXA Bitten or stung by nonvenomous insect and other nonvenomous arthropods, initial encounter: Secondary | ICD-10-CM | POA: Insufficient documentation

## 2016-05-18 DIAGNOSIS — Z79899 Other long term (current) drug therapy: Secondary | ICD-10-CM | POA: Insufficient documentation

## 2016-05-18 DIAGNOSIS — R030 Elevated blood-pressure reading, without diagnosis of hypertension: Secondary | ICD-10-CM

## 2016-05-18 DIAGNOSIS — I10 Essential (primary) hypertension: Secondary | ICD-10-CM | POA: Insufficient documentation

## 2016-05-18 DIAGNOSIS — R51 Headache: Secondary | ICD-10-CM

## 2016-05-18 DIAGNOSIS — Z87891 Personal history of nicotine dependence: Secondary | ICD-10-CM | POA: Insufficient documentation

## 2016-05-18 DIAGNOSIS — L089 Local infection of the skin and subcutaneous tissue, unspecified: Secondary | ICD-10-CM

## 2016-05-18 DIAGNOSIS — Z87448 Personal history of other diseases of urinary system: Secondary | ICD-10-CM | POA: Insufficient documentation

## 2016-05-18 DIAGNOSIS — Y939 Activity, unspecified: Secondary | ICD-10-CM | POA: Insufficient documentation

## 2016-05-18 DIAGNOSIS — S70361A Insect bite (nonvenomous), right thigh, initial encounter: Secondary | ICD-10-CM | POA: Insufficient documentation

## 2016-05-18 DIAGNOSIS — L03115 Cellulitis of right lower limb: Secondary | ICD-10-CM | POA: Insufficient documentation

## 2016-05-18 DIAGNOSIS — Y9234 Swimming pool (public) as the place of occurrence of the external cause: Secondary | ICD-10-CM | POA: Insufficient documentation

## 2016-05-18 DIAGNOSIS — R519 Headache, unspecified: Secondary | ICD-10-CM

## 2016-05-18 DIAGNOSIS — Y999 Unspecified external cause status: Secondary | ICD-10-CM | POA: Insufficient documentation

## 2016-05-18 LAB — URINALYSIS COMPLETE WITH MICROSCOPIC (ARMC ONLY)
Bilirubin Urine: NEGATIVE
GLUCOSE, UA: NEGATIVE mg/dL
Ketones, ur: NEGATIVE mg/dL
NITRITE: NEGATIVE
PH: 6 (ref 5.0–8.0)
PROTEIN: NEGATIVE mg/dL
SPECIFIC GRAVITY, URINE: 1.015 (ref 1.005–1.030)

## 2016-05-18 LAB — CBC WITH DIFFERENTIAL/PLATELET
Basophils Absolute: 0.1 10*3/uL (ref 0–0.1)
Basophils Relative: 1 %
EOS ABS: 0.2 10*3/uL (ref 0–0.7)
HCT: 42.2 % (ref 35.0–47.0)
Hemoglobin: 13.9 g/dL (ref 12.0–16.0)
LYMPHS ABS: 2.7 10*3/uL (ref 1.0–3.6)
Lymphocytes Relative: 24 %
MCH: 25.5 pg — AB (ref 26.0–34.0)
MCHC: 33 g/dL (ref 32.0–36.0)
MCV: 77.2 fL — ABNORMAL LOW (ref 80.0–100.0)
Monocytes Absolute: 0.6 10*3/uL (ref 0.2–0.9)
Monocytes Relative: 6 %
Neutro Abs: 7.8 10*3/uL — ABNORMAL HIGH (ref 1.4–6.5)
Neutrophils Relative %: 68 %
PLATELETS: 256 10*3/uL (ref 150–440)
RBC: 5.47 MIL/uL — AB (ref 3.80–5.20)
RDW: 15.4 % — ABNORMAL HIGH (ref 11.5–14.5)
WBC: 11.3 10*3/uL — AB (ref 3.6–11.0)

## 2016-05-18 LAB — BASIC METABOLIC PANEL
Anion gap: 9 (ref 5–15)
BUN: 12 mg/dL (ref 6–20)
CO2: 25 mmol/L (ref 22–32)
CREATININE: 0.63 mg/dL (ref 0.44–1.00)
Calcium: 9.1 mg/dL (ref 8.9–10.3)
Chloride: 102 mmol/L (ref 101–111)
GFR calc Af Amer: >60 mL/min (ref >60)
Glucose, Bld: 96 mg/dL (ref 65–99)
POTASSIUM: 4 mmol/L (ref 3.5–5.1)
SODIUM: 136 mmol/L (ref 135–145)

## 2016-05-18 LAB — POCT PREGNANCY, URINE: PREG TEST UR: NEGATIVE

## 2016-05-18 MED ORDER — HYDROCODONE-ACETAMINOPHEN 5-325 MG PO TABS: 1.0000 | ORAL_TABLET | ORAL | Status: DC | PRN

## 2016-05-18 MED ORDER — SULFAMETHOXAZOLE-TRIMETHOPRIM 800-160 MG PO TABS: 1.0000 | ORAL_TABLET | Freq: Two times a day (BID) | ORAL | Status: DC

## 2016-05-18 MED ORDER — CLONIDINE HCL 0.1 MG PO TABS
0.1000 mg | ORAL_TABLET | Freq: Once | ORAL | Status: AC
  Administered 2016-05-18: 0.1 mg via ORAL
  Filled 2016-05-18: qty 1

## 2016-05-18 MED ORDER — HYDROCHLOROTHIAZIDE 25 MG PO TABS
25.0000 mg | ORAL_TABLET | Freq: Every day | ORAL | Status: DC
Start: 2016-05-18 — End: 2020-01-24

## 2016-05-18 MED ORDER — FLUCONAZOLE 150 MG PO TABS: 150.0000 mg | ORAL_TABLET | Freq: Every day | ORAL | Status: DC

## 2016-05-18 MED ORDER — HYDROCODONE-ACETAMINOPHEN 5-325 MG PO TABS
1.0000 | ORAL_TABLET | Freq: Once | ORAL | Status: AC
  Administered 2016-05-18: 1 via ORAL
  Filled 2016-05-18: qty 1

## 2016-05-18 NOTE — ED Provider Notes (Signed)
ED ECG REPORT I, Doran Stabler, the attending physician, personally viewed and interpreted this ECG.   Date: 05/18/2016  EKG Time: 1557  Rate: 77  Rhythm: normal sinus rhythm  Axis: Normal  Intervals:none  ST&T Change: No ST segment elevation or depression. Biphasic T-wave in V2 seen on previous EKG from 09/02/2015.   Orbie Pyo, MD 05/18/16 1600

## 2016-05-18 NOTE — ED Notes (Signed)
Pt here with insect bite to right inner thigh, small amount of redness noted with white in the center. Pt reports she got bit by something yesterday.

## 2016-05-18 NOTE — ED Provider Notes (Signed)
[facility] Emergency Department Provider Note  ____________________________________________  Time seen: Approximately 3:42 PM  I have reviewed the triage vital signs and the nursing notes.   HISTORY  Chief Complaint Insect Bite    HPI Brenda Todd is a 35 y.o. female is here with complaint of pain and redness to her right inner thigh. Patient states that she was at pool yesterday when she was bitten by an insect that was black. Today she noticed some redness along with tenderness and pain. Patient is not taking any over-the-counter medication or put any topical creams in the area. She denies any nausea or vomiting but does state that she has a frontal, temporal headache. On arrival in the emergency room her pressure was elevated. Patient states that she is not having any symptoms or cardiac history. She does state that while pregnant 35 months agoshe had preeclampsia but it was felt at that time that it was because of pregnancy. She states she does have family members that are hypertensive. Currently she rates her pain as 9/10.   Past Medical History  Diagnosis Date  . Kidney calculi   . UTI (lower urinary tract infection)   . Chronic hypertension     Patient Active Problem List   Diagnosis Date Noted  . Postpartum care following cesarean delivery 12/17/2015  . Elevated blood pressure affecting pregnancy in third trimester, antepartum 12/08/2015  . Preeclampsia, severe 12/08/2015  . Nephrolithiasis 10/12/2015  . Supervision of high-risk pregnancy 10/11/2015  . Labor and delivery, indication for care 10/11/2015  . Hypertension   . Cramping affecting pregnancy, antepartum 09/18/2015  . Elevated BP 09/02/2015  . Pyelonephritis affecting pregnancy in second trimester, antepartum 08/08/2015  . Kidney infection 08/06/2015  . Pyelonephritis affecting pregnancy in second trimester 07/13/2015  . History of stillbirth 07/10/2015    Past Surgical History   Procedure Laterality Date  . Appendectomy      l/s. ruptured  . Lithotripsy    . Cesarean section N/A 12/17/2015    Procedure: CESAREAN SECTION;  Surgeon: Honor Loh Ward, MD;  Location: [facility] ORS;  Service: Obstetrics;  Laterality: N/A;    Current Outpatient Rx  Name  Route  Sig  Dispense  Refill  . fluconazole (DIFLUCAN) 150 MG tablet   Oral   Take 1 tablet (150 mg total) by mouth daily.   1 tablet   0   . hydrochlorothiazide (HYDRODIURIL) 25 MG tablet   Oral   Take 1 tablet (25 mg total) by mouth daily.   30 tablet   0   . HYDROcodone-acetaminophen (NORCO/VICODIN) 5-325 MG tablet   Oral   Take 1 tablet by mouth every 4 (four) hours as needed for moderate pain.   20 tablet   0   . phenazopyridine (PYRIDIUM) 200 MG tablet   Oral   Take 1 tablet (200 mg total) by mouth 3 (three) times daily as needed for pain.   20 tablet   0   . Prenatal Vit-Fe Fumarate-FA (PRENATAL MULTIVITAMIN) TABS tablet   Oral   Take 1 tablet by mouth daily at 12 noon.         . promethazine (PHENERGAN) 25 MG tablet   Oral   Take 1 tablet (25 mg total) by mouth every 6 (six) hours as needed for nausea or vomiting.   20 tablet   0   . sulfamethoxazole-trimethoprim (BACTRIM DS,SEPTRA DS) 800-160 MG tablet   Oral   Take 1 tablet by mouth 2 (two)  times daily.   20 tablet   0     Allergies Penicillins  No family history on file.  Social History Social History  Substance Use Topics  . Smoking status: Former Research scientist (life sciences)  . Smokeless tobacco: Never Used  . Alcohol Use: No    Review of Systems Constitutional: No fever/chills Eyes: No visual changes. ENT: No sore throat. Cardiovascular: Denies chest pain. Respiratory: Denies shortness of breath. Gastrointestinal: No abdominal pain.  No nausea, no vomiting.  Genitourinary: Negative for dysuria. Musculoskeletal: Negative for back pain. Skin: Positive for insect bite. Positive for erythema. Neurological: Positive for headaches, negative  focal weakness or numbness.  10-point ROS otherwise negative.  ____________________________________________   PHYSICAL EXAM:  VITAL SIGNS: ED Triage Vitals  Enc Vitals Group     BP 05/18/16 1341 180/115 mmHg     Pulse Rate 05/18/16 1340 93     Resp 05/18/16 1340 16     Temp 05/18/16 1340 99 F (37.2 C)     Temp Source 05/18/16 1340 Oral     SpO2 05/18/16 1340 98 %     Weight 05/18/16 1340 163 lb (73.936 kg)     Height 05/18/16 1340 5\' 3"  (1.6 m)     Head Cir --      Peak Flow --      Pain Score 05/18/16 1340 9     Pain Loc --      Pain Edu? --      Excl. in [place]? --     Constitutional: Alert and oriented. Well appearing and in no acute distress. Eyes: Conjunctivae are normal. PERRL. EOMI. Head: Atraumatic. Nose: No congestion/rhinnorhea. Neck: No stridor.   Cardiovascular: Normal rate, regular rhythm. Grossly normal heart sounds.  Good peripheral circulation. Respiratory: Normal respiratory effort.  No retractions. Lungs CTAB. Gastrointestinal: Soft and nontender. No distention.  Musculoskeletal: Moves upper and lower extremities without any difficulty. Normal gait was noted. Neurologic:  Normal speech and language. No gross focal neurologic deficits are appreciated. No gait instability. Skin:  Skin is warm, dry. Right upper thigh medial aspect there is an erythematous area measuring approximately 4.5-5 cm in length with a pustule in the center. Area is extremely tender to touch and warm. No active drainage is noted at this time. Psychiatric: Mood and affect are normal. Speech and behavior are normal.  ____________________________________________   LABS (all labs ordered are listed, but only abnormal results are displayed)  Labs Reviewed  URINALYSIS COMPLETEWITH MICROSCOPIC ([facility] ONLY) - Abnormal; Notable for the following:    Color, Urine YELLOW (*)    APPearance HAZY (*)    Hgb urine dipstick 3+ (*)    Leukocytes, UA 1+ (*)    Bacteria, UA RARE (*)    Squamous  Epithelial / LPF 0-5 (*)    All other components within normal limits  CBC WITH DIFFERENTIAL/PLATELET - Abnormal; Notable for the following:    WBC 11.3 (*)    RBC 5.47 (*)    MCV 77.2 (*)    MCH 25.5 (*)    RDW 15.4 (*)    Neutro Abs 7.8 (*)    All other components within normal limits  BASIC METABOLIC PANEL  POC URINE PREG, ED  POCT PREGNANCY, URINE   ____________________________________________  EKG  Per Dr. Dineen Kid ____________________________________________  RADIOLOGY  CT scan of the head per radiologist showed no acute intracranial abnormalities. ____________________________________________   PROCEDURES  Procedure(s) performed: None  Critical Care performed: No  ____________________________________________   INITIAL IMPRESSION /  ASSESSMENT AND PLAN / ED COURSE  Pertinent labs & imaging results that were available during my care of the patient were reviewed by me and considered in my medical decision making (see chart for details).  Prior to discharge patient's headache was decreased. Patient's blood pressure was 151/97 at the time of discharge. This was an improvement over her original triage blood pressure of 180/115. Patient still has some pain to her right thigh cellulitis. Patient does not have a PCP follow-up with her elevated blood pressure. She was given list of medical clinics to contact for further evaluation of her blood pressure. She was discharged on hydrochlorothiazide 25 mg one daily. She is also given a prescription for Bactrim DS 1 twice a day for 10 days, Norco as needed for pain, and Diflucan if needed for yeast infection. Patient was given a note to remain out of work for 2 days. She is also instructed to follow-up with [facility] or return to the emergency room if any severe worsening of her insect bite/cellulitis. She was encouraged to use warm compresses frequently to the area and do not attempt to open on her  own. ____________________________________________   FINAL CLINICAL IMPRESSION(S) / ED DIAGNOSES  Final diagnoses:  Cellulitis of right thigh  Insect bite of thigh, infected, right, initial encounter  Elevated blood pressure reading  Acute nonintractable headache, unspecified headache type      NEW MEDICATIONS STARTED DURING THIS VISIT:  Discharge Medication List as of 05/18/2016  6:11 PM    START taking these medications   Details  hydrochlorothiazide (HYDRODIURIL) 25 MG tablet Take 1 tablet (25 mg total) by mouth daily., Starting 05/18/2016, Until Discontinued, Print    HYDROcodone-acetaminophen (NORCO/VICODIN) 5-325 MG tablet Take 1 tablet by mouth every 4 (four) hours as needed for moderate pain., Starting 05/18/2016, Until Discontinued, Print    sulfamethoxazole-trimethoprim (BACTRIM DS,SEPTRA DS) 800-160 MG tablet Take 1 tablet by mouth 2 (two) times daily., Starting 05/18/2016, Until Discontinued, Print         Note:  This document was prepared using Dragon voice recognition software and may include unintentional dictation errors.    Johnn Hai, PA-C 05/18/16 1837  Lavonia Drafts, MD 05/20/16 [id number]

## 2016-05-18 NOTE — Discharge Instructions (Signed)
Cellulitis Cellulitis is an infection of the skin and the tissue under the skin. The infected area is usually red and tender. This happens most often in the arms and lower legs. HOME CARE   Take your antibiotic medicine as told. Finish the medicine even if you start to feel better.  Keep the infected arm or leg raised (elevated).  Put a warm cloth on the area up to 4 times per day.  Only take medicines as told by your doctor.  Keep all doctor visits as told. GET HELP IF:  You see red streaks on the skin coming from the infected area.  Your red area gets bigger or turns a dark color.  Your bone or joint under the infected area is painful after the skin heals.  Your infection comes back in the same area or different area.  You have a puffy (swollen) bump in the infected area.  You have new symptoms.  You have a fever. GET HELP RIGHT AWAY IF:   You feel very sleepy.  You throw up (vomit) or have watery poop (diarrhea).  You feel sick and have muscle aches and pains.   This information is not intended to replace advice given to you by your health care provider. Make sure you discuss any questions you have with your health care provider.   Document Released: 05/10/2008 Document Revised: 08/13/2015 Document Reviewed: 02/07/2012 Elsevier Interactive Patient Education 2016 [place] with [facility] or one of the clinics listed on your papers for recheck of your blood pressure as it was elevated in the emergency room today. Begin taking antibiotics as directed. Also use warm moist compresses to the area frequently.  Take pain medication only as needed and do not drive or operate machinery while taking this medication. Follow-up with [facility] clinic if any continued problems or further concerns about your skin infection.

## 2017-04-14 IMAGING — CT CT RENAL STONE PROTOCOL
1 of 2 series · 3 of 32 positions shown, 7 images · non-contrast
Comparison: 10/11/2015 renal ultrasound. Most recent CT 09/04/2012.

CLINICAL DATA: Left flank pain. Hematuria and headache. Seven weeks
postpartum. Appendectomy. Prior lithotripsy.

EXAM:
CT ABDOMEN AND PELVIS WITHOUT CONTRAST
TECHNIQUE: Multidetector CT imaging of the abdomen and pelvis was performed
following the standard protocol without IV contrast.

[Series 4: lung windows · axial · 0.71mm/px · z∈[-90,-40]mm · 3 of 20 slices shown, 7 images]
[im 5/20  soft-tissue]
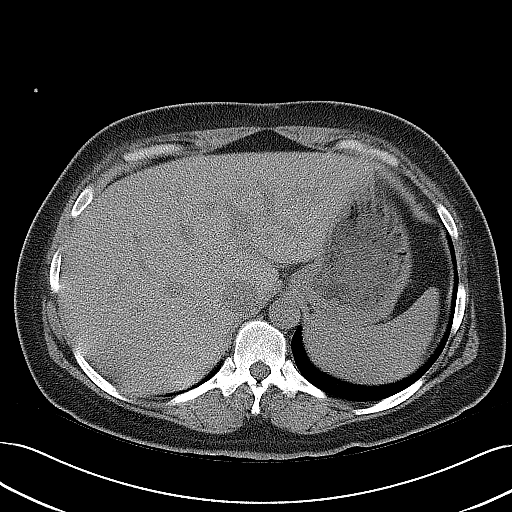
[im 5/20  lung]
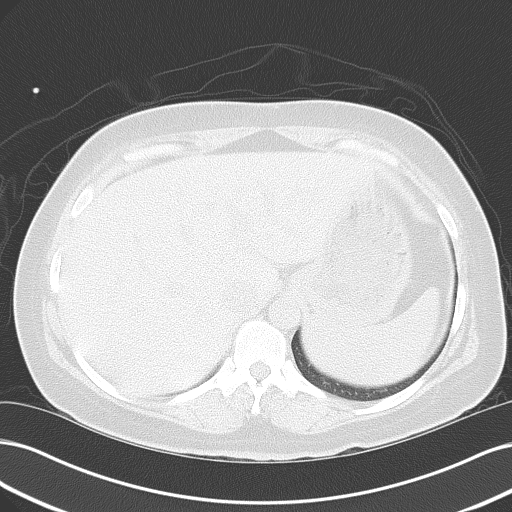
[im 5/20  bone]
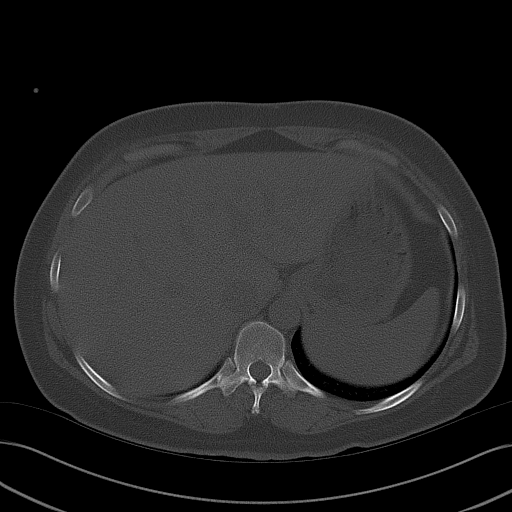
[im 10/20  soft-tissue]
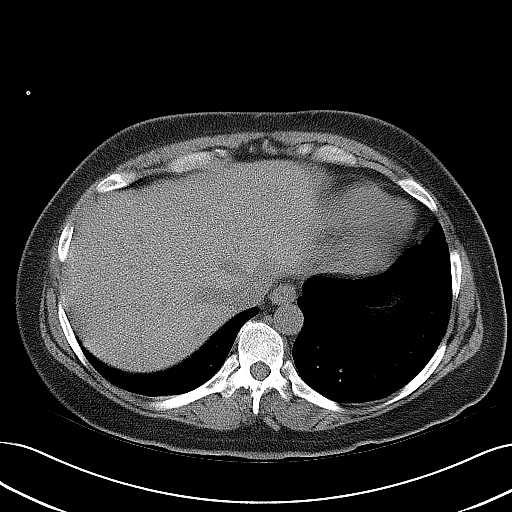
[im 10/20  lung]
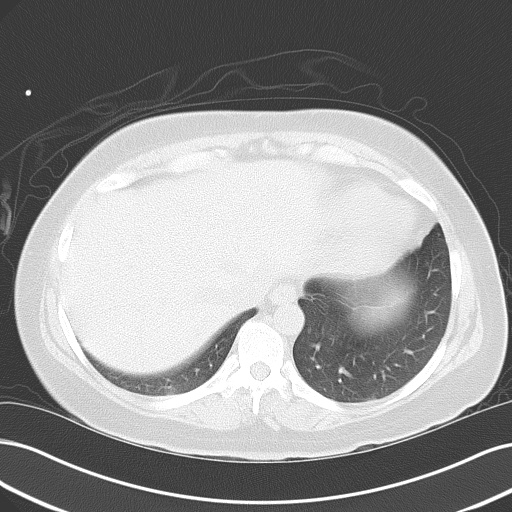
[im 15/20  soft-tissue]
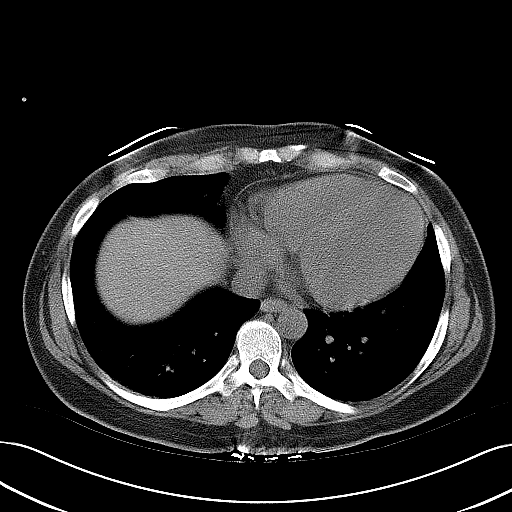
[im 15/20  lung]
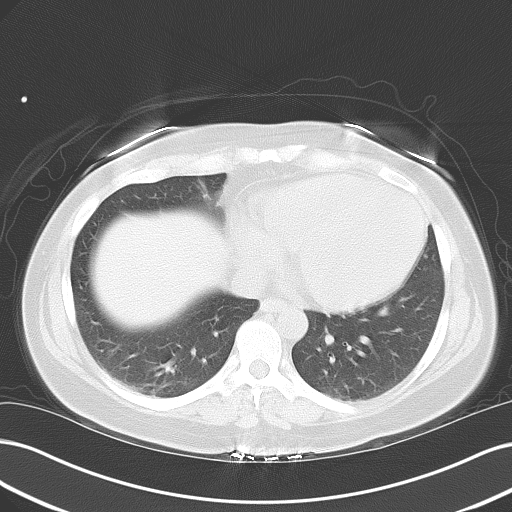

[3 of 32 positions shown; findings below may reference images not displayed]

FINDINGS: Lower chest: Clear lung bases. Borderline cardiomegaly. No
pericardial or pleural effusion.

Hepatobiliary: Mild hepatomegaly at 18.9 cm craniocaudal. Normal
gallbladder, without biliary ductal dilatation.

Pancreas: Normal, without mass or ductal dilatation.

Spleen: Normal in size, without focal abnormality.

Adrenals/Urinary Tract: Normal adrenal glands. 3 mm lower pole left
renal collecting system calculus. No hydronephrosis. No bladder
calculi.

Stomach/Bowel: Normal stomach, without wall thickening. Normal colon
and terminal ileum. Appendectomy. Normal small bowel.

Vascular/Lymphatic: Normal caliber of the aorta and branch vessels.
No abdominopelvic adenopathy.

Reproductive: Normal uterus. A left ovarian mass measures 5.0 x
cm, including on image 67/series 2. This has a fluid level within,
most apparent on sagittal image 90.

Other: No significant free fluid.

Musculoskeletal: Prominent disc bulge at L4-5.
IMPRESSION: 1. Left nephrolithiasis, without obstructive uropathy.
2. Left ovarian mass which is likely a complex cyst, given fluid
level within. Given size of lesion, consider further evaluation with
pelvic ultrasound in 6-10 weeks, in the week following the patient's
menses, to confirm resolution. If ovarian torsion is a clinical
concern, pelvic ultrasound with Doppler should be considered
acutely.
3. Hepatomegaly.

## 2017-10-13 NOTE — Nursing Note (Signed)
Medication Administration Follow Up-Text       Medication Administration Follow Up Entered On:  10/13/2017 15:04 EST    Performed On:  10/13/2017 15:03 EST by Kennieth FrancoisBryers, RN, Bridgette HabermannKerry A      Intervention Information:     ondansetron  Performed by Kennieth FrancoisBryers, RN, Bridgette HabermannKerry A on 10/13/2017 14:10:00 EST       ondansetron,8mg   IV Push,Antecubital, Right       Med Response   ED Medication Response :   No adverse reaction, Symptoms improved   Numeric Rating Pain Scale :   8   Pasero Opioid Induced Sedation Scale :   1 = Awake and alert   Respiratory Rate :   16 br/min   Bryers, RN, Bridgette HabermannKerry A - 10/13/2017 15:03 EST

## 2017-10-13 NOTE — Nursing Note (Signed)
Medication Administration Follow Up-Text       Medication Administration Follow Up Entered On:  10/13/2017 21:14 EST    Performed On:  10/13/2017 21:14 EST by Kennieth FrancoisBryers, RN, Bridgette HabermannKerry A      Intervention Information:     diazepam  Performed by Kennieth FrancoisBryers, RN, Bridgette HabermannKerry A on 10/13/2017 19:41:00 EST       diazepam,5mg   IV Push,Antecubital, Right       Med Response   ED Medication Response :   No adverse reaction, Symptoms improved   Numeric Rating Pain Scale :   10 = Worst possible pain   Pasero Opioid Induced Sedation Scale :   1 = Awake and alert   Respiratory Rate :   16 br/min   Bryers, RN, Bridgette HabermannKerry A - 10/13/2017 21:14 EST

## 2017-10-13 NOTE — Nursing Note (Signed)
Medication Administration Follow Up-Text       Medication Administration Follow Up Entered On:  10/13/2017 15:03 EST    Performed On:  10/13/2017 15:03 EST by Kennieth FrancoisBryers, RN, Bridgette HabermannKerry A      Intervention Information:     morphine  Performed by Kennieth FrancoisBryers, RN, Bridgette HabermannKerry A on 10/13/2017 14:10:00 EST       morphine,4mg   IV Push,Antecubital, Right       Med Response   ED Medication Response :   No adverse reaction, Symptoms improved   Numeric Rating Pain Scale :   8   Pasero Opioid Induced Sedation Scale :   1 = Awake and alert   Respiratory Rate :   16 br/min   Bryers, RN, Bridgette HabermannKerry A - 10/13/2017 15:03 EST

## 2017-10-13 NOTE — Nursing Note (Signed)
Medication Administration Follow Up-Text       Medication Administration Follow Up Entered On:  10/13/2017 21:21 EST    Performed On:  10/13/2017 21:21 EST by Laural BenesJohnson, RN, Jamesetta OrleansEricka      Intervention Information:     ketorolac  Performed by Kennieth FrancoisBryers, RN, Bridgette HabermannKerry A on 10/13/2017 21:03:00 EST       ketorolac,30mg   IV Push,Antecubital, Right       Med Response   ED Medication Response :   No adverse reaction, Symptoms improved   Numeric Rating Pain Scale :   6   Pasero Opioid Induced Sedation Scale :   1 = Awake and alert   Respiratory Rate :   16 br/min   Laural BenesJohnson, RN, Jamesetta OrleansEricka - 10/13/2017 21:21 EST

## 2017-10-13 NOTE — Nursing Note (Signed)
Medication Administration Follow Up-Text       Medication Administration Follow Up Entered On:  10/13/2017 16:51 EST    Performed On:  10/13/2017 16:51 EST by Kennieth FrancoisBryers, RN, Bridgette HabermannKerry A      Intervention Information:     morphine  Performed by Kennieth FrancoisBryers, RN, Bridgette HabermannKerry A on 10/13/2017 16:35:00 EST       morphine,4mg   IV Push,Antecubital, Right       Med Response   ED Medication Response :   No adverse reaction, Symptoms improved   Numeric Rating Pain Scale :   7   Pasero Opioid Induced Sedation Scale :   1 = Awake and alert   Respiratory Rate :   16 br/min   Bryers, RN, Bridgette HabermannKerry A - 10/13/2017 16:51 EST

## 2017-10-15 NOTE — Nursing Note (Signed)
Medication Administration Follow Up-Text       Medication Administration Follow Up Entered On:  10/15/2017 22:42 EST    Performed On:  10/15/2017 22:41 EST by [facility]EL[facility], RN, BRITTANY A      Intervention Information:     morphine  Performed by [facility], BRITTANY A on 10/15/2017 20:02:00 EST       morphine,2mg   IV Push,Antecubital, Left,severe pain (8-10)       Med Response   ED Medication Response :   No adverse reaction, Symptoms improved   Numeric Rating Pain Scale :   2   Pasero Opioid Induced Sedation Scale :   S = Sleep, easy to arouse   Respiratory Rate :   16 br/min   WELCH, RN, BRITTANY A - 10/15/2017 22:41 EST

## 2017-10-15 NOTE — Nursing Note (Signed)
Medication Administration Follow Up-Text       Medication Administration Follow Up Entered On:  10/15/2017 17:16 EST    Performed On:  10/15/2017 17:16 EST by Peterson AoALBERS, RN, Talmage NapALISON H      Intervention Information:     acetaminophen-hydrocodone  Performed by Cristal DeerHRISTOPHER, RN, LORI D on 10/15/2017 14:30:00 EST       HYDROcodone-acetaminophen,2tabs  Oral,moderate pain (4-7)       Med Response   ED Medication Response :   Symptoms improved   ALBERS, RN, ALISON H - 10/15/2017 17:16 EST

## 2017-10-15 NOTE — Nursing Note (Signed)
Adult Patient History Form-Text       Adult Patient History Entered On:  10/15/2017 14:21 EST    Performed On:  10/15/2017 14:16 EST by Peterson Ao, RN, Talmage Nap               General Info   Patient Identified :   Gwendolyn Chen   Information Given By :   Self   Accompanied By :   Family member   In Charge of News (ICON) Name :   Clarisa Kindred [phone]   Pregnancy Status :   Patient denies   In Clinical Trial With Signed Consent for Related Condition :   N/A   Peterson Ao RN, Talmage Nap - 10/15/2017 14:16 EST   Allergies   (As Of: 10/15/2017 14:21:16 EST)   Allergies (Active)   cephalosporins  Estimated Onset Date:   Unspecified ; Reactions:   Anaphylactic reaction ; Created By:   Kennieth Francois, RN, Bridgette Habermann; Reaction Status:   Active ; Category:   Drug ; Substance:   cephalosporins ; Type:   Allergy ; Severity:   Severe ; Updated By:   Kennieth Francois, RN, Bridgette Habermann; Reviewed Date:   10/15/2017 12:06 EST      penicillin  Estimated Onset Date:   Unspecified ; Reactions:   hives ; Created By:   Aldona Lento RN, Vinetta Bergamo; Reaction Status:   Active ; Category:   Drug ; Substance:   penicillin ; Type:   Allergy ; Severity:   Moderate ; Updated By:   Aldona Lento RN, Vinetta Bergamo; Reviewed Date:   10/15/2017 12:06 EST        Medication History   Medication List   (As Of: 10/15/2017 14:21:16 EST)   Normal Order    Sodium Chloride 0.9% intravenous solution 1,000 mL  :   Sodium Chloride 0.9% intravenous solution 1,000 mL ; Status:   Ordered ; Ordered As Mnemonic:   Sodium Chloride 0.9% 1,000 mL ; Simple Display Line:   100 mL/hr, IV ; Ordering Provider:   Romana Juniper; Catalog Code:   Sodium Chloride 0.9% ; Order Dt/Tm:   10/15/2017 13:34:45          nitroglycerin 50 mg + Premix D5W 250 mL  :   nitroglycerin 50 mg + Premix D5W 250 mL ; Status:   Ordered ; Ordered As Mnemonic:   nitroglycerin IV additive 50 mg + Premix D5W 250 mL ; Simple Display Line:   TITRATE, IV ; Ordering Provider:   Yates Decamp II; Catalog Code:   Premix D5W ; Order Dt/Tm:    10/15/2017 11:55:57 ; Comment:   Titrate by 22mcg/min q3-5 min to maintain chest pain relief; maxiumum dose 200 mcg/min; Notify MD if chest pain unrelieved or SBP <          atropine 0.1 mg/mL Inj Soln 5 mL  :   atropine 0.1 mg/mL Inj Soln 5 mL ; Status:   Ordered ; Ordered As Mnemonic:   atropine ; Simple Display Line:   0.5 mg, 5 mL, IV Push, Once, PRN: other (see comment) ; Ordering Provider:   Romana Juniper; Catalog Code:   atropine ; Order Dt/Tm:   10/15/2017 13:34:45 ; Comment:   HR less than 40 and/or SBP less than 80          Sodium Chloride 0.9% intravenous solution Bolus  :   Sodium Chloride 0.9% intravenous solution Bolus ; Status:   Ordered ; Ordered As Mnemonic:   Sodium Chloride 0.9%  bolus ; Simple Display Line:   250 mL, 500 mL/hr, IV Bolus, Once, PRN: spasm ; Ordering Provider:   Romana Juniper; Catalog Code:   Sodium Chloride 0.9% ; Order Dt/Tm:   10/15/2017 13:34:44          morphine 4 mg/mL preservative-free Sol  :   morphine 4 mg/mL preservative-free Sol ; Status:   Completed ; Ordered As Mnemonic:   morphine ; Simple Display Line:   4 mg, 1 mL, IV Push, Once ; Ordering Provider:   Yates Decamp II; Catalog Code:   morphine ; Order Dt/Tm:   10/15/2017 11:56:24          ondansetron 2 mg/mL Inj Soln 2 mL  :   ondansetron 2 mg/mL Inj Soln 2 mL ; Status:   Completed ; Ordered As Mnemonic:   Zofran ; Simple Display Line:   4 mg, 2 mL, IV Push, Once ; Ordering Provider:   Yates Decamp II; Catalog Code:   ondansetron ; Order Dt/Tm:   10/15/2017 11:56:24          aspirin 81 mg Chew Tab  :   aspirin 81 mg Chew Tab ; Status:   Completed ; Ordered As Mnemonic:   aspirin ; Simple Display Line:   324 mg, 4 tabs, Chewed, Once ; Ordering Provider:   Yates Decamp II; Catalog Code:   aspirin ; Order Dt/Tm:   10/15/2017 11:55:58          heparin 5000 units/mL Inj Soln 1 mL  :   heparin 5000 units/mL Inj Soln 1 mL ; Status:   Completed ; Ordered As Mnemonic:   heparin  (max. cap 4,000 units) ; Simple Display Line:   4,000 units, 0.8 mL, IV Push, Once ; Ordering Provider:   Yates Decamp II; Catalog Code:   heparin ; Order Dt/Tm:   10/15/2017 11:55:58 ; Comment:   Order should automatically round to the appropriate max dose based on a rule:                                          Initial BOLUS dose / maxium dose (units)                                                                                                         < 42kg =  2,000 units  42-58kg = 3,000 units                                                                                                                                         > 58kg = 4,000 units  Target Dose: heparin (max. cap 4,000 units) 60 unit/kg  10/15/2017 11:56:00*Standardized*          iopamidol 76% Inj Soln 100 mL  :   iopamidol 76% Inj Soln 100 mL ; Status:   Completed ; Ordered As Mnemonic:   iopamidol 76% injectable solution ; Simple Display Line:   500 mL, Intra-arterial, On Call ; Ordering Provider:   Yates DecampBARNES-MD, II, FRANK II; Catalog Code:   iopamidol ; Order Dt/Tm:   10/15/2017 11:56:00          gadobutrol  :   gadobutrol ; Status:   Ordered ; Ordered As Mnemonic:   Gadavist ; Simple Display Line:   7.4 mL, IV Push, Once ; Ordering Provider:   Carmelina PaddockHORNE-DO,  WESTIN G; Catalog Code:   gadobutrol ; Order Dt/Tm:   10/13/2017 18:18:49 ; Comment:   Target Dose: Gadavist 0.1 mL/kg  10/13/2017 18:18:50            Prescription/Discharge Order    levofloxacin  :   levofloxacin ; Status:   Completed ; Ordered As Mnemonic:   Levaquin 500 mg oral tablet ; Simple Display Line:   500 mg, 1 tabs, Oral, q24hr, for 5 days, 5 tabs, 0 Refill(s) ; Ordering Provider:   Carmelina PaddockHORNE-DO,  WESTIN G; Catalog Code:   levofloxacin ; Order Dt/Tm:   10/13/2017 22:32:04          naproxen  :   naproxen ; Status:   Completed ; Ordered As Mnemonic:    Anaprox-DS 550 mg oral tablet ; Simple Display Line:   550 mg, 1 tabs, Oral, BID, for 7 days, PRN: for pain, 14 tabs, 0 Refill(s) ; Ordering Provider:   Carmelina PaddockHORNE-DO,  WESTIN G; Catalog Code:   naproxen ; Order Dt/Tm:   10/13/2017 22:32:12          butalbital/acetaminophen/caffeine  :   butalbital/acetaminophen/caffeine ; Status:   Completed ; Ordered As Mnemonic:   Fioricet oral tablet ; Simple Display Line:   1-2 tabs, Oral, q4hr, for 3 days, PRN: as needed for headache, 12 tabs, 0 Refill(s) ; Ordering Provider:   Carmelina PaddockHORNE-DO,  WESTIN G; Catalog Code:   butalbital/acetaminophen/caffeine ; Order Dt/Tm:   10/13/2017 22:31:50            Problem History   (As Of: 10/15/2017 14:21:16 EST)   Problems(Active)    Kidney stone (SNOMED CT  :[id number] )  Name of Problem:   Kidney stone ; Recorder:   DEACON, RN, Vinetta BergamoBARBARA M; Confirmation:   Confirmed ; Classification:   Patient Stated ; Code:   [id number] ; Contributor System:   DietitianowerChart ; Last Updated:  10/13/2017 13:18 EST ; Life Cycle Date:   10/13/2017 ; Life Cycle Status:   Active ; Vocabulary:   SNOMED CT          Diagnoses(Active)    Chest pain  Date:   10/15/2017 ; Diagnosis Type:   Reason For Visit ; Confirmation:   Complaint of ; Clinical Dx:   Chest pain ; Classification:   Medical ; Clinical Service:   Emergency medicine ; Code:   PNED ; Probability:   0 ; Diagnosis Code:   [id number]      Inferior MI  Date:   10/15/2017 ; Diagnosis Type:   Discharge ; Confirmation:   Confirmed ; Clinical Dx:   Inferior MI ; Classification:   Medical ; Clinical Service:   Non-Specified ; Code:   ICD-10-CM ; Probability:   0 ; Diagnosis Code:   I21.19        Procedure History        -    Procedure History   (As Of: 10/15/2017 14:21:16 EST)     Immunizations   Influenza Vaccine Status :   Not received   Last Tetanus :   Less than 5 years   ALBERS, RN, Talmage NapLISON H - 10/15/2017 14:16 EST   ID Risk Screen   Chills :   No   Cough (Any Duration) :   No   Fever :   No    Hemoptysis (Blood in Sputum) :   No   Night Sweats :   No   ALBERS, RN, ALISON H - 10/15/2017 14:16 EST   3 or more loose/watery stools :   No   MRSA/VRE Screening :   None of these apply   Patient Recent Travel History :   No recent travel   Family Member Travel History :   No recent travel   BrockwayALBERS, Kallie EdwardRN, ALISON H - 10/15/2017 14:16 EST   Bloodless Medicine   Will Patient Accept Blood Transfusion and/or Blood Products :   Yes   Peterson AoLBERS, RN, ALISON H - 10/15/2017 14:16 EST   Nutrition   Nutritional Risk Factors :   None   ALBERS, RN, ALISON H - 10/15/2017 14:16 EST   Functional   Sensory Deficits :   None   ALBERS, RN, ALISON H - 10/15/2017 14:16 EST   Social History   Social History   (As Of: 10/15/2017 14:21:16 EST)   Tobacco:        Current every day smoker, 10 per day.   (Last Updated: 10/15/2017 11:55:32 EST by Ladona RidgelAYLOR, RN, Richarda OsmondJENNIFER S)            Spiritual   Faith/Denomination :   Carlyle Dollyhristian   ALBERS, RN, Talmage NapALISON H - 10/15/2017 14:16 EST   Harm Screen   Recent Life Events :   None   Previous Mental Illness Diagnosis :   None   Suicidal Behavior :   None   Suicidal Ideation :   None   Peterson AoLBERS, RN, Talmage NapLISON H - 10/15/2017 14:16 EST   Advance Directive   Advance Directive :   No   Peterson AoLBERS, RN, Talmage NapALISON H - 10/15/2017 14:16 EST   Education   Written Language :   Lenox PondsEnglish   Primary Language :   Lyla SonEnglish   ALBERS, RN, Talmage NapALISON H - 10/15/2017 14:16 EST   Caregiver/Advocate Language   Patient :   Demonstration, Programme researcher, broadcasting/film/videorinted materials, Gwendolyn Chen explanation   Family :   Demonstration, Printed materials, Gwendolyn Chen explanation   ALBERS,  RN, Talmage Nap - 10/15/2017 14:16 EST   Barriers to Learning :   Acuity of Illness   Teaching Method :   Demonstration   Loralee Pacas - 10/15/2017 14:16 EST   DCP GENERIC CODE   Unit/Room Orientation :   Verbalizes understanding   Environmental Safety :   Verbalizes understanding   Hand Washing :   Verbalizes understanding   Infection Prevention :   Verbalizes understanding   DVT Prophylaxis :   Verbalizes  understanding   Isolation Precaution :   Verbalizes understanding   Peterson Ao, RN, Talmage Nap - 10/15/2017 14:16 EST   DC Needs   Living Situation :   Home independently   Peterson Ao RN, Talmage Nap - 10/15/2017 14:16 EST   Valuables and Belongings   Does Patient Have Valuables and Belongings :   Yes   Peterson Ao, RN, ALISON H - 10/15/2017 14:16 EST   DCP GENERIC CODE   At Bedside :   Cell phone   Peterson Ao, RN, ALISON H - 10/15/2017 14:16 EST   Patient Search Completed :   Salvadore Dom, RN, ALISON H - 10/15/2017 14:16 EST   Admission Complete   Admission Complete :   Yes   ALBERS, RN, ALISON H - 10/15/2017 14:16 EST

## 2017-10-15 NOTE — Nursing Note (Signed)
Medication Administration Follow Up-Text       Medication Administration Follow Up Entered On:  10/15/2017 17:16 EST    Performed On:  10/15/2017 17:16 EST by Peterson AoALBERS, RN, Talmage NapALISON H      Intervention Information:     ondansetron  Performed by Ladona RidgelAYLOR, RN, JENNIFER S on 10/15/2017 12:02:00 EST       ondansetron,4mg   IV Push,Antecubital, Right       Med Response   ED Medication Response :   Symptoms improved   Numeric Rating Pain Scale :   0 = No pain   ALBERS, RN, ALISON H - 10/15/2017 17:16 EST

## 2017-10-15 NOTE — Nursing Note (Signed)
Medication Administration Follow Up-Text       Medication Administration Follow Up Entered On:  10/15/2017 17:16 EST    Performed On:  10/15/2017 17:16 EST by Peterson AoALBERS, RN, Talmage NapALISON H      Intervention Information:     morphine  Performed by Ladona RidgelAYLOR, RN, JENNIFER S on 10/15/2017 12:02:00 EST       morphine,4mg   IV Push,Antecubital, Right       Med Response   ED Medication Response :   Symptoms improved   Numeric Rating Pain Scale :   0 = No pain   ALBERS, RN, ALISON H - 10/15/2017 17:16 EST

## 2017-10-16 NOTE — Nursing Note (Signed)
Medication Administration Follow Up-Text       Medication Administration Follow Up Entered On:  10/16/2017 3:56 EST    Performed On:  10/16/2017 1:40 EST by [facility], RN, BRITTANY A      Intervention Information:     morphine  Performed by [facility], RN, BRITTANY A on 10/16/2017 01:25:00 EST       morphine,2mg   IV Push,Antecubital, Left,severe pain (8-10)       Med Response   ED Medication Response :   Symptoms improved   Numeric Rating Pain Scale :   2   Pasero Opioid Induced Sedation Scale :   S = Sleep, easy to arouse   Respiratory Rate :   16 br/min   WELCH, RN, BRITTANY A - 10/16/2017 3:56 EST

## 2017-10-16 NOTE — Nursing Note (Signed)
Medication Administration Follow Up-Text       Medication Administration Follow Up Entered On:  10/16/2017 11:38 EST    Performed On:  10/16/2017 11:38 EST by Peterson AoALBERS, RN, ALISON H      Intervention Information:     morphine  Performed by Peterson AoALBERS, RN, ALISON H on 10/16/2017 08:33:00 EST       morphine,2mg   IV Push,Antecubital, Left,severe pain (8-10)       Med Response   ED Medication Response :   Symptoms improved   Numeric Rating Pain Scale :   5 = Moderate pain   ALBERS, RN, ALISON H - 10/16/2017 11:38 EST

## 2017-10-16 NOTE — Discharge Summary (Signed)
Discharge Summary    [facility]  Gwendolyn MinionSarbabi Teren Zurcher, MD  Admission Date: 10/15/2017  Discharged Date: 10/16/2017    HISTORY OF PRESENT ILLNESS:  [name] is a patient who is 36 years  old with past medical history of kidney stone and a long history of  smoking who presented with chest discomfort with evidence of acute  inferior myocardial infarction.  She underwent a cardiac  catheterization which showed a high-grade lesion in the PL branch in  the PDA.  She underwent successful PTCA with stent placement.  She has  done well post-PTCA with stent placement.  She is stable.  She was  monitored on telemetry post-PTCA with stent placement.  She remained  pain-free.  She is being discharged home to be followed up as an  outpatient.    PHYSICAL EXAMINATION:  At the time of discharge:  VITAL SIGNS were  stable.  NECK:  There is no JVD.  Carotid pulses are +2.  No bruits.   LUNGS:  Clear to auscultation, no rales, no crackles, no wheezing.   HEART:  First and second heart sounds are heard, normotensive, no  murmurs.  ABDOMEN:  Soft.  NEUROLOGIC:  She is alert and nonfocal.   EXTREMITIES:  No edema.    ASSESSMENT:  [name] presented with an acute inferior myocardial  infarction.  She underwent successful PTCA with stent placement.  She  is stable.    DISCHARGE DIAGNOSES:  1.  Acute inferior myocardial infarction.  2.  Successful percutaneous transluminal coronary angioplasty with  stent placement to the posterolateral and posterior descending.      Gwendolyn MinionSarbabi  Gwendolyn Hatchell, MD  TR: tn DD: 10/16/2017 16:09 TD: 10/16/2017 17:33 Job#: [id number]  \\[id number]\\DOC#: [id number]  \\[id number]\\  Signature Line    Electronically Signed on 12/19/2017 07:45 AM EST  ________________________________________________  Romana JuniperMASINDET-MD,  [place]

## 2017-10-16 NOTE — Discharge Summary (Signed)
 Inpatient Clinical Summary             [facility]  Post-Acute Care Transfer Instructions  PERSON INFORMATION   Name: [name], [name]   MRN: [id number]    FIN#: [id number]   PHYSICIANS  Admitting Physician: VONCILE KIM  Attending Physician: VONCILE KIM   PCP: PCP,  UNKNOWN  Discharge Diagnosis: Inferior MI  Comment:       PATIENT EDUCATION INFORMATION  Instructions:             Cardiac Catheterization; Coronary Stents  Medication Leaflets:               Follow-up:                           With: Address: When:   [facility], [address] [place], [place] [address]  [phone] Business (1)    Comments:   Follow up with Dr. Georjean in one week. Call for followup appointment. Go to Dr. Dain office in the morning to pick up samples of Brilinta.                             MEDICATION LIST  Medication Reconciliation at Discharge:          New Medications  Printed Prescriptions  ticagrelor (Brilinta (ticagrelor) 90 mg oral tablet) 1 Each Oral (given by mouth) 2 times a day. Refills: 0.  Last Dose:____________________  ticagrelor (Brilinta (ticagrelor) 90 mg oral tablet) 1 Each Oral (given by mouth) 2 times a day. Refills: 0.  Last Dose:____________________  Medications That Have Not Changed  Printed Prescriptions  atorvastatin (Lipitor 40 mg oral tablet) 2 Tabs Oral (given by mouth) every day. Refills: 0.  Last Dose:____________________  benazepril (Lotensin 20 mg oral tablet) 1 Tabs Oral (given by mouth) every day. Refills: 0.  Last Dose:____________________  Other Medications  aspirin (aspirin 81 mg oral delayed release tablet) 1 Tabs Oral (given by mouth) every day.  Last Dose:____________________         Patient's Final Home Medication List Upon Discharge:           aspirin (aspirin 81 mg oral delayed release tablet) 1 Tabs Oral (given by mouth) every day.  atorvastatin (Lipitor 40 mg oral tablet) 2 Tabs Oral (given by mouth) every day. Refills:  0.  benazepril (Lotensin 20 mg oral tablet) 1 Tabs Oral (given by mouth) every day. Refills: 0.  ticagrelor (Brilinta (ticagrelor) 90 mg oral tablet) 1 Each Oral (given by mouth) 2 times a day. Refills: 0.  ticagrelor (Brilinta (ticagrelor) 90 mg oral tablet) 1 Each Oral (given by mouth) 2 times a day. Refills: 0.         Comment:       ORDERS          Order Name Order Details   Discharge Patient 10/16/17 14:06:00 EST, Discharge Home/Self Care, when ok with Dr georjean

## 2017-10-16 NOTE — Nursing Note (Signed)
Nursing Discharge Summary - Text       Nursing Discharge Summary Entered On:  10/16/2017 14:47 EST    Performed On:  10/16/2017 14:44 EST by Peterson AoALBERS, RN, Talmage NapALISON H               DC Information   Discharge To, Anticipated :   Home independently   Transportation :   Private vehicle   Mode of Discharge :   Wheelchair   Loralee PacasLBERS, RN, ALISON H - 10/16/2017 14:44 EST   Education   Responsible Learner(s) :   Living Situation: Home independently        Performed by: Loralee PacasALBERS, RN, ALISON H - 10/15/2017 14:16  Discharge To: Home with family support        Performed by: Webb SilversmithWELCH, RN, BRITTANY A - 10/15/2017 20:13     Peterson AoALBERS, RN, Talmage NapALISON H - 10/16/2017 14:44 EST   Post-Hospital Education Adult Grid   Activity Expectations :   Bristol-Myers SquibbVerbalizes understanding   Community Resources :   [employer]ers SquibbVerbalizes understanding   Disease Process :   Verbalizes understanding   Importance of Follow-Up Visits :   Verbalizes understanding   Pain Management :   Verbalizes understanding   Physical Limitations :   Verbalizes understanding   Plan of Care :   Verbalizes understanding   Postoperative Instructions :   Verbalizes understanding   When to Call Health Care Provider :   [employer]ers SquibbVerbalizes understanding   Loralee PacasLBERS, RN, ALISON H - 10/16/2017 14:44 EST   Health Maintenance Education Adult Grid   Bathing/Hygiene :   TEFL teacherVerbalizes understanding   Diet/Nutrition :   TEFL teacherVerbalizes understanding   Exercise :   Verbalizes understanding   Smoking Cessation :   Verbalizes understanding   Peterson AoALBERS, RN, Talmage NapLISON H - 10/16/2017 14:44 EST   Medication Education Adult Grid   Med Dosage, Route, Scheduling :   TEFL teacherVerbalizes understanding   Med Generic/Brand Name, Purpose, Action :   IT sales professionalVerbalizes understanding   Safety, Medication :   Verbalizes understanding   Peterson AoLBERS, RTalmage Nap, ALISON H - 10/16/2017 14:44 EST   Safety Education Adult Grid   Safety, Fall :   Verbalizes understanding   Peterson AoLBERS, RTalmage Nap, ALISON H - 10/16/2017 14:44 EST   Time Spent Educating Patient :   45 minutes   Peterson AoALBERS, RN, ALISON H - 10/16/2017  14:44 EST

## 2017-10-16 NOTE — Nursing Note (Signed)
Medication Administration Follow Up-Text       Medication Administration Follow Up Entered On:  10/16/2017 15:27 EST    Performed On:  10/16/2017 15:27 EST by Peterson Ao, RN, ALISON H      Intervention Information:     morphine  Performed by Peterson Ao, RN, ALISON H on 10/16/2017 13:09:00 EST       morphine,2mg   IV Push,Antecubital, Left,severe pain (8-10)       Med Response   ED Medication Response :   Symptoms improved   Numeric Rating Pain Scale :   0 = No pain   Pasero Opioid Induced Sedation Scale :   1 = Awake and alert   Peterson Ao RN, Talmage Nap - 10/16/2017 15:27 EST

## 2017-10-16 NOTE — Discharge Summary (Signed)
 Inpatient Patient Summary               [facility]  [address]  [place], [place] [address]  [phone]  Patient Discharge Instructions    Name: Gwendolyn Chen, Gwendolyn Chen  Current Date: 10/16/2017 14:50:28  DOB: 1981-01-05 MRN: [id number] FIN: [id number]%[id number]  Patient Address: [address] [place] [address]  Patient Phone: [phone]  Primary Care Provider:  Name: PCP,  UNKNOWN  Phone:    Immunizations Provided:      Discharge Diagnosis: Inferior MI  Discharged To: TO, ANTICIPATED%>Home independently  Home Treatments: TREATMENTS, ANTICIPATED%>  Devices/Equipment: EQUIPMENT REHAB%>  Post Hospital Services: HOSPITAL SERVICES%>  Professional Skilled Services: SKILLED SERVICES%>  Therapist, sports and Community Resources: SERV AND COMM RES, ANTICIPATED%>  Mode of Discharge Transportation: TRANSPORTATION%>Private vehicle  Discharge Orders          Discharge Patient 10/16/17 14:06:00 EST, Discharge Home/Self Care, when ok with Dr georjean        Comment:     Medications   During the course of your visit, your medication list was updated with the most current information. The details of those changes are reflected below:          New Medications  Printed Prescriptions  ticagrelor (Brilinta (ticagrelor) 90 mg oral tablet) 1 Each Oral (given by mouth) 2 times a day. Refills: 0.  Last Dose:____________________  ticagrelor (Brilinta (ticagrelor) 90 mg oral tablet) 1 Each Oral (given by mouth) 2 times a day. Refills: 0.  Last Dose:____________________  Medications That Have Not Changed  Printed Prescriptions  atorvastatin (Lipitor 40 mg oral tablet) 2 Tabs Oral (given by mouth) every day. Refills: 0.  Last Dose:____________________  benazepril (Lotensin 20 mg oral tablet) 1 Tabs Oral (given by mouth) every day. Refills: 0.  Last Dose:____________________  Other Medications  aspirin (aspirin 81 mg oral delayed release tablet) 1 Tabs Oral (given by mouth) every day.  Last Dose:____________________        [facility] would like to thank you for allowing us  to assist you with your healthcare needs. The following includes patient education materials and information regarding your injury/illness.    Gwendolyn Chen, Gwendolyn Chen has been given the following list of follow-up instructions, prescriptions, and patient education materials:  Follow-up Instructions:              With: Address: When:   [facility], [address] [place], [place] [address]  [phone] Business (1)    Comments:   Follow up with Dr. georjean in one week. Call for followup appointment. Go to Dr. Dain office in the morning to pick up samples of Brilinta.                   It is important to always keep an active list of medications available so that you can share with other providers and manage your medications appropriately. As an additional courtesy, we are also providing you with your final active medications list that you can keep with you.           aspirin (aspirin 81 mg oral delayed release tablet) 1 Tabs Oral (given by mouth) every day.  atorvastatin (Lipitor 40 mg oral tablet) 2 Tabs Oral (given by mouth) every day. Refills: 0.  benazepril (Lotensin 20 mg oral tablet) 1 Tabs Oral (given by mouth) every day. Refills: 0.  ticagrelor (Brilinta (ticagrelor) 90 mg oral tablet) 1 Each Oral (given by mouth) 2 times a day.  Refills: 0.  ticagrelor (Brilinta (ticagrelor) 90 mg oral tablet) 1 Each Oral (given by mouth) 2 times a day. Refills: 0.      Take only the medications listed above. Contact your doctor prior to taking any medications not on this list.      Discharge instructions, if any, will display below    Instructions for Diet: INSTRUCTIONS FOR DIET%>   Instructions for Supplements: SUPPLEMENT INSTRUCTIONS%>   Instructions for Activity: INSTRUCTIONS FOR ACTIVITY%>   Instructions for Wound Care: INSTRUCTIONS FOR WOUND CARE%>    Medication leaflets, if any, will display below     Patient education materials, if any, will  display below        Having Cardiac Catheterization   You may have had chest pain (angina), dizziness, or other symptoms of heart trouble. To help diagnose your problem, your healthcare provider may advise a cardiac catheterization. This is a procedure that looks for a blockage or narrow area in the arteries around the heart. These can cause chest pain or a heart attack if not treated.   This common procedure may also be used to treat a heart problem. It may be done as a planned procedure if you have had chest pain in the past. Or it may be done right away to treat a suspected heart attack.       The catheter may be placed in the arm or the groin.    Before the procedure    Tell your healthcare team what medicines you take and about any allergies you have.    Dont eat or drink anything after midnight the night before the procedure, or as instructed by your healthcare team.   During the procedure    Hair may be trimmed where the catheter will be inserted.    You may be given medicine to relax before the procedure.    You will receive a local anesthetic to prevent pain at the insertion site.    A healthcare provider inserts a tube called a sheath into a blood vessel in your groin or arm.    Through the sheath, a long, thin tube called a catheter is placed inside the artery. The catheter is then guided toward your heart.    To do different tests or check other parts of the heart, the healthcare provider inserts a new catheter or moves the catheter or X-ray machine. For some tests, a contrast dye is injected through the catheter.   After the procedure    Your healthcare providers will tell you how long to lie down and keep the insertion site still.    If the insertion site was in your groin, you may need to lie down with your leg still for 2 or more hours. If a suture or closure device such as a collagen plug is used on the artery site to close the site, you may be able to move sooner. This depends on any  bleeding that occurs.    A nurse will check the insertion site and your blood pressure.    You may be asked to drink fluid to help flush the contrast liquid out of your system.    Have someone drive you home from the hospital.    Its normal to find a small bruise or lump at the insertion site. This should go away within a few weeks.   When to call your healthcare provider   Call your healthcare provider right away if you have any of the  following:    Chest pain (angina)    Pain, swelling, redness, bleeding, or fluid leaking at the insertion site    Severe pain, coldness, or a bluish color in the leg or arm that held the catheter    Blood in your urine, black or sticky stools, or any other kind of bleeding    Fever of 100.45F (38.0C) or higher, or as advised by your healthcare provider      [phone] [employer], [facility]. [address], [place], [place] [address]. All rights reserved. This information is not intended as a substitute for professional medical care. Always follow your healthcare professional's instructions.         Coronary Stents   A stent is a small metal coil or mesh tube that is placed in a narrowed artery to hold it open, which helps improve blood flow to your heart. The stent also helps keep the artery from re-narrowing (restenosis). Some stents slowly release medicine over a period of time. This reduces the amount of scar tissue that forms inside the artery, helping to prevent restenosis. A heart specialist called an interventional cardiologist does coronary angioplasty and stenting. He or she has specialized training in using the equipment  and in doing the procedure as safely as possible.       During the procedure    A member of your healthcare team will numb the skin at the insertion site (usually the groin, but sometimes the wrist) with a local anesthetic. He or she will make a needle puncture to insert the catheter.    Your doctor will insert a guide wire through the  catheter (a thin, flexible tube) and move it to the narrow spot in your heart's artery. Your doctor tracks its movement on an angiogram, a special kind of X-ray.    Your doctor will then insert a balloon-tipped catheter through the guide catheter and thread it over the guide wire. He or she will position it at the narrow part of the artery.    Your doctor will deliver a stent mounted on a balloon-tipped catheter to the blockage in your artery.    He or she will inflate the balloon to expand the stent.    The expanded stent further compresses the plaque against the arterial wall, increasing the blood flow to the heart muscle.   After the procedure    Your doctor will give you medicine to prevent blood clots from forming on the new stent. You will continue to take this medicine until the stent and artery have healed. Your healthcare team will tell you how long you should take this. Your doctor will give you a prescription before you go home.    Your healthcare team will tell you how long to lie down and keep the insertion site still. The amount of time may depend on whether a closure device such as a stitch or collagen plug was used to close the opening made in your artery. The time you must be still may be shorter if one of these devices was used. The amount of time will also depend on if there is any bleeding at the artery site.    If the insertion site was in your groin, you may need to lie down with your leg still for several hours.    A nurse will check your blood pressure and the insertion site.    You may be asked to drink fluid to help flush the contrast liquid out of  your system.    You may stay for several hours overnight in the hospital. Have someone drive you home from the hospital.    Its normal to find a small bruise or lump at the insertion site. This should disappear within a few weeks.   When to call your healthcare provider   Call your healthcare provider right away if you have any of the  following:    Angina (a feeling of pain, pressure, aching, tingling, or burning in the chest, back, neck, throat, jaw, arms, or shoulders)    Increasing pain, swelling, redness, bleeding, or drainage at the insertion site    Severe pain, coldness, or a bluish color in the leg or arm that held the catheter    Shortness of breath    Difficulty urinating or blood in your urine    Fever of 100.61F (38C) or higher, or as directed by your healthcare provider      [phone] [employer], [facility]. [address] [place], [place] [address]. All rights reserved. This information is not intended as a substitute for professional medical care. Always follow your healthcare professional's instructions.           IS IT A STROKE? Act FAST and Check for these signs:    FACE                         Does the face look uneven?    ARM                         Does one arm drift down?    SPEECH                    Does their speech sound strange?    TIME                         Call 9-1-1 at any sign of stroke  Heart Attack Signs  Chest discomfort: Most heart attacks involve discomfort in the center of the chest and lasts more than a few minutes, or goes away and comes back. It can feel like uncomfortable pressure, squeezing, fullness or pain.  Discomfort in upper body: Symptoms can include pain or discomfort in one or both arms, back, neck, jaw or stomach.  Shortness of breath: With or without discomfort.  Other signs: Breaking out in a cold sweat, nausea, or lightheaded.  Remember, MINUTES DO MATTER. If you experience any of these heart attack warning signs, call 9-1-1 to get immediate medical attention!     ---------------------------------------------------------------------------------------------------------------------  [facility] allows you to manage your health, view your test results, and retrieve your discharge documents from your hospital stay securely and conveniently from your computer.  To begin the  enrollment process, visit [email or web address] Click on "Sign up now" under [facility].

## 2017-10-16 NOTE — H&P (Signed)
History and Physical    [facility]  Gwendolyn MinionSarbabi Lathen Seal, MD  Admission Date: 10/15/2017    HISTORY OF PRESENT ILLNESS:  [name] is a patient who is 36 years  old with long history of smoking who presented to the Emergency Room  with substernal chest discomfort.  The patient developed chest  discomfort grade 1/4, with nausea and a brief syncopal episode.  EKG  done by EMS showed ST elevation inferiorly.  The patient was brought  to the cath lab as an emergency.  A STEMI was initiated.  She had a  cardiac cath, which showed a high-grade lesion in the posterolateral  branch, lesion in the PDA.  She underwent successful PTCA with stent  placement to the PL branch and the PDA.  The patient is pain free  post-PTCA with stent placement.  She was admitted to telemetry.    PAST MEDICAL HISTORY:  History of recent diagnosis of kidney stone.    SOCIAL HISTORY:  The patient has a long history of smoking.    FAMILY HISTORY:  No known family history of heart disease.    ALLERGIES:  CEPHALOSPORINS AND PENICILLIN.    MEDICATIONS:  She is not taking any meds at home.    PHYSICAL EXAMINATION:  VITAL SIGNS:  Stable.  NECK:  There is no JVD.   Carotid pulses are +2.  No bruits.  LUNGS:  Clear to auscultation.  No  rales, no crackles, no wheezing.  HEART:  First and second heart  sounds are heard, normotensive, no murmurs.  ABDOMEN:  Soft.   NEUROLOGIC:  She is alert and nonfocal.  EXTREMITIES:  No edema.      Cath showed a high-grade lesion in the PL branch and the PDA.  She  underwent successful PTCA with stent placement.    ASSESSMENT:  [name] presented with chest discomfort with ST  elevation inferiorly consistent with acute inferior myocardial  infarction.  She underwent PTCA with stent placement to the PL and  PDA.  She is pain-free.  She is being admitted to telemetry.    ADMITTING DIAGNOSIS:  Acute inferior myocardial infarction, status  post PTCA with stent placement.      Gwendolyn MinionSarbabi  Gwendolyn Mckeehan, MD  TR: dn DD: 10/16/2017  16:07 TD: 10/16/2017 16:48 Job#: [id number]  \\[id number]#: [id number]  \\[id number]\\  Signature Line    Electronically Signed on 12/19/2017 07:45 AM EST  ________________________________________________  Gwendolyn JuniperMASINDET-MD,  Gwendolyn Chen

## 2017-10-28 NOTE — Nursing Note (Signed)
Medication Administration Follow Up-Text       Medication Administration Follow Up Entered On:  10/28/2017 19:45 EST    Performed On:  10/28/2017 19:45 EST by Linton FlemingsBattie, RN, Lurena Joinerebecca      Intervention Information:     morphine  Performed by Linton FlemingsBattie, RN, Rebecca on 10/28/2017 19:28:00 EST       morphine,4mg   IV Push,Antecubital, Right       Med Response   ED Medication Response :   No adverse reaction, Symptoms improved   Linton FlemingsBattie, RLurena Joiner, Rebecca - 10/28/2017 19:45 EST

## 2017-10-28 NOTE — Nursing Note (Signed)
Medication Administration Follow Up-Text       Medication Administration Follow Up Entered On:  10/28/2017 20:47 EST    Performed On:  10/28/2017 20:47 EST by Linton FlemingsBattie, RN, Lurena Joinerebecca      Intervention Information:     ketorolac  Performed by Linton FlemingsBattie, RN, Rebecca on 10/28/2017 20:08:00 EST       ketorolac,30mg   IV Push,Antecubital, Right       Med Response   ED Medication Response :   No adverse reaction   Numeric Rating Pain Scale :   6   Linton FlemingsBattie, RLurena Joiner, Rebecca - 10/28/2017 20:47 EST

## 2017-10-28 NOTE — Nursing Note (Signed)
Medication Administration Follow Up-Text       Medication Administration Follow Up Entered On:  10/28/2017 19:07 EST    Performed On:  10/28/2017 19:07 EST by Linton FlemingsBattie, RN, Lurena Joinerebecca      Intervention Information:     morphine  Performed by Linton FlemingsBattie, RN, Rebecca on 10/28/2017 18:43:00 EST       morphine,4mg   IV Push,Hand, Right       Med Response   ED Medication Response :   No adverse reaction, Symptoms improved   Linton FlemingsBattie, RLurena Joiner, Rebecca - 10/28/2017 19:07 EST

## 2017-10-28 NOTE — Nursing Note (Signed)
Medication Administration Follow Up-Text       Medication Administration Follow Up Entered On:  10/28/2017 19:07 EST    Performed On:  10/28/2017 19:07 EST by Linton FlemingsBattie, RN, Lurena Joinerebecca      Intervention Information:     ondansetron  Performed by Linton FlemingsBattie, RN, Rebecca on 10/28/2017 18:43:00 EST       ondansetron,4mg   IV Push,Hand, Right       Med Response   ED Medication Response :   No adverse reaction, Symptoms improved   Linton FlemingsBattie, RLurena Joiner, Rebecca - 10/28/2017 19:07 EST

## 2018-06-02 NOTE — Nursing Note (Signed)
Medication Administration Follow Up-Text       Medication Administration Follow Up Entered On:  06/02/2018 0:39 EDT    Performed On:  06/02/2018 0:39 EDT by Leanora Coverarney, RN, Mitzi DavenportShelby      Intervention Information:     ondansetron  Performed by Leanora Coverarney, RN, Shelby on 06/01/2018 23:57:00 EDT       ondansetron,4mg   IV Push,Antecubital, Right       Med Response   ED Medication Response :   No adverse reaction, Symptoms improved   Carney, RN, [facility] - 06/02/2018 0:39 EDT

## 2018-06-02 NOTE — Nursing Note (Signed)
Medication Administration Follow Up-Text       Medication Administration Follow Up Entered On:  06/02/2018 0:39 EDT    Performed On:  06/02/2018 0:39 EDT by Leanora Coverarney, RN, Mitzi DavenportShelby      Intervention Information:     morphine  Performed by Leanora Coverarney, RN, Shelby on 06/01/2018 23:57:00 EDT       morphine,4mg   IV Push,Antecubital, Right       Med Response   ED Medication Response :   No adverse reaction, Symptoms improved   Carney, RN, [facility] - 06/02/2018 0:39 EDT

## 2018-07-26 NOTE — Nursing Note (Signed)
Medication Administration Follow Up-Text       Medication Administration Follow Up Entered On:  07/26/2018 22:34 EDT    Performed On:  07/26/2018 22:34 EDT by Mannie StabileJACOBIK,  EMILY A      Intervention Information:     morphine  Performed by Mannie StabileJACOBIK,  EMILY A on 07/26/2018 22:06:00 EDT       morphine,4mg   IV Push,Antecubital, Right       Med Response   ED Medication Response :   No adverse reaction, Symptoms improved   Pasero Opioid Induced Sedation Scale :   1 = Awake and alert   Respiratory Rate :   16 br/min   JACOBIK,  EMILY A - 07/26/2018 22:34 EDT

## 2018-07-26 NOTE — Nursing Note (Signed)
Medication Administration Follow Up-Text       Medication Administration Follow Up Entered On:  07/26/2018 21:15 EDT    Performed On:  07/26/2018 21:15 EDT by Mannie StabileJACOBIK,  EMILY A      Intervention Information:     ondansetron  Performed by Mannie StabileJACOBIK,  EMILY A on 07/26/2018 21:00:00 EDT       ondansetron,4mg   IV Push,Antecubital, Right       Med Response   ED Medication Response :   No adverse reaction, Symptoms improved, Provider notified   Numeric Rating Pain Scale :   0 = No pain   Pasero Opioid Induced Sedation Scale :   1 = Awake and alert   JACOBIK,  EMILY A - 07/26/2018 21:15 EDT

## 2018-07-27 LAB — COMPREHENSIVE METABOLIC PANEL
ALT: 23 U/L (ref 0–33)
AST: 22 U/L (ref 0–32)
Albumin/Globulin Ratio: 1.18 mmol/L (ref 1.00–2.00)
Albumin: 4 g/dL (ref 3.5–5.2)
Alk Phosphatase: 70 U/L (ref 35–117)
Anion Gap: 13 mmol/L (ref 2–17)
BUN: 12 mg/dL (ref 6–20)
CO2: 25 mmol/L (ref 22–29)
Calcium: 9 mg/dL (ref 8.6–10.0)
Chloride: 101 mmol/L (ref 98–107)
Creatinine: 0.7 mg/dL (ref 0.5–0.9)
GFR African American: 128 mL/min/{1.73_m2} (ref >=90)
GFR Non-African American: 111 mL/min/{1.73_m2} (ref >=90)
Globulin: 3.4 g/dL (ref 1.9–4.4)
Glucose: 113 mg/dL — ABNORMAL HIGH (ref 70–99)
Potassium: 3.6 mmol/L (ref 3.5–5.3)
Sodium: 139 mmol/L (ref 135–145)
Total Bilirubin: <0.15 mg/dL (ref 0.00–1.20)
Total Protein: 7.4 g/dL (ref 6.4–8.3)

## 2018-07-27 LAB — CBC
Hematocrit: 40.9 % (ref 34.0–47.0)
Hemoglobin: 13.5 g/dL (ref 11.5–15.7)
MCH: 26.9 pg — ABNORMAL LOW (ref 27.0–34.5)
MCHC: 33.1 g/dL (ref 32.0–36.0)
MCV: 81.2 fL (ref 81.0–99.0)
MPV: 8.1 fL (ref 7.2–11.2)
Platelets: 255 10*3/uL (ref 140–440)
RBC: 5.04 x10e6/mcL (ref 3.60–5.20)
RDW: 14.6 % (ref 11.0–16.0)
WBC: 11.5 10*3/uL — ABNORMAL HIGH (ref 3.8–10.6)

## 2018-07-27 LAB — URINALYSIS WITH MICROSCOPIC
Bilirubin Urine: NEGATIVE
Glucose, UA: NEGATIVE mg/dL
Ketones, Urine: NEGATIVE mg/dL
Leukocyte Esterase, Urine: NEGATIVE
Nitrite, Urine: NEGATIVE
Specific Gravity, UA: 1.02 (ref 1.003–1.035)
Urobilinogen, Urine: 0.2 EU/dL
pH, UA: 6.5 (ref 4.5–8.0)

## 2018-07-27 LAB — TROPONIN T: Troponin T: <0.01 ng/mL (ref 0.000–0.010)

## 2018-07-27 LAB — POCT TROPONIN
POC Troponin I: 0.02 ng/mL (ref 0.00–0.08)
POC Troponin I: 0.01 ng/mL (ref 0.00–0.08)

## 2018-07-27 LAB — POC PREGNANCY UR-QUAL: Preg Test, Ur: NEGATIVE

## 2018-07-27 LAB — LIPASE: Lipase: 17 U/L (ref 13–60)

## 2020-01-24 ENCOUNTER — Emergency Department: Payer: Self-pay

## 2020-01-24 ENCOUNTER — Other Ambulatory Visit: Payer: Self-pay

## 2020-01-24 ENCOUNTER — Encounter: Payer: Self-pay | Admitting: Emergency Medicine

## 2020-01-24 ENCOUNTER — Observation Stay
Admission: EM | Admit: 2020-01-24 | Discharge: 2020-01-25 | Disposition: A | Discharge status: Home / Self Care | Payer: Self-pay | Attending: Hospitalist | Admitting: Hospitalist

## 2020-01-24 DIAGNOSIS — G588 Other specified mononeuropathies: Secondary | ICD-10-CM | POA: Insufficient documentation

## 2020-01-24 DIAGNOSIS — R079 Chest pain, unspecified: Secondary | ICD-10-CM

## 2020-01-24 DIAGNOSIS — Z955 Presence of coronary angioplasty implant and graft: Secondary | ICD-10-CM | POA: Insufficient documentation

## 2020-01-24 DIAGNOSIS — I10 Essential (primary) hypertension: Secondary | ICD-10-CM | POA: Insufficient documentation

## 2020-01-24 DIAGNOSIS — N10 Acute tubulo-interstitial nephritis: Secondary | ICD-10-CM | POA: Insufficient documentation

## 2020-01-24 DIAGNOSIS — Z7982 Long term (current) use of aspirin: Secondary | ICD-10-CM | POA: Insufficient documentation

## 2020-01-24 DIAGNOSIS — I209 Angina pectoris, unspecified: Secondary | ICD-10-CM

## 2020-01-24 DIAGNOSIS — R0789 Other chest pain: Principal | ICD-10-CM | POA: Insufficient documentation

## 2020-01-24 DIAGNOSIS — I161 Hypertensive emergency: Secondary | ICD-10-CM

## 2020-01-24 DIAGNOSIS — Z91199 Patient's noncompliance with other medical treatment and regimen due to unspecified reason: Secondary | ICD-10-CM

## 2020-01-24 DIAGNOSIS — N23 Unspecified renal colic: Secondary | ICD-10-CM

## 2020-01-24 DIAGNOSIS — F17210 Nicotine dependence, cigarettes, uncomplicated: Secondary | ICD-10-CM | POA: Insufficient documentation

## 2020-01-24 DIAGNOSIS — Z9119 Patient's noncompliance with other medical treatment and regimen: Secondary | ICD-10-CM

## 2020-01-24 DIAGNOSIS — E876 Hypokalemia: Secondary | ICD-10-CM

## 2020-01-24 DIAGNOSIS — I252 Old myocardial infarction: Secondary | ICD-10-CM

## 2020-01-24 DIAGNOSIS — Z79899 Other long term (current) drug therapy: Secondary | ICD-10-CM | POA: Insufficient documentation

## 2020-01-24 DIAGNOSIS — M79602 Pain in left arm: Secondary | ICD-10-CM

## 2020-01-24 DIAGNOSIS — I2510 Atherosclerotic heart disease of native coronary artery without angina pectoris: Secondary | ICD-10-CM | POA: Insufficient documentation

## 2020-01-24 DIAGNOSIS — E785 Hyperlipidemia, unspecified: Secondary | ICD-10-CM | POA: Insufficient documentation

## 2020-01-24 DIAGNOSIS — Z91120 Patient's intentional underdosing of medication regimen due to financial hardship: Secondary | ICD-10-CM | POA: Insufficient documentation

## 2020-01-24 DIAGNOSIS — N200 Calculus of kidney: Secondary | ICD-10-CM

## 2020-01-24 DIAGNOSIS — Z20822 Contact with and (suspected) exposure to covid-19: Secondary | ICD-10-CM | POA: Insufficient documentation

## 2020-01-24 DIAGNOSIS — Z880 Allergy status to penicillin: Secondary | ICD-10-CM | POA: Insufficient documentation

## 2020-01-24 DIAGNOSIS — I160 Hypertensive urgency: Secondary | ICD-10-CM

## 2020-01-24 LAB — BASIC METABOLIC PANEL
Anion gap: 11 (ref 5–15)
BUN: 17 mg/dL (ref 6–20)
CO2: 27 mmol/L (ref 22–32)
Calcium: 9.3 mg/dL (ref 8.9–10.3)
Chloride: 101 mmol/L (ref 98–111)
Creatinine, Ser: 0.82 mg/dL (ref 0.44–1.00)
GFR calc Af Amer: >60 mL/min (ref >60)
GFR calc non Af Amer: >60 mL/min (ref >60)
Glucose, Bld: 113 mg/dL — ABNORMAL HIGH (ref 70–99)
Potassium: 3.1 mmol/L — ABNORMAL LOW (ref 3.5–5.1)
Sodium: 139 mmol/L (ref 135–145)

## 2020-01-24 LAB — CBC
HCT: 42.6 % (ref 36.0–46.0)
Hemoglobin: 13.9 g/dL (ref 12.0–15.0)
MCH: 26.5 pg (ref 26.0–34.0)
MCHC: 32.6 g/dL (ref 30.0–36.0)
MCV: 81.1 fL (ref 80.0–100.0)
Platelets: 293 10*3/uL (ref 150–400)
RBC: 5.25 MIL/uL — ABNORMAL HIGH (ref 3.87–5.11)
RDW: 14.5 % (ref 11.5–15.5)
WBC: 14.9 10*3/uL — ABNORMAL HIGH (ref 4.0–10.5)
nRBC: 0 % (ref 0.0–0.2)

## 2020-01-24 LAB — TROPONIN I (HIGH SENSITIVITY): Troponin I (High Sensitivity): 9 ng/L (ref <18)

## 2020-01-24 MED ORDER — MORPHINE SULFATE (PF) 4 MG/ML IV SOLN
INTRAVENOUS | Status: AC
  Administered 2020-01-24: 4 mg via INTRAVENOUS
  Filled 2020-01-24: qty 1

## 2020-01-24 MED ORDER — IOHEXOL 350 MG/ML SOLN
100.0000 mL | Freq: Once | INTRAVENOUS | Status: AC | PRN
  Administered 2020-01-24: 100 mL via INTRAVENOUS

## 2020-01-24 MED ORDER — ONDANSETRON HCL 4 MG/2ML IJ SOLN
INTRAMUSCULAR | Status: AC
  Administered 2020-01-24: 4 mg via INTRAVENOUS
  Filled 2020-01-24: qty 2

## 2020-01-24 MED ORDER — ONDANSETRON HCL 4 MG/2ML IJ SOLN: 4.0000 mg | Freq: Once | INTRAMUSCULAR | Status: AC

## 2020-01-24 MED ORDER — LABETALOL HCL 5 MG/ML IV SOLN
10.0000 mg | Freq: Once | INTRAVENOUS | Status: AC
  Administered 2020-01-24: 10 mg via INTRAVENOUS
  Filled 2020-01-24: qty 4

## 2020-01-24 MED ORDER — NITROGLYCERIN 0.4 MG SL SUBL
0.4000 mg | SUBLINGUAL_TABLET | SUBLINGUAL | Status: DC | PRN
  Administered 2020-01-24: 0.4 mg via SUBLINGUAL
  Filled 2020-01-24: qty 1

## 2020-01-24 MED ORDER — MORPHINE SULFATE (PF) 4 MG/ML IV SOLN: 4.0000 mg | Freq: Once | INTRAVENOUS | Status: AC

## 2020-01-24 MED ORDER — LORAZEPAM 2 MG/ML IJ SOLN
0.5000 mg | Freq: Once | INTRAMUSCULAR | Status: AC
  Administered 2020-01-24: 0.5 mg via INTRAVENOUS
  Filled 2020-01-24: qty 1

## 2020-01-24 NOTE — ED Triage Notes (Signed)
Patient presents from home with 8/10 chest pain radiating to her L arm. Patient has significant history of MI. Patient was prescribed medications following prior events but has not been able to afford her medications in over a year. Patient was at the grocery store when her pain started. Given 324 ASA en route by EMS

## 2020-01-24 NOTE — ED Notes (Signed)
Patient transported to CT 

## 2020-01-24 NOTE — ED Provider Notes (Addendum)
[facility] Emergency Department Provider Note       Time seen: ----------------------------------------- 10:31 PM on 01/24/2020 -----------------------------------------   I have reviewed the triage vital signs and the nursing notes.  HISTORY   Chief Complaint Chest Pain    HPI Brenda Todd is a 39 y.o. female with a history of hypertension, kidney stones, MI, who presents to the ED for chest pain.  Patient arrives by EMS with 8 out of 10 chest discomfort.  She describes it on her left side and she feels near syncopal.  She reports MI with stent placement x2 2 years ago with similar symptoms.  She also has been trying to pass a kidney stone recently.  She has not had any chest pain since MI 2 years ago.  Past Medical History:  Diagnosis Date  . Chronic hypertension   . Kidney calculi   . UTI (lower urinary tract infection)     Patient Active Problem List   Diagnosis Date Noted  . Postpartum care following cesarean delivery 12/17/2015  . Elevated blood pressure affecting pregnancy in third trimester, antepartum 12/08/2015  . Preeclampsia, severe 12/08/2015  . Nephrolithiasis 10/12/2015  . Supervision of high-risk pregnancy 10/11/2015  . Labor and delivery, indication for care 10/11/2015  . Hypertension   . Cramping affecting pregnancy, antepartum 09/18/2015  . Elevated BP 09/02/2015  . Pyelonephritis affecting pregnancy in second trimester, antepartum 08/08/2015  . Kidney infection 08/06/2015  . Pyelonephritis affecting pregnancy in second trimester 07/13/2015  . History of stillbirth 07/10/2015    Past Surgical History:  Procedure Laterality Date  . APPENDECTOMY     l/s. ruptured  . CESAREAN SECTION N/A 12/17/2015   Procedure: CESAREAN SECTION;  Surgeon: Honor Loh Ward, MD;  Location: [facility] ORS;  Service: Obstetrics;  Laterality: N/A;  . LITHOTRIPSY      Allergies Penicillins  Social History Social History   Tobacco Use  . Smoking  status: Former Research scientist (life sciences)  . Smokeless tobacco: Never Used  Substance Use Topics  . Alcohol use: No    Alcohol/week: 0.0 standard drinks  . Drug use: Yes    Types: Marijuana    Comment: Last used this Am.    Review of Systems Constitutional: Negative for fever. Cardiovascular: Positive for chest pain Respiratory: Negative for shortness of breath. Gastrointestinal: Negative for abdominal pain, vomiting and diarrhea. Musculoskeletal: Negative for back pain. Skin: Negative for rash. Neurological: Negative for headaches, focal weakness or numbness.  All systems negative/normal/unremarkable except as stated in the HPI  ____________________________________________   PHYSICAL EXAM:  VITAL SIGNS: ED Triage Vitals  Enc Vitals Group     BP 01/24/20 2229 (!) 200/128     Pulse Rate 01/24/20 2229 (!) 108     Resp 01/24/20 2229 13     Temp 01/24/20 2229 98.4 F (36.9 C)     Temp Source 01/24/20 2229 Oral     SpO2 01/24/20 2226 99 %     Weight 01/24/20 2230 193 lb (87.5 kg)     Height 01/24/20 2230 5\' 3"  (1.6 m)     Head Circumference --      Peak Flow --      Pain Score 01/24/20 2228 8     Pain Loc --      Pain Edu? --      Excl. in [place]? --     Constitutional: Alert and oriented.  Mild distress Eyes: Conjunctivae are normal. Normal extraocular movements. ENT      Head: Normocephalic  and atraumatic.      Nose: No congestion/rhinnorhea.      Mouth/Throat: Mucous membranes are moist.      Neck: No stridor. Cardiovascular: Normal rate, regular rhythm. No murmurs, rubs, or gallops. Respiratory: Normal respiratory effort without tachypnea nor retractions. Breath sounds are clear and equal bilaterally. No wheezes/rales/rhonchi. Gastrointestinal: Soft and nontender. Normal bowel sounds Musculoskeletal: Nontender with normal range of motion in extremities. No lower extremity tenderness nor edema. Neurologic:  Normal speech and language. No gross focal neurologic deficits are appreciated.   Skin:  Skin is warm, dry and intact. No rash noted. Psychiatric: Mood and affect are normal. Speech and behavior are normal.  ____________________________________________  EKG: Interpreted by me.  Sinus rhythm with rate of 99 bpm, probable LVH, normal axis, normal QT, nonspecific ST segment changes  ____________________________________________  ED COURSE:  As part of my medical decision making, I reviewed the following data within the [place] History obtained from family if available, nursing notes, old chart and ekg, as well as notes from prior ED visits. Patient presented for chest pain, we will assess with labs and imaging as indicated at this time.   Procedures  Brenda Todd was evaluated in Emergency Department on 01/24/2020 for the symptoms described in the history of present illness. She was evaluated in the context of the global COVID-19 pandemic, which necessitated consideration that the patient might be at risk for infection with the SARS-CoV-2 virus that causes COVID-19. Institutional protocols and algorithms that pertain to the evaluation of patients at risk for COVID-19 are in a state of rapid change based on information released by regulatory bodies including the CDC and federal and state organizations. These policies and algorithms were followed during the patient's care in the ED.  ____________________________________________   LABS (pertinent positives/negatives)  Labs Reviewed  CBC - Abnormal; Notable for the following components:      Result Value   WBC 14.9 (*)    RBC 5.25 (*)    All other components within normal limits  BASIC METABOLIC PANEL  TROPONIN I (HIGH SENSITIVITY)    RADIOLOGY Images were viewed by me  Chest x-ray  IMPRESSION:  No active disease.  ____________________________________________   DIFFERENTIAL DIAGNOSIS   MI, unstable angina, PE, dissection, anxiety, hypertensive emergency  FINAL ASSESSMENT AND PLAN  Chest  pain, hypertension   Plan: The patient had presented for chest pain like prior MI. Patient's labs are unremarkable with exception of leukocytosis. Patient's imaging not reveal any acute process.  Given her history she would be benefited by hospital admission and full cardiac work-up.   Laurence Aly, MD    Note: This note was generated in part or whole with voice recognition software. Voice recognition is usually quite accurate but there are transcription errors that can and very often do occur. I apologize for any typographical errors that were not detected and corrected.     Earleen Newport, MD 01/24/20 2312    Earleen Newport, MD 01/24/20 2326

## 2020-01-25 ENCOUNTER — Observation Stay: Payer: Self-pay

## 2020-01-25 ENCOUNTER — Observation Stay (HOSPITAL_BASED_OUTPATIENT_CLINIC_OR_DEPARTMENT_OTHER): Payer: Self-pay

## 2020-01-25 ENCOUNTER — Encounter: Payer: Self-pay | Admitting: Internal Medicine

## 2020-01-25 DIAGNOSIS — I10 Essential (primary) hypertension: Secondary | ICD-10-CM

## 2020-01-25 DIAGNOSIS — N200 Calculus of kidney: Secondary | ICD-10-CM

## 2020-01-25 DIAGNOSIS — R079 Chest pain, unspecified: Secondary | ICD-10-CM

## 2020-01-25 DIAGNOSIS — I25118 Atherosclerotic heart disease of native coronary artery with other forms of angina pectoris: Secondary | ICD-10-CM

## 2020-01-25 DIAGNOSIS — I209 Angina pectoris, unspecified: Secondary | ICD-10-CM

## 2020-01-25 DIAGNOSIS — Z91199 Patient's noncompliance with other medical treatment and regimen due to unspecified reason: Secondary | ICD-10-CM

## 2020-01-25 DIAGNOSIS — I160 Hypertensive urgency: Secondary | ICD-10-CM

## 2020-01-25 DIAGNOSIS — Z9119 Patient's noncompliance with other medical treatment and regimen: Secondary | ICD-10-CM

## 2020-01-25 DIAGNOSIS — E876 Hypokalemia: Secondary | ICD-10-CM

## 2020-01-25 LAB — PROTIME-INR
INR: 1 (ref 0.8–1.2)
Prothrombin Time: 12.7 seconds (ref 11.4–15.2)

## 2020-01-25 LAB — HIV ANTIBODY (ROUTINE TESTING W REFLEX): HIV Screen 4th Generation wRfx: NONREACTIVE

## 2020-01-25 LAB — CBC
HCT: 39 % (ref 36.0–46.0)
Hemoglobin: 12.7 g/dL (ref 12.0–15.0)
MCH: 26.8 pg (ref 26.0–34.0)
MCHC: 32.6 g/dL (ref 30.0–36.0)
MCV: 82.3 fL (ref 80.0–100.0)
Platelets: 257 10*3/uL (ref 150–400)
RBC: 4.74 MIL/uL (ref 3.87–5.11)
RDW: 14.6 % (ref 11.5–15.5)
WBC: 12.8 10*3/uL — ABNORMAL HIGH (ref 4.0–10.5)
nRBC: 0 % (ref 0.0–0.2)

## 2020-01-25 LAB — BASIC METABOLIC PANEL
Anion gap: 8 (ref 5–15)
BUN: 18 mg/dL (ref 6–20)
CO2: 28 mmol/L (ref 22–32)
Calcium: 8.4 mg/dL — ABNORMAL LOW (ref 8.9–10.3)
Chloride: 104 mmol/L (ref 98–111)
Creatinine, Ser: 0.83 mg/dL (ref 0.44–1.00)
GFR calc Af Amer: >60 mL/min (ref >60)
GFR calc non Af Amer: >60 mL/min (ref >60)
Glucose, Bld: 111 mg/dL — ABNORMAL HIGH (ref 70–99)
Potassium: 3.3 mmol/L — ABNORMAL LOW (ref 3.5–5.1)
Sodium: 140 mmol/L (ref 135–145)

## 2020-01-25 LAB — NM MYOCAR MULTI W/SPECT W/WALL MOTION / EF
LV dias vol: 86 mL (ref 46–106)
LV sys vol: 28 mL
Peak HR: 109 {beats}/min
Rest HR: 85 {beats}/min
SDS: 2
SRS: 15
SSS: 10
TID: 0.98

## 2020-01-25 LAB — LIPID PANEL
Cholesterol: 220 mg/dL — ABNORMAL HIGH (ref 0–200)
HDL: 36 mg/dL — ABNORMAL LOW (ref >40)
LDL Cholesterol: 131 mg/dL — ABNORMAL HIGH (ref 0–99)
Total CHOL/HDL Ratio: 6.1 RATIO
Triglycerides: 263 mg/dL — ABNORMAL HIGH (ref <150)
VLDL: 53 mg/dL — ABNORMAL HIGH (ref 0–40)

## 2020-01-25 LAB — URINE DRUG SCREEN, QUALITATIVE (ARMC ONLY)
Amphetamines, Ur Screen: NOT DETECTED
Barbiturates, Ur Screen: NOT DETECTED
Benzodiazepine, Ur Scrn: NOT DETECTED
Cannabinoid 50 Ng, Ur ~~LOC~~: NOT DETECTED
Cocaine Metabolite,Ur ~~LOC~~: NOT DETECTED
MDMA (Ecstasy)Ur Screen: NOT DETECTED
Methadone Scn, Ur: NOT DETECTED
Opiate, Ur Screen: POSITIVE — AB
Phencyclidine (PCP) Ur S: NOT DETECTED
Tricyclic, Ur Screen: NOT DETECTED

## 2020-01-25 LAB — URINALYSIS, COMPLETE (UACMP) WITH MICROSCOPIC
Bacteria, UA: NONE SEEN
Bilirubin Urine: NEGATIVE
Glucose, UA: NEGATIVE mg/dL
Ketones, ur: NEGATIVE mg/dL
Leukocytes,Ua: NEGATIVE
Nitrite: NEGATIVE
Protein, ur: NEGATIVE mg/dL
Specific Gravity, Urine: >1.046 — ABNORMAL HIGH (ref 1.005–1.030)
pH: 6 (ref 5.0–8.0)

## 2020-01-25 LAB — TROPONIN I (HIGH SENSITIVITY)
Troponin I (High Sensitivity): 13 ng/L (ref <18)
Troponin I (High Sensitivity): 11 ng/L (ref <18)

## 2020-01-25 LAB — RESPIRATORY PANEL BY RT PCR (FLU A&B, COVID)
Influenza A by PCR: NEGATIVE
Influenza B by PCR: NEGATIVE
SARS Coronavirus 2 by RT PCR: NEGATIVE

## 2020-01-25 MED ORDER — ATORVASTATIN CALCIUM 20 MG PO TABS: 20.0000 mg | ORAL_TABLET | Freq: Every day | ORAL | 2 refills | Status: DC

## 2020-01-25 MED ORDER — ASPIRIN EC 81 MG PO TBEC: 81.0000 mg | DELAYED_RELEASE_TABLET | Freq: Every day | ORAL | Status: DC

## 2020-01-25 MED ORDER — ONDANSETRON HCL 4 MG/2ML IJ SOLN: 4.0000 mg | Freq: Four times a day (QID) | INTRAMUSCULAR | Status: DC | PRN

## 2020-01-25 MED ORDER — ACETAMINOPHEN 500 MG PO TABS: 1000.0000 mg | ORAL_TABLET | Freq: Three times a day (TID) | ORAL | Status: DC | PRN

## 2020-01-25 MED ORDER — CLOPIDOGREL BISULFATE 75 MG PO TABS: 75.0000 mg | ORAL_TABLET | Freq: Every day | ORAL | 11 refills | Status: DC

## 2020-01-25 MED ORDER — TECHNETIUM TC 99M TETROFOSMIN IV KIT
30.5860 | PACK | Freq: Once | INTRAVENOUS | Status: AC | PRN
  Administered 2020-01-25: 30.586 via INTRAVENOUS

## 2020-01-25 MED ORDER — KETOROLAC TROMETHAMINE 30 MG/ML IJ SOLN
30.0000 mg | Freq: Once | INTRAMUSCULAR | Status: AC
  Administered 2020-01-25: 30 mg via INTRAVENOUS
  Filled 2020-01-25: qty 1

## 2020-01-25 MED ORDER — NITROGLYCERIN 0.4 MG SL SUBL: 0.4000 mg | SUBLINGUAL_TABLET | SUBLINGUAL | Status: DC | PRN

## 2020-01-25 MED ORDER — ATORVASTATIN CALCIUM 20 MG PO TABS: 20.0000 mg | ORAL_TABLET | Freq: Every day | ORAL | Status: DC

## 2020-01-25 MED ORDER — ENOXAPARIN SODIUM 40 MG/0.4ML ~~LOC~~ SOLN
40.0000 mg | SUBCUTANEOUS | Status: DC
  Administered 2020-01-25: 40 mg via SUBCUTANEOUS
  Filled 2020-01-25: qty 0.4

## 2020-01-25 MED ORDER — METOPROLOL TARTRATE 25 MG PO TABS
25.0000 mg | ORAL_TABLET | Freq: Two times a day (BID) | ORAL | Status: DC
  Administered 2020-01-25 (×2): 25 mg via ORAL
  Filled 2020-01-25 (×2): qty 1

## 2020-01-25 MED ORDER — CYCLOBENZAPRINE HCL 10 MG PO TABS: 10.0000 mg | ORAL_TABLET | Freq: Three times a day (TID) | ORAL | 0 refills | Status: DC | PRN

## 2020-01-25 MED ORDER — TECHNETIUM TC 99M TETROFOSMIN IV KIT
10.0000 | PACK | Freq: Once | INTRAVENOUS | Status: AC | PRN
  Administered 2020-01-25: 10.046 via INTRAVENOUS

## 2020-01-25 MED ORDER — REGADENOSON 0.4 MG/5ML IV SOLN
0.4000 mg | Freq: Once | INTRAVENOUS | Status: AC
  Administered 2020-01-25: 0.4 mg via INTRAVENOUS
  Filled 2020-01-25: qty 5

## 2020-01-25 MED ORDER — POTASSIUM CHLORIDE CRYS ER 20 MEQ PO TBCR
40.0000 meq | EXTENDED_RELEASE_TABLET | Freq: Once | ORAL | Status: AC
  Administered 2020-01-25: 40 meq via ORAL
  Filled 2020-01-25: qty 2

## 2020-01-25 MED ORDER — ACETAMINOPHEN 325 MG PO TABS: 650.0000 mg | ORAL_TABLET | ORAL | Status: DC | PRN

## 2020-01-25 NOTE — Discharge Summary (Signed)
Physician Discharge Summary   Brenda Todd  female DOB: 06-08-1981  AY:[id number]  PCP: Patient, No Pcp Per  Admit date: 01/24/2020 Discharge date: 01/25/2020  Admitted From: home Disposition:  home CODE STATUS: Full code  Discharge Instructions    Diet - low sodium heart healthy   Complete by: As directed    Discharge instructions   Complete by: As directed    Your stress test showed your heart is fine.  The pain and numbness in your left arm and hand are likely due to pinched nerves.  Xray showed nothing wrong with your spine, but your spine positioning was off due to positioning or muscle strain.  Because your shoulder muscle was tender when I pressed on it, it seemed pretty tight, so I prescribed you some muscle relaxant, Flexeril, to see if that helps.  Also do some range of motion exercises with your neck and shoulders.  Also try heat pad to relax your neck, shoulders and back.  And I have switched you to Plavix from Brilinta, per instruction of your previous cardiologist.  I have also prescribed you a cholesterol medication as well.   Dr. Enzo Bi - -   Increase activity slowly   Complete by: As directed        Hospital Course:  For full details, please see H&P, progress notes, consult notes and ancillary notes.  Briefly,  Brenda Todd is a 39 y.o. Caucasian female with medical history significant for MI 2 years prior with stent angioplasty, as well as history of hypertension and nephrolithiasis, who relocated to the area from [place] a year ago but has not established care and takes no medication due to lack of healthcare coverage who presented to the emergency room with a 2-hour onset of left-sided chest pain radiating to the left side of her neck and her left arm with numbness felt in the fingers of the left arm.    Chest pain, atypical, ACS ruled out Trop neg x3.  Due to pt's past extensive cardiac hx, cardiology was consulted who  performed a unclear stress test which was wnl.  Pain thought to be more consistent with radiculopathy.   History of MI (myocardial infarction) with stent angioplasty  Patient recalls being on aspirin and metoprolol prior to stopping taking meds a year ago due to no insurance.  Pt was on Brilinta for 6 months after stent placement and stopped due to cost.  Per pt, her previous cardiologist had wanted her to transition to plavix.  Pt was discharged on aspirin, atorvastatin, metoprolol and plavix.  # Left arm pain and numbness 2/2 pinched nerve Symptoms consistent with nerve impingement.  Xray showed "No acute osseous abnormality of the cervical spine" but "Reversal of the cervical lordosis, which may be positional or secondary to muscular strain."  Pt had tight shoulder muscles which was tender to palpation.  Pt was recommended ROM exercises for neck, heat to shoulder, and a Rx for muscle relaxant.    Hypertensive urgency Blood pressure 200/128 on arrival in the emergency room.  Labetalol was administered in the emergency room.  Pt was prescribed metop.  Nephrolithiasis with left renal colic CT showed 4 mm stone in the right retroperitoneum at the level of L3 appears to be within the ureter, however there is no hydronephrosis or obstructive change.  Hypokalemia Repleted with Oral potassium chloride     Discharge Diagnoses:  Principal Problem:   Ischemic chest pain [facility]) Active Problems:  Nephrolithiasis   Renal colic   History of MI (myocardial infarction)   Problem with medical care compliance   Hypokalemia   Hypertensive urgency    Discharge Instructions:  Allergies as of 01/25/2020      Reactions   Penicillins Anaphylaxis      Medication List    STOP taking these medications   Brilinta 60 MG Tabs tablet Generic drug: ticagrelor   HYDROcodone-acetaminophen 5-325 MG tablet Commonly known as: NORCO/VICODIN     TAKE these medications   aspirin 81 MG chewable  tablet Chew 81 mg by mouth daily.   atorvastatin 20 MG tablet Commonly known as: LIPITOR Take 1 tablet (20 mg total) by mouth daily at 6 PM.   clopidogrel 75 MG tablet Commonly known as: Plavix Take 1 tablet (75 mg total) by mouth daily.   cyclobenzaprine 10 MG tablet Commonly known as: FLEXERIL Take 1 tablet (10 mg total) by mouth 3 (three) times daily as needed for muscle spasms.   metoprolol tartrate 25 MG tablet Commonly known as: LOPRESSOR Take 25 mg by mouth 2 (two) times daily.   prenatal multivitamin Tabs tablet Take 1 tablet by mouth daily at 12 noon.         Allergies  Allergen Reactions  . Penicillins Anaphylaxis     The results of significant diagnostics from this hospitalization (including imaging, microbiology, ancillary and laboratory) are listed below for reference.   Consultations:   Procedures/Studies: DG Cervical Spine 2 or 3 views  Result Date: 01/25/2020 CLINICAL DATA:  Left neck and arm pain, hand numbness EXAM: CERVICAL SPINE - 2-3 VIEW COMPARISON:  None. FINDINGS: There is no evidence of cervical spine fracture or prevertebral soft tissue swelling. Straightening and slight reversal of the cervical lordosis. Facet joint alignment is normal. Intervertebral disc spaces are well maintained. No significant degenerative findings. No other significant bone abnormalities are identified. IMPRESSION: 1. No acute osseous abnormality of the cervical spine. 2. Reversal of the cervical lordosis, which may be positional or secondary to muscular strain. Electronically Signed   By: Davina Poke D.O.   On: 01/25/2020 14:08   NM Myocar Multi W/Spect W/Wall Motion / EF  Result Date: 01/25/2020 Pharmacological myocardial perfusion imaging study with no significant  ischemia Normal wall motion, EF estimated at 58% No EKG changes concerning for ischemia at peak stress or in recovery. Low risk scan Signed, Esmond Plants, MD, Ph.D [facility]   DG Chest [place] 1  View  Result Date: 01/24/2020 CLINICAL DATA:  Chest pain radiating to the left arm. EXAM: PORTABLE CHEST 1 VIEW COMPARISON:  07/21/2008 FINDINGS: The heart size and mediastinal contours are within normal limits. Both lungs are clear. The visualized skeletal structures are unremarkable. IMPRESSION: No active disease. Electronically Signed   By: Nelson Chimes M.D.   On: 01/24/2020 22:41   CT Angio Chest/Abd/Pel for Dissection W and/or Wo Contrast  Result Date: 01/25/2020 CLINICAL DATA:  Chest pain or back pain, aortic dissection suspected Left-sided chest pain. EXAM: CT ANGIOGRAPHY CHEST, ABDOMEN AND PELVIS TECHNIQUE: Multidetector CT imaging through the chest, abdomen and pelvis was performed using the standard protocol during bolus administration of intravenous contrast. Multiplanar reconstructed images and MIPs were obtained and reviewed to evaluate the vascular anatomy. CONTRAST:  176mL OMNIPAQUE IOHEXOL 350 MG/ML SOLN COMPARISON:  Radiograph yesterday. FINDINGS: CTA CHEST FINDINGS Cardiovascular: Normal caliber thoracic aorta without dissection, acute aortic syndrome, or aortic hematoma. No aortic aneurysm. There are no filling defects in the central pulmonary arteries to the  segmental level to suggest pulmonary embolus. Heart is normal in size. No pericardial effusion. Mediastinum/Nodes: No enlarged mediastinal or hilar lymph nodes. Decompressed esophagus. No suspicious thyroid nodule. Lungs/Pleura: Clear lungs. No focal airspace disease. No pleural fluid or pulmonary edema. The trachea and mainstem bronchi are patent. Musculoskeletal: There are no acute or suspicious osseous abnormalities. Review of the MIP images confirms the above findings. CTA ABDOMEN AND PELVIS FINDINGS VASCULAR Aorta: Normal caliber aorta without aneurysm, dissection, vasculitis or significant stenosis. Celiac: Replaced left hepatic artery arises directly from the aorta above the celiac artery, normal variant anatomy. Celiac artery is  widely patent without dissection, significant stenosis, or evidence of vasculitis. SMA: Patent without evidence of aneurysm, dissection, vasculitis or significant stenosis. Renals: Both renal arteries are patent without evidence of aneurysm, dissection, vasculitis, fibromuscular dysplasia or significant stenosis. IMA: Patent without evidence of aneurysm, dissection, vasculitis or significant stenosis. Inflow: Patent without evidence of aneurysm, dissection, vasculitis or significant stenosis. Veins: No obvious venous abnormality within the limitations of this arterial phase study. Review of the MIP images confirms the above findings. NON-VASCULAR Hepatobiliary: Hepatic steatosis. No evidence of focal lesion. Gallbladder physiologically distended, no calcified stone. No biliary dilatation. Pancreas: No ductal dilatation or inflammation. Spleen: Normal in size and arterial enhancement. Adrenals/Urinary Tract: Normal adrenal glands. 4 mm stone in the right retroperitoneum at the level of L3 appears to be within the ureter, however no proximal or distal ureteral dilatation. No hydronephrosis. Punctate stone in the left kidney is not seen, may have passed or be obscured by IV contrast on the current exam. No perinephric edema. There is homogeneous renal enhancement. Urinary bladder is physiologically distended. No bladder wall thickening. Stomach/Bowel: Nondistended stomach. No bowel inflammation or obstruction. Post appendectomy. Small volume of colonic stool. Lymphatic: No adenopathy. Reproductive: Uterus and bilateral adnexa are unremarkable. Other: No free air, free fluid, or intra-abdominal fluid collection. Musculoskeletal: There are no acute or suspicious osseous abnormalities. Review of the MIP images confirms the above findings. IMPRESSION: 1. Normal thoracoabdominal aorta without dissection or acute aortic abnormality. No central pulmonary embolus. 2. No acute chest findings or explanation for left chest pain.  3. A 4 mm stone in the right retroperitoneum at the level of L3 appears to be within the ureter, however there is no hydronephrosis or obstructive change. 4. Punctate nonobstructing left renal stone on 2017 CT is not seen, may have passed or be obscured by IV contrast on the current exam. 5. Hepatic steatosis. Electronically Signed   By: Keith Rake M.D.   On: 01/25/2020 00:16      Labs: BNP (last 3 results) No results for input(s): BNP in the last 8760 hours. Basic Metabolic Panel: Recent Labs  Lab 01/24/20 2229 01/25/20 0528  NA 139 140  K 3.1* 3.3*  CL 101 104  CO2 27 28  GLUCOSE 113* 111*  BUN 17 18  CREATININE 0.82 0.83  CALCIUM 9.3 8.4*   Liver Function Tests: No results for input(s): AST, ALT, ALKPHOS, BILITOT, PROT, ALBUMIN in the last 168 hours. No results for input(s): LIPASE, AMYLASE in the last 168 hours. No results for input(s): AMMONIA in the last 168 hours. CBC: Recent Labs  Lab 01/24/20 2229 01/25/20 0528  WBC 14.9* 12.8*  HGB 13.9 12.7  HCT 42.6 39.0  MCV 81.1 82.3  PLT 293 257   Cardiac Enzymes: No results for input(s): CKTOTAL, CKMB, CKMBINDEX, TROPONINI in the last 168 hours. BNP: Invalid input(s): POCBNP CBG: No results for input(s): GLUCAP in the  last 168 hours. D-Dimer No results for input(s): DDIMER in the last 72 hours. Hgb A1c No results for input(s): HGBA1C in the last 72 hours. Lipid Profile Recent Labs    01/25/20 0528  CHOL 220*  HDL 36*  LDLCALC 131*  TRIG 263*  CHOLHDL 6.1   Thyroid function studies No results for input(s): TSH, T4TOTAL, T3FREE, THYROIDAB in the last 72 hours.  Invalid input(s): FREET3 Anemia work up No results for input(s): VITAMINB12, FOLATE, FERRITIN, TIBC, IRON, RETICCTPCT in the last 72 hours. Urinalysis    Component Value Date/Time   COLORURINE YELLOW (A) 01/25/2020 0045   APPEARANCEUR CLEAR (A) 01/25/2020 0045   APPEARANCEUR Cloudy 12/11/2014 1620   LABSPEC >1.046 (H) 01/25/2020 0045    LABSPEC 1.014 12/11/2014 1620   PHURINE 6.0 01/25/2020 0045   GLUCOSEU NEGATIVE 01/25/2020 0045   GLUCOSEU Negative 12/11/2014 1620   HGBUR SMALL (A) 01/25/2020 0045   BILIRUBINUR NEGATIVE 01/25/2020 0045   BILIRUBINUR Negative 12/11/2014 1620   KETONESUR NEGATIVE 01/25/2020 0045   PROTEINUR NEGATIVE 01/25/2020 0045   UROBILINOGEN 1.0 04/05/2012 1807   NITRITE NEGATIVE 01/25/2020 0045   LEUKOCYTESUR NEGATIVE 01/25/2020 0045   LEUKOCYTESUR Negative 12/11/2014 1620   Sepsis Labs Invalid input(s): PROCALCITONIN,  WBC,  LACTICIDVEN Microbiology Recent Results (from the past 240 hour(s))  Respiratory Panel by RT PCR (Flu A&B, Covid) - Nasopharyngeal Swab     Status: None   Collection Time: 01/24/20 11:40 PM   Specimen: Nasopharyngeal Swab  Result Value Ref Range Status   SARS Coronavirus 2 by RT PCR NEGATIVE NEGATIVE Final    Comment: (NOTE) SARS-CoV-2 target nucleic acids are NOT DETECTED. The SARS-CoV-2 RNA is generally detectable in upper respiratoy specimens during the acute phase of infection. The lowest concentration of SARS-CoV-2 viral copies this assay can detect is 131 copies/mL. A negative result does not preclude SARS-Cov-2 infection and should not be used as the sole basis for treatment or other patient management decisions. A negative result may occur with  improper specimen collection/handling, submission of specimen other than nasopharyngeal swab, presence of viral mutation(s) within the areas targeted by this assay, and inadequate number of viral copies (<131 copies/mL). A negative result must be combined with clinical observations, patient history, and epidemiological information. The expected result is Negative. Fact Sheet for Patients:  PinkCheek.be Fact Sheet for Healthcare Providers:  GravelBags.it This test is not yet ap proved or cleared by the [place] FDA and  has been authorized for detection  and/or diagnosis of SARS-CoV-2 by FDA under an Emergency Use Authorization (EUA). This EUA will remain  in effect (meaning this test can be used) for the duration of the COVID-19 declaration under Section 564(b)(1) of the Act, 21 U.S.C. section 360bbb-3(b)(1), unless the authorization is terminated or revoked sooner.    Influenza A by PCR NEGATIVE NEGATIVE Final   Influenza B by PCR NEGATIVE NEGATIVE Final    Comment: (NOTE) The Xpert Xpress SARS-CoV-2/FLU/RSV assay is intended as an aid in  the diagnosis of influenza from Nasopharyngeal swab specimens and  should not be used as a sole basis for treatment. Nasal washings and  aspirates are unacceptable for Xpert Xpress SARS-CoV-2/FLU/RSV  testing. Fact Sheet for Patients: PinkCheek.be Fact Sheet for Healthcare Providers: GravelBags.it This test is not yet approved or cleared by the [place] FDA and  has been authorized for detection and/or diagnosis of SARS-CoV-2 by  FDA under an Emergency Use Authorization (EUA). This EUA will remain  in effect (meaning this test can  be used) for the duration of the  Covid-19 declaration under Section 564(b)(1) of the Act, 21  U.S.C. section 360bbb-3(b)(1), unless the authorization is  terminated or revoked. Performed at [facility], [address] [place], [place] [address]      Total time spend on discharging this patient, including the last patient exam, discussing the hospital stay, instructions for ongoing care as it relates to all pertinent caregivers, as well as preparing the medical discharge records, prescriptions, and/or referrals as applicable, is 45 minutes.    Enzo Bi, MD  [facility] 01/25/2020, 2:41 PM  If 7PM-7AM, please contact night-coverage

## 2020-01-25 NOTE — ED Notes (Signed)
Patient continues to complain of pain in her chest, but refuses Tylenol because it "tears her stomach up"

## 2020-01-25 NOTE — ED Notes (Signed)
Messaged on-call NP regarding patient's complaints of pain, asked for PRN order

## 2020-01-25 NOTE — Consult Note (Signed)
Cardiology Consultation:   Patient ID: Brenda Todd MRN: [id number]; DOB: 05/03/1981  Admit date: 01/24/2020 Date of Consult: 01/25/2020  Primary Care Provider: Patient, No Pcp Per Primary Cardiologist: New to [facility] Reason for consult: chest pain Physician requesting consult: Dr. Judd Gaudier    Patient Profile:   Brenda Todd is a 39 y.o. female with a hx of CAD, stent x2 in 2018, Kidney stones, hypertension, hyperlipidemia, presenting with chest pain starting last night   History of Present Illness:   Brenda Todd reports that she has been trying to pass a kidney stone past several days Has significant left flank pain Yesterday evening developed significant left neck left posterior shoulder pain radiating down the left arm, symptoms described as a sharp shooting pain down the left arm with numbness in her fingers Some discomfort radiating underneath the left breast  Concerned about the pain, she called EMS Feels it might be similar to prior anginal symptoms Denies any heavy lifting or trauma to her head or neck  Cardiac enzymes negative x2 in the emergency room EKG nonacute  Reports having discomfort in bed some worsening with movement and palpation posterior shoulder area.  Describes the muscles as feeling tight posterior shoulder.  Had some Toradol, this did not seem to help the pain  On my evaluation still with moderate pain left neck left posterior shoulder with numbness in her hand " Sharp shooting nerve pain down the left arm forearm area"  Heart Pathway Score:     Past Medical History:  Diagnosis Date  . Chronic hypertension   . Kidney calculi   . UTI (lower urinary tract infection)     Past Surgical History:  Procedure Laterality Date  . APPENDECTOMY     l/s. ruptured  . CESAREAN SECTION N/A 12/17/2015   Procedure: CESAREAN SECTION;  Surgeon: Honor Loh Ward, MD;  Location: [facility] ORS;  Service: Obstetrics;  Laterality: N/A;  . LITHOTRIPSY       Home  Medications:  Prior to Admission medications   Medication Sig Start Date End Date Taking? Authorizing Provider  aspirin 81 MG chewable tablet Chew 81 mg by mouth daily.   Yes [provider]  HYDROcodone-acetaminophen (NORCO/VICODIN) 5-325 MG tablet Take 1 tablet by mouth every 4 (four) hours as needed for moderate pain. Patient not taking: Reported on 01/24/2020 05/18/16   Letitia Neri L, PA-C  metoprolol tartrate (LOPRESSOR) 25 MG tablet Take 25 mg by mouth 2 (two) times daily.    [provider]  Prenatal Vit-Fe Fumarate-FA (PRENATAL MULTIVITAMIN) TABS tablet Take 1 tablet by mouth daily at 12 noon.    [provider]  ticagrelor (BRILINTA) 60 MG TABS tablet Take 60 mg by mouth 2 (two) times daily.    [provider]    Inpatient Medications: Scheduled Meds: . [START ON 01/26/2020] aspirin EC  81 mg Oral Daily  . atorvastatin  20 mg Oral q1800  . enoxaparin (LOVENOX) injection  40 mg Subcutaneous Q24H  . metoprolol tartrate  25 mg Oral BID   Continuous Infusions:  PRN Meds: acetaminophen, nitroGLYCERIN, nitroGLYCERIN, ondansetron (ZOFRAN) IV  Allergies:    Allergies  Allergen Reactions  . Penicillins Anaphylaxis    Social History:   Social History   Socioeconomic History  . Marital status: Divorced    Spouse name: Not on file  . Number of children: Not on file  . Years of education: Not on file  . Highest education level: Not on file  Occupational History  .  Not on file  Tobacco Use  . Smoking status: Current Every Day Smoker    Packs/day: 0.25    Types: Cigarettes  . Smokeless tobacco: Never Used  Substance and Sexual Activity  . Alcohol use: No    Alcohol/week: 0.0 standard drinks  . Drug use: Yes    Types: Marijuana  . Sexual activity: Yes    Birth control/protection: Condom  Other Topics Concern  . Not on file  Social History Narrative  . Not on file   Social Determinants of Health   Financial Resource Strain:   .  Difficulty of Paying Living Expenses: Not on file  Food Insecurity:   . Worried About Charity fundraiser in the Last Year: Not on file  . Ran Out of Food in the Last Year: Not on file  Transportation Needs:   . Lack of Transportation (Medical): Not on file  . Lack of Transportation (Non-Medical): Not on file  Physical Activity:   . Days of Exercise per Week: Not on file  . Minutes of Exercise per Session: Not on file  Stress:   . Feeling of Stress : Not on file  Social Connections:   . Frequency of Communication with Friends and Family: Not on file  . Frequency of Social Gatherings with Friends and Family: Not on file  . Attends Religious Services: Not on file  . Active Member of Clubs or Organizations: Not on file  . Attends Archivist Meetings: Not on file  . Marital Status: Not on file  Intimate Partner Violence:   . Fear of Current or Ex-Partner: Not on file  . Emotionally Abused: Not on file  . Physically Abused: Not on file  . Sexually Abused: Not on file    Family History:   *No family history on file.   ROS:  Please see the history of present illness.  Review of Systems  Constitutional: Negative.   HENT: Negative.   Respiratory: Negative.   Cardiovascular: Negative.   Gastrointestinal: Negative.   Musculoskeletal: Negative.   Neurological: Negative.   Psychiatric/Behavioral: Negative.   All other systems reviewed and are negative.   Physical Exam/Data:   Vitals:   01/25/20 0230 01/25/20 0330 01/25/20 0700 01/25/20 0730  BP: (!) 148/97 (!) 158/90 (!) 163/98 (!) 160/94  Pulse: 91 96 93 88  Resp: (!) 23 (!) 23 18 16   Temp:      TempSrc:      SpO2: 95% 97% 97% 94%  Weight:      Height:       No intake or output data in the 24 hours ending 01/25/20 1005 Last 3 Weights 01/24/2020 05/18/2016 05/03/2016  Weight (lbs) 193 lb 163 lb 163 lb  Weight (kg) 87.544 kg 73.936 kg 73.936 kg     Body mass index is 34.19 kg/m.  General:  Well nourished, well  developed, in no acute distress HEENT: normal Lymph: no adenopathy Neck: no JVD Endocrine:  No thryomegaly Vascular: No carotid bruits; FA pulses 2+ bilaterally without bruits  Cardiac:  normal S1, S2; RRR; no murmur  Lungs:  clear to auscultation bilaterally, no wheezing, rhonchi or rales  Abd: soft, nontender, no hepatomegaly  Ext: no edema Musculoskeletal:  No deformities, BUE and BLE strength normal and equal Skin: warm and dry  Neuro:  CNs 2-12 intact, no focal abnormalities noted Psych:  Normal affect   EKG:  The EKG was personally reviewed and demonstrates:   Normal sinus rhythm with no significant ST-T  wave changes  Telemetry:  Telemetry was personally reviewed and demonstrates: Normal sinus rhythm  Relevant CV Studies: Myoview pending  Laboratory Data:  High Sensitivity Troponin:   Recent Labs  Lab 01/24/20 2229 01/25/20 0045 01/25/20 0528  TROPONINIHS 9 13 11      Chemistry Recent Labs  Lab 01/24/20 2229 01/25/20 0528  NA 139 140  K 3.1* 3.3*  CL 101 104  CO2 27 28  GLUCOSE 113* 111*  BUN 17 18  CREATININE 0.82 0.83  CALCIUM 9.3 8.4*  GFRNONAA >60 >60  GFRAA >60 >60  ANIONGAP 11 8    No results for input(s): PROT, ALBUMIN, AST, ALT, ALKPHOS, BILITOT in the last 168 hours. Hematology Recent Labs  Lab 01/24/20 2229 01/25/20 0528  WBC 14.9* 12.8*  RBC 5.25* 4.74  HGB 13.9 12.7  HCT 42.6 39.0  MCV 81.1 82.3  MCH 26.5 26.8  MCHC 32.6 32.6  RDW 14.5 14.6  PLT 293 257   BNPNo results for input(s): BNP, PROBNP in the last 168 hours.  DDimer No results for input(s): DDIMER in the last 168 hours.   Radiology/Studies:  DG Chest Port 1 View  Result Date: 01/24/2020 CLINICAL DATA:  Chest pain radiating to the left arm. EXAM: PORTABLE CHEST 1 VIEW COMPARISON:  07/21/2008 FINDINGS: The heart size and mediastinal contours are within normal limits. Both lungs are clear. The visualized skeletal structures are unremarkable. IMPRESSION: No active disease.  Electronically Signed   By: Nelson Chimes M.D.   On: 01/24/2020 22:41   CT Angio Chest/Abd/Pel for Dissection W and/or Wo Contrast  Result Date: 01/25/2020 CLINICAL DATA:  Chest pain or back pain, aortic dissection suspected Left-sided chest pain. EXAM: CT ANGIOGRAPHY CHEST, ABDOMEN AND PELVIS TECHNIQUE: Multidetector CT imaging through the chest, abdomen and pelvis was performed using the standard protocol during bolus administration of intravenous contrast. Multiplanar reconstructed images and MIPs were obtained and reviewed to evaluate the vascular anatomy. CONTRAST:  163mL OMNIPAQUE IOHEXOL 350 MG/ML SOLN COMPARISON:  Radiograph yesterday. FINDINGS: CTA CHEST FINDINGS Cardiovascular: Normal caliber thoracic aorta without dissection, acute aortic syndrome, or aortic hematoma. No aortic aneurysm. There are no filling defects in the central pulmonary arteries to the segmental level to suggest pulmonary embolus. Heart is normal in size. No pericardial effusion. Mediastinum/Nodes: No enlarged mediastinal or hilar lymph nodes. Decompressed esophagus. No suspicious thyroid nodule. Lungs/Pleura: Clear lungs. No focal airspace disease. No pleural fluid or pulmonary edema. The trachea and mainstem bronchi are patent. Musculoskeletal: There are no acute or suspicious osseous abnormalities. Review of the MIP images confirms the above findings. CTA ABDOMEN AND PELVIS FINDINGS VASCULAR Aorta: Normal caliber aorta without aneurysm, dissection, vasculitis or significant stenosis. Celiac: Replaced left hepatic artery arises directly from the aorta above the celiac artery, normal variant anatomy. Celiac artery is widely patent without dissection, significant stenosis, or evidence of vasculitis. SMA: Patent without evidence of aneurysm, dissection, vasculitis or significant stenosis. Renals: Both renal arteries are patent without evidence of aneurysm, dissection, vasculitis, fibromuscular dysplasia or significant stenosis. IMA:  Patent without evidence of aneurysm, dissection, vasculitis or significant stenosis. Inflow: Patent without evidence of aneurysm, dissection, vasculitis or significant stenosis. Veins: No obvious venous abnormality within the limitations of this arterial phase study. Review of the MIP images confirms the above findings. NON-VASCULAR Hepatobiliary: Hepatic steatosis. No evidence of focal lesion. Gallbladder physiologically distended, no calcified stone. No biliary dilatation. Pancreas: No ductal dilatation or inflammation. Spleen: Normal in size and arterial enhancement. Adrenals/Urinary Tract: Normal adrenal glands. 4 mm  stone in the right retroperitoneum at the level of L3 appears to be within the ureter, however no proximal or distal ureteral dilatation. No hydronephrosis. Punctate stone in the left kidney is not seen, may have passed or be obscured by IV contrast on the current exam. No perinephric edema. There is homogeneous renal enhancement. Urinary bladder is physiologically distended. No bladder wall thickening. Stomach/Bowel: Nondistended stomach. No bowel inflammation or obstruction. Post appendectomy. Small volume of colonic stool. Lymphatic: No adenopathy. Reproductive: Uterus and bilateral adnexa are unremarkable. Other: No free air, free fluid, or intra-abdominal fluid collection. Musculoskeletal: There are no acute or suspicious osseous abnormalities. Review of the MIP images confirms the above findings. IMPRESSION: 1. Normal thoracoabdominal aorta without dissection or acute aortic abnormality. No central pulmonary embolus. 2. No acute chest findings or explanation for left chest pain. 3. A 4 mm stone in the right retroperitoneum at the level of L3 appears to be within the ureter, however there is no hydronephrosis or obstructive change. 4. Punctate nonobstructing left renal stone on 2017 CT is not seen, may have passed or be obscured by IV contrast on the current exam. 5. Hepatic steatosis.  Electronically Signed   By: Keith Rake M.D.   On: 01/25/2020 00:16   {   Assessment and Plan:   1. Chest pain Also with associated neck pain, radiating up the left neck with numbness in the fingers, nerve pain down left forearm --More consistent with cervical radiculopathy though she does report symptoms somewhat similar to prior angina --- Discussed various treatment options with her in detail Cardiac enzymes negative, EKG nonacute We have ordered a pharmacologic Myoview.  She is unable to treadmill in setting of pain and kidney stone discomfort --- Aspirin, beta-blocker, statin  2.  Kidney stone Reports having prior stones CT scan confirming 4 mm stone on the right but her pain is on the left May have passed the stone or obscured by contrast  3. CAD with stable angina We will request hospital records from outside facility Reports stent x2 placed 2018 Plan as above  4.  Hyperlipidemia Would confirm she is taking statin as outpatient, suspect medications of run out as she has not established with local physician --Would restart statin, goal LDL less than 70  5.  Hypertension, essential We will restart outpatient medications, Blood pressure may be elevated in the setting of pain left posterior shoulder, down the arm, kidney stone pain -Further medication adjustment this admission    Total encounter time more than 110 minutes  Greater than 50% was spent in counseling and coordination of care with the patient   For questions or updates, please contact [place] Please consult [email or web address] for contact info under     Signed, Ida Rogue, MD  01/25/2020 10:05 AM

## 2020-01-25 NOTE — Progress Notes (Signed)
Discharge instructions given to patient.  All questions answered.

## 2020-01-25 NOTE — H&P (Signed)
History and Physical    Brenda Todd [id number] DOB: January 28, 1981 DOA: 01/24/2020  PCP: Patient, No Pcp Per   Patient coming from: home I have personally briefly reviewed patient's old medical records in [place]  Chief Complaint: Left-sided chest pain  HPI: Brenda Todd is a 39 y.o. female with medical history significant for MI 2 years prior with stent angioplasty, as well as history of hypertension and nephrolithiasis, who relocated to the area from [facility] a year ago but has not established care and takes no medication due to lack of healthcare coverage who presents to the emergency room with a 2-hour onset of left-sided chest pain radiating to the left side of her neck and her left arm with numbness felt in the fingers of the left arm.  The pain is of moderate intensity and she describes it like a heaviness like an elephant sitting on her chest.  She has associated nausea and vomited once.  She denies shortness of breath, lightheadedness, palpitations or diaphoresis.  Denies cough fever or chills.  Patient states that she also has crampy' kidney stone' pain that has been bothering her for the past few days in the left flank distinct from the pain that started 2 hours prior to arrival..  ED Course: On arrival in the emergency room her blood pressure was 200/128, HR 108 with O2 sat 100% on room air.  She was afebrile.  First troponin was normal at 9 and EKG showed no acute ST-T wave changes.  Was significant for a white cell count of 14.9 thousand and potassium 3.1.  Chest x-ray showed no acute disease.  CT of the chest was subsequently done with results pending at time of hospitalist consult.  Review of Systems: As per HPI otherwise 10 point review of systems negative.    Past Medical History:  Diagnosis Date  . Chronic hypertension   . Kidney calculi   . UTI (lower urinary tract infection)     Past Surgical History:  Procedure Laterality Date  .  APPENDECTOMY     l/s. ruptured  . CESAREAN SECTION N/A 12/17/2015   Procedure: CESAREAN SECTION;  Surgeon: Honor Loh Ward, MD;  Location: [facility] ORS;  Service: Obstetrics;  Laterality: N/A;  . LITHOTRIPSY       reports that she has been smoking cigarettes. She has been smoking about 0.25 packs per day. She has never used smokeless tobacco. She reports current drug use. Drug: Marijuana. She reports that she does not drink alcohol.  Allergies  Allergen Reactions  . Penicillins Anaphylaxis    No family history on file.   Prior to Admission medications   Medication Sig Start Date End Date Taking? Authorizing Provider  aspirin 81 MG chewable tablet Chew 81 mg by mouth daily.   Yes [provider]  HYDROcodone-acetaminophen (NORCO/VICODIN) 5-325 MG tablet Take 1 tablet by mouth every 4 (four) hours as needed for moderate pain. Patient not taking: Reported on 01/24/2020 05/18/16   Letitia Neri L, PA-C  metoprolol tartrate (LOPRESSOR) 25 MG tablet Take 25 mg by mouth 2 (two) times daily.    [provider]  Prenatal Vit-Fe Fumarate-FA (PRENATAL MULTIVITAMIN) TABS tablet Take 1 tablet by mouth daily at 12 noon.    [provider]  ticagrelor (BRILINTA) 60 MG TABS tablet Take 60 mg by mouth 2 (two) times daily.    [provider]    Physical Exam: Vitals:   01/24/20 2245 01/24/20 2315 01/24/20 2330 01/24/20 2333  BP:   (!) 197/119 (!) 191/115  Pulse: (!) 107 (!) 102 99 97  Resp:   18 18  Temp:      TempSrc:      SpO2: 99% 98% 100% 100%  Weight:      Height:         Vitals:   01/24/20 2245 01/24/20 2315 01/24/20 2330 01/24/20 2333  BP:   (!) 197/119 (!) 191/115  Pulse: (!) 107 (!) 102 99 97  Resp:   18 18  Temp:      TempSrc:      SpO2: 99% 98% 100% 100%  Weight:      Height:        Constitutional: NAD, alert and oriented x 3 Eyes: PERRL, lids and conjunctivae normal ENMT: Mucous membranes are moist.  Neck: normal, supple, no masses, no  thyromegaly Respiratory: clear to auscultation bilaterally, no wheezing, no crackles. Normal respiratory effort. No accessory muscle use.  Cardiovascular: Regular rate and rhythm, no murmurs / rubs / gallops. No extremity edema. 2+ pedal pulses. No carotid bruits.  Abdomen: no tenderness, no masses palpated. No hepatosplenomegaly. Bowel sounds positive.  Musculoskeletal: no clubbing / cyanosis. No joint deformity upper and lower extremities.  Skin: no rashes, lesions, ulcers.  Neurologic: No gross focal neurologic deficit. Psychiatric: Normal mood and affect.   Labs on Admission: I have personally reviewed following labs and imaging studies  CBC: Recent Labs  Lab 01/24/20 2229  WBC 14.9*  HGB 13.9  HCT 42.6  MCV 81.1  PLT 0000000   Basic Metabolic Panel: Recent Labs  Lab 01/24/20 2229  NA 139  K 3.1*  CL 101  CO2 27  GLUCOSE 113*  BUN 17  CREATININE 0.82  CALCIUM 9.3   GFR: Estimated Creatinine Clearance: 97.5 mL/min (by C-G formula based on SCr of 0.82 mg/dL). Liver Function Tests: No results for input(s): AST, ALT, ALKPHOS, BILITOT, PROT, ALBUMIN in the last 168 hours. No results for input(s): LIPASE, AMYLASE in the last 168 hours. No results for input(s): AMMONIA in the last 168 hours. Coagulation Profile: No results for input(s): INR, PROTIME in the last 168 hours. Cardiac Enzymes: No results for input(s): CKTOTAL, CKMB, CKMBINDEX, TROPONINI in the last 168 hours. BNP (last 3 results) No results for input(s): PROBNP in the last 8760 hours. HbA1C: No results for input(s): HGBA1C in the last 72 hours. CBG: No results for input(s): GLUCAP in the last 168 hours. Lipid Profile: No results for input(s): CHOL, HDL, LDLCALC, TRIG, CHOLHDL, LDLDIRECT in the last 72 hours. Thyroid Function Tests: No results for input(s): TSH, T4TOTAL, FREET4, T3FREE, THYROIDAB in the last 72 hours. Anemia Panel: No results for input(s): VITAMINB12, FOLATE, FERRITIN, TIBC, IRON,  RETICCTPCT in the last 72 hours. Urine analysis:    Component Value Date/Time   COLORURINE YELLOW (A) 05/18/2016 1600   APPEARANCEUR HAZY (A) 05/18/2016 1600   APPEARANCEUR Cloudy 12/11/2014 1620   LABSPEC 1.015 05/18/2016 1600   LABSPEC 1.014 12/11/2014 1620   PHURINE 6.0 05/18/2016 1600   GLUCOSEU NEGATIVE 05/18/2016 1600   GLUCOSEU Negative 12/11/2014 1620   HGBUR 3+ (A) 05/18/2016 1600   BILIRUBINUR NEGATIVE 05/18/2016 1600   BILIRUBINUR Negative 12/11/2014 1620   KETONESUR NEGATIVE 05/18/2016 1600   PROTEINUR NEGATIVE 05/18/2016 1600   UROBILINOGEN 1.0 04/05/2012 1807   NITRITE NEGATIVE 05/18/2016 1600   LEUKOCYTESUR 1+ (A) 05/18/2016 1600   LEUKOCYTESUR Negative 12/11/2014 1620    Radiological Exams on Admission: DG Chest Port 1 View  Result  Date: 01/24/2020 CLINICAL DATA:  Chest pain radiating to the left arm. EXAM: PORTABLE CHEST 1 VIEW COMPARISON:  07/21/2008 FINDINGS: The heart size and mediastinal contours are within normal limits. Both lungs are clear. The visualized skeletal structures are unremarkable. IMPRESSION: No active disease. Electronically Signed   By: Nelson Chimes M.D.   On: 01/24/2020 22:41    EKG: Independently reviewed. No acute ST/T wave changes  Assessment/Plan Principal Problem:  Suspect Ischemic chest pain (HCC)  History of MI (myocardial infarction) with stent angioplasty  -Continue to cycle troponins.  First troponin was normal -Start heparin infusion if troponin uptrending in significant manner -Nitroglycerin sublingual as needed chest pain with morphine for breakthrough -Patient received aspirin in route to the hospital -Continue daily aspirin, atorvastatin, metoprolol -Patient recalls being on Brilinta, aspirin and metoprolol prior to stopping taking meds a year ago -CT angiogram of the chest negative for PE or dissection. -We will keep n.p.o. in case myoview study is planned  Active Problems:   Hypertensive urgency -Blood pressure  200/128 on arrival in the emergency room -Labetalol was administered in the emergency room -Labetalol as needed systolic over [id number] -Patient has a history of hypertensive pregnancy complications  Nephrolithiasis with left renal colic -Pain control -Follow urinalysis in view of WBC of 14,900 -CT showed 4 mm stone in the right retroperitoneum at the level of L3 appears to be within the ureter, however there is no hydronephrosis or obstructive change.  Hypokalemia -Oral potassium chloride and continue to monitor  Medication noncompliance with financial barriers to care -consider Transitional care consult for assistance      DVT prophylaxis: lovenox  Code Status: full code  Family Communication: none  Disposition Plan: Back to previous home environment Consults called: none     Athena Masse MD [facility]     01/25/2020, 12:02 AM

## 2020-08-31 ENCOUNTER — Other Ambulatory Visit: Payer: Self-pay

## 2020-08-31 ENCOUNTER — Emergency Department: Payer: Medicaid Other

## 2020-08-31 ENCOUNTER — Inpatient Hospital Stay
Admission: EM | Admit: 2020-08-31 | Discharge: 2020-09-03 | DRG: 280 | Disposition: A | Discharge status: Home / Self Care | Payer: Medicaid Other | Attending: Internal Medicine | Admitting: Internal Medicine

## 2020-08-31 DIAGNOSIS — Z20822 Contact with and (suspected) exposure to covid-19: Secondary | ICD-10-CM | POA: Diagnosis present

## 2020-08-31 DIAGNOSIS — F1990 Other psychoactive substance use, unspecified, uncomplicated: Secondary | ICD-10-CM | POA: Diagnosis not present

## 2020-08-31 DIAGNOSIS — E785 Hyperlipidemia, unspecified: Secondary | ICD-10-CM | POA: Diagnosis present

## 2020-08-31 DIAGNOSIS — Y92009 Unspecified place in unspecified non-institutional (private) residence as the place of occurrence of the external cause: Secondary | ICD-10-CM | POA: Diagnosis not present

## 2020-08-31 DIAGNOSIS — F17210 Nicotine dependence, cigarettes, uncomplicated: Secondary | ICD-10-CM | POA: Diagnosis present

## 2020-08-31 DIAGNOSIS — I361 Nonrheumatic tricuspid (valve) insufficiency: Secondary | ICD-10-CM | POA: Diagnosis not present

## 2020-08-31 DIAGNOSIS — Z880 Allergy status to penicillin: Secondary | ICD-10-CM

## 2020-08-31 DIAGNOSIS — N838 Other noninflammatory disorders of ovary, fallopian tube and broad ligament: Secondary | ICD-10-CM | POA: Diagnosis not present

## 2020-08-31 DIAGNOSIS — I371 Nonrheumatic pulmonary valve insufficiency: Secondary | ICD-10-CM | POA: Diagnosis not present

## 2020-08-31 DIAGNOSIS — I340 Nonrheumatic mitral (valve) insufficiency: Secondary | ICD-10-CM | POA: Diagnosis not present

## 2020-08-31 DIAGNOSIS — D500 Iron deficiency anemia secondary to blood loss (chronic): Secondary | ICD-10-CM | POA: Diagnosis not present

## 2020-08-31 DIAGNOSIS — I10 Essential (primary) hypertension: Secondary | ICD-10-CM | POA: Diagnosis present

## 2020-08-31 DIAGNOSIS — E781 Pure hyperglyceridemia: Secondary | ICD-10-CM | POA: Diagnosis present

## 2020-08-31 DIAGNOSIS — D62 Acute posthemorrhagic anemia: Secondary | ICD-10-CM | POA: Diagnosis present

## 2020-08-31 DIAGNOSIS — K921 Melena: Secondary | ICD-10-CM | POA: Diagnosis not present

## 2020-08-31 DIAGNOSIS — Z79899 Other long term (current) drug therapy: Secondary | ICD-10-CM

## 2020-08-31 DIAGNOSIS — Z91018 Allergy to other foods: Secondary | ICD-10-CM | POA: Diagnosis not present

## 2020-08-31 DIAGNOSIS — K254 Chronic or unspecified gastric ulcer with hemorrhage: Secondary | ICD-10-CM | POA: Diagnosis present

## 2020-08-31 DIAGNOSIS — Z87442 Personal history of urinary calculi: Secondary | ICD-10-CM | POA: Diagnosis not present

## 2020-08-31 DIAGNOSIS — I252 Old myocardial infarction: Secondary | ICD-10-CM

## 2020-08-31 DIAGNOSIS — I214 Non-ST elevation (NSTEMI) myocardial infarction: Secondary | ICD-10-CM | POA: Diagnosis present

## 2020-08-31 DIAGNOSIS — I272 Pulmonary hypertension, unspecified: Secondary | ICD-10-CM | POA: Diagnosis present

## 2020-08-31 DIAGNOSIS — Z7982 Long term (current) use of aspirin: Secondary | ICD-10-CM

## 2020-08-31 DIAGNOSIS — I200 Unstable angina: Secondary | ICD-10-CM | POA: Diagnosis not present

## 2020-08-31 DIAGNOSIS — D649 Anemia, unspecified: Secondary | ICD-10-CM | POA: Diagnosis not present

## 2020-08-31 DIAGNOSIS — Z6834 Body mass index (BMI) 34.0-34.9, adult: Secondary | ICD-10-CM | POA: Diagnosis not present

## 2020-08-31 DIAGNOSIS — D509 Iron deficiency anemia, unspecified: Secondary | ICD-10-CM | POA: Diagnosis present

## 2020-08-31 DIAGNOSIS — I088 Other rheumatic multiple valve diseases: Secondary | ICD-10-CM | POA: Diagnosis present

## 2020-08-31 DIAGNOSIS — T39395A Adverse effect of other nonsteroidal anti-inflammatory drugs [NSAID], initial encounter: Secondary | ICD-10-CM | POA: Diagnosis present

## 2020-08-31 DIAGNOSIS — I2510 Atherosclerotic heart disease of native coronary artery without angina pectoris: Secondary | ICD-10-CM | POA: Diagnosis not present

## 2020-08-31 DIAGNOSIS — E876 Hypokalemia: Secondary | ICD-10-CM | POA: Diagnosis present

## 2020-08-31 DIAGNOSIS — E669 Obesity, unspecified: Secondary | ICD-10-CM | POA: Diagnosis present

## 2020-08-31 DIAGNOSIS — I25110 Atherosclerotic heart disease of native coronary artery with unstable angina pectoris: Secondary | ICD-10-CM | POA: Diagnosis present

## 2020-08-31 DIAGNOSIS — Z955 Presence of coronary angioplasty implant and graft: Secondary | ICD-10-CM

## 2020-08-31 DIAGNOSIS — N83201 Unspecified ovarian cyst, right side: Secondary | ICD-10-CM

## 2020-08-31 DIAGNOSIS — G43909 Migraine, unspecified, not intractable, without status migrainosus: Secondary | ICD-10-CM | POA: Diagnosis present

## 2020-08-31 DIAGNOSIS — D3911 Neoplasm of uncertain behavior of right ovary: Secondary | ICD-10-CM | POA: Diagnosis present

## 2020-08-31 DIAGNOSIS — Z716 Tobacco abuse counseling: Secondary | ICD-10-CM

## 2020-08-31 DIAGNOSIS — I160 Hypertensive urgency: Secondary | ICD-10-CM | POA: Diagnosis present

## 2020-08-31 LAB — BASIC METABOLIC PANEL
Anion gap: 9 (ref 5–15)
BUN: 17 mg/dL (ref 6–20)
CO2: 24 mmol/L (ref 22–32)
Calcium: 8.6 mg/dL — ABNORMAL LOW (ref 8.9–10.3)
Chloride: 105 mmol/L (ref 98–111)
Creatinine, Ser: 0.73 mg/dL (ref 0.44–1.00)
GFR calc Af Amer: >60 mL/min (ref >60)
GFR calc non Af Amer: >60 mL/min (ref >60)
Glucose, Bld: 101 mg/dL — ABNORMAL HIGH (ref 70–99)
Potassium: 3.2 mmol/L — ABNORMAL LOW (ref 3.5–5.1)
Sodium: 138 mmol/L (ref 135–145)

## 2020-08-31 LAB — POC URINE PREG, ED: Preg Test, Ur: NEGATIVE

## 2020-08-31 LAB — CBC
HCT: 30.6 % — ABNORMAL LOW (ref 36.0–46.0)
Hemoglobin: 10 g/dL — ABNORMAL LOW (ref 12.0–15.0)
MCH: 26.9 pg (ref 26.0–34.0)
MCHC: 32.7 g/dL (ref 30.0–36.0)
MCV: 82.3 fL (ref 80.0–100.0)
Platelets: 249 10*3/uL (ref 150–400)
RBC: 3.72 MIL/uL — ABNORMAL LOW (ref 3.87–5.11)
RDW: 15.4 % (ref 11.5–15.5)
WBC: 12.1 10*3/uL — ABNORMAL HIGH (ref 4.0–10.5)
nRBC: 0 % (ref 0.0–0.2)

## 2020-08-31 LAB — TROPONIN I (HIGH SENSITIVITY)
Troponin I (High Sensitivity): 230 ng/L (ref <18)
Troponin I (High Sensitivity): 263 ng/L (ref <18)

## 2020-08-31 LAB — PROTIME-INR
INR: 1 (ref 0.8–1.2)
Prothrombin Time: 12.5 seconds (ref 11.4–15.2)

## 2020-08-31 LAB — APTT: aPTT: 27 seconds (ref 24–36)

## 2020-08-31 MED ORDER — HEPARIN (PORCINE) 25000 UT/250ML-% IV SOLN
1600.0000 [IU]/h | INTRAVENOUS | Status: DC
  Administered 2020-08-31: 850 [IU]/h via INTRAVENOUS
  Administered 2020-09-01: 1400 [IU]/h via INTRAVENOUS
  Filled 2020-08-31 (×2): qty 250

## 2020-08-31 MED ORDER — ALPRAZOLAM 0.25 MG PO TABS
0.2500 mg | ORAL_TABLET | Freq: Two times a day (BID) | ORAL | Status: DC | PRN
  Administered 2020-09-02: 0.25 mg via ORAL
  Filled 2020-08-31: qty 1

## 2020-08-31 MED ORDER — MORPHINE SULFATE (PF) 4 MG/ML IV SOLN
4.0000 mg | Freq: Once | INTRAVENOUS | Status: AC
  Administered 2020-08-31: 4 mg via INTRAVENOUS

## 2020-08-31 MED ORDER — HEPARIN BOLUS VIA INFUSION
4000.0000 [IU] | Freq: Once | INTRAVENOUS | Status: AC
  Administered 2020-08-31: 4000 [IU] via INTRAVENOUS
  Filled 2020-08-31: qty 4000

## 2020-08-31 MED ORDER — NITROGLYCERIN 0.4 MG SL SUBL
0.4000 mg | SUBLINGUAL_TABLET | SUBLINGUAL | Status: DC | PRN
  Administered 2020-08-31 – 2020-09-01 (×2): 0.4 mg via SUBLINGUAL
  Filled 2020-08-31 (×2): qty 1

## 2020-08-31 MED ORDER — ASPIRIN 81 MG PO CHEW
324.0000 mg | CHEWABLE_TABLET | Freq: Once | ORAL | Status: AC
  Administered 2020-08-31: 324 mg via ORAL
  Filled 2020-08-31: qty 4

## 2020-08-31 MED ORDER — ASPIRIN EC 81 MG PO TBEC: 81.0000 mg | DELAYED_RELEASE_TABLET | Freq: Every day | ORAL | Status: DC

## 2020-08-31 MED ORDER — ZOLPIDEM TARTRATE 5 MG PO TABS
5.0000 mg | ORAL_TABLET | Freq: Every evening | ORAL | Status: DC | PRN
  Administered 2020-09-02: 5 mg via ORAL
  Filled 2020-08-31: qty 1

## 2020-08-31 MED ORDER — MORPHINE SULFATE (PF) 2 MG/ML IV SOLN
INTRAVENOUS | Status: AC
  Administered 2020-08-31: 2 mg via INTRAVENOUS
  Filled 2020-08-31: qty 1

## 2020-08-31 MED ORDER — IOHEXOL 350 MG/ML SOLN
100.0000 mL | Freq: Once | INTRAVENOUS | Status: AC | PRN
  Administered 2020-08-31: 100 mL via INTRAVENOUS

## 2020-08-31 MED ORDER — NITROGLYCERIN IN D5W 200-5 MCG/ML-% IV SOLN
0.0000 ug/min | INTRAVENOUS | Status: DC
  Administered 2020-08-31: 5 ug/min via INTRAVENOUS

## 2020-08-31 MED ORDER — SODIUM CHLORIDE 0.9 % IV SOLN: INTRAVENOUS | Status: DC

## 2020-08-31 MED ORDER — ASPIRIN 300 MG RE SUPP: 300.0000 mg | RECTAL | Status: DC

## 2020-08-31 MED ORDER — ATORVASTATIN CALCIUM 80 MG PO TABS
80.0000 mg | ORAL_TABLET | Freq: Every day | ORAL | Status: DC
  Administered 2020-09-01 – 2020-09-03 (×4): 80 mg via ORAL
  Filled 2020-08-31: qty 1
  Filled 2020-08-31: qty 4
  Filled 2020-08-31 (×2): qty 1

## 2020-08-31 MED ORDER — MORPHINE SULFATE (PF) 2 MG/ML IV SOLN
2.0000 mg | INTRAVENOUS | Status: DC | PRN
  Administered 2020-09-01 – 2020-09-02 (×6): 2 mg via INTRAVENOUS
  Filled 2020-08-31 (×6): qty 1

## 2020-08-31 MED ORDER — MORPHINE SULFATE (PF) 4 MG/ML IV SOLN
4.0000 mg | Freq: Once | INTRAVENOUS | Status: AC
  Administered 2020-08-31: 4 mg via INTRAVENOUS
  Filled 2020-08-31: qty 1

## 2020-08-31 MED ORDER — MORPHINE SULFATE (PF) 4 MG/ML IV SOLN
INTRAVENOUS | Status: AC
  Filled 2020-08-31: qty 1

## 2020-08-31 MED ORDER — METOPROLOL TARTRATE 25 MG PO TABS
25.0000 mg | ORAL_TABLET | Freq: Two times a day (BID) | ORAL | Status: DC
  Administered 2020-09-01 – 2020-09-03 (×6): 25 mg via ORAL
  Filled 2020-08-31 (×6): qty 1

## 2020-08-31 MED ORDER — LABETALOL HCL 5 MG/ML IV SOLN
10.0000 mg | Freq: Once | INTRAVENOUS | Status: AC
  Administered 2020-08-31: 10 mg via INTRAVENOUS
  Filled 2020-08-31: qty 4

## 2020-08-31 MED ORDER — ASPIRIN 81 MG PO CHEW: 324.0000 mg | CHEWABLE_TABLET | ORAL | Status: DC

## 2020-08-31 MED ORDER — ONDANSETRON HCL 4 MG/2ML IJ SOLN: 4.0000 mg | Freq: Four times a day (QID) | INTRAMUSCULAR | Status: DC | PRN

## 2020-08-31 MED ORDER — ACETAMINOPHEN 325 MG PO TABS: 650.0000 mg | ORAL_TABLET | ORAL | Status: DC | PRN

## 2020-08-31 MED ORDER — ASPIRIN 81 MG PO CHEW
81.0000 mg | CHEWABLE_TABLET | Freq: Every day | ORAL | Status: DC
  Administered 2020-09-01 – 2020-09-03 (×3): 81 mg via ORAL
  Filled 2020-08-31 (×3): qty 1

## 2020-08-31 MED ORDER — ONDANSETRON HCL 4 MG/2ML IJ SOLN
4.0000 mg | Freq: Once | INTRAMUSCULAR | Status: AC
  Administered 2020-08-31: 4 mg via INTRAVENOUS
  Filled 2020-08-31: qty 2

## 2020-08-31 NOTE — ED Provider Notes (Signed)
[facility] Emergency Department Provider Note  ____________________________________________   First MD Initiated Contact with Patient 08/31/20 1920     (approximate)  I have reviewed the triage vital signs and the nursing notes.   HISTORY  Chief Complaint Chest Pain    HPI Brenda Todd is a 39 y.o. female with past medical history of hypertension, history of MI, here with chest pain.  The patient states that she was at work today when she experienced acute onset of dull, aching, substernal chest pressure.  She had an occasional mild headache, as well as sensation of some radiation towards the back.  She also felt like she had some tingling in her right hand.  The pain began fairly acutely.  She has been given nitroglycerin without significant relief.  Denies any preceding stressors, other than increased rest at work.  She was not exerting herself at the time.  No recent fevers chills.  No other acute complaints.  No specific alleviating factors.        Past Medical History:  Diagnosis Date  . Chronic hypertension   . Kidney calculi   . UTI (lower urinary tract infection)     Patient Active Problem List   Diagnosis Date Noted  . NSTEMI (non-ST elevated myocardial infarction) ([facility]) 08/31/2020  . Problem with medical care compliance 01/25/2020  . Hypokalemia 01/25/2020  . Hypertensive urgency 01/25/2020  . Renal colic 86/76/7209  . History of MI (myocardial infarction) 01/24/2020  . Ischemic chest pain ([place]) 01/24/2020  . Postpartum care following cesarean delivery 12/17/2015  . Elevated blood pressure affecting pregnancy in third trimester, antepartum 12/08/2015  . Preeclampsia, severe 12/08/2015  . Nephrolithiasis 10/12/2015  . Supervision of high-risk pregnancy 10/11/2015  . Labor and delivery, indication for care 10/11/2015  . Hypertension   . Cramping affecting pregnancy, antepartum 09/18/2015  . Elevated BP 09/02/2015  . Pyelonephritis  affecting pregnancy in second trimester, antepartum 08/08/2015  . Kidney infection 08/06/2015  . Pyelonephritis affecting pregnancy in second trimester 07/13/2015  . History of stillbirth 07/10/2015    Past Surgical History:  Procedure Laterality Date  . APPENDECTOMY     l/s. ruptured  . CESAREAN SECTION N/A 12/17/2015   Procedure: CESAREAN SECTION;  Surgeon: Honor Loh Ward, MD;  Location: [facility] ORS;  Service: Obstetrics;  Laterality: N/A;  . LITHOTRIPSY      Prior to Admission medications   Medication Sig Start Date End Date Taking? Authorizing Provider  aspirin 81 MG chewable tablet Chew 81 mg by mouth daily.   Yes [provider]  diphenhydrAMINE (BENADRYL) 25 MG tablet Take 25 mg by mouth every 6 (six) hours as needed.   Yes [provider]  ibuprofen (ADVIL) 200 MG tablet Take 200 mg by mouth every 6 (six) hours as needed.   Yes [provider]  metoprolol tartrate (LOPRESSOR) 25 MG tablet Take 25 mg by mouth 2 (two) times daily.   Yes [provider]  atorvastatin (LIPITOR) 20 MG tablet Take 1 tablet (20 mg total) by mouth daily at 6 PM. Patient not taking: Reported on 08/31/2020 01/25/20 08/31/20  Enzo Bi, MD  clopidogrel (PLAVIX) 75 MG tablet Take 75 mg by mouth daily. Patient not taking: Reported on 08/31/2020    [provider]    Allergies No healthtouch food allergies and Penicillins  History reviewed. No pertinent family history.  Social History Social History   Tobacco Use  . Smoking status: Current Every Day Smoker    Packs/day:  0.25    Types: Cigarettes  . Smokeless tobacco: Never Used  Vaping Use  . Vaping Use: Never used  Substance Use Topics  . Alcohol use: No    Alcohol/week: 0.0 standard drinks  . Drug use: Yes    Types: Marijuana    Review of Systems  Review of Systems  Constitutional: Positive for fatigue. Negative for fever.  HENT: Negative for congestion and sore throat.   Eyes: Negative for visual  disturbance.  Respiratory: Positive for shortness of breath. Negative for cough.   Cardiovascular: Positive for chest pain.  Gastrointestinal: Negative for abdominal pain, diarrhea, nausea and vomiting.  Genitourinary: Negative for flank pain.  Musculoskeletal: Negative for back pain and neck pain.  Skin: Negative for rash and wound.  Neurological: Negative for weakness.  All other systems reviewed and are negative.    ____________________________________________  PHYSICAL EXAM:      VITAL SIGNS: ED Triage Vitals  Enc Vitals Group     BP 08/31/20 1857 (!) 194/106     Pulse Rate 08/31/20 1857 94     Resp 08/31/20 1857 16     Temp 08/31/20 1857 97.6 F (36.4 C)     Temp Source 08/31/20 1857 Oral     SpO2 08/31/20 1857 99 %     Weight 08/31/20 1858 193 lb (87.5 kg)     Height 08/31/20 1858 5\' 3"  (1.6 m)     Head Circumference --      Peak Flow --      Pain Score 08/31/20 1858 8     Pain Loc --      Pain Edu? --      Excl. in [place]? --      Physical Exam    ____________________________________________   LABS (all labs ordered are listed, but only abnormal results are displayed)  Labs Reviewed  BASIC METABOLIC PANEL - Abnormal; Notable for the following components:      Result Value   Potassium 3.2 (*)    Glucose, Bld 101 (*)    Calcium 8.6 (*)    All other components within normal limits  CBC - Abnormal; Notable for the following components:   WBC 12.1 (*)    RBC 3.72 (*)    Hemoglobin 10.0 (*)    HCT 30.6 (*)    All other components within normal limits  TROPONIN I (HIGH SENSITIVITY) - Abnormal; Notable for the following components:   Troponin I (High Sensitivity) 230 (*)    All other components within normal limits  TROPONIN I (HIGH SENSITIVITY) - Abnormal; Notable for the following components:   Troponin I (High Sensitivity) 263 (*)    All other components within normal limits  APTT  PROTIME-INR  BASIC METABOLIC PANEL  LIPID PANEL  CBC  HEPARIN LEVEL  (UNFRACTIONATED)  POC URINE PREG, ED    ____________________________________________  EKG: Normal sinus rhythm, VR 77. QRS 91, QTc 434. LAE, consider biatrial enlargement. RSR'/ ________________________________________  RADIOLOGY All imaging, including plain films, CT scans, and ultrasounds, independently reviewed by me, and interpretations confirmed via formal radiology reads.  ED MD interpretation:   CXR: Clear CT Angio C/A/P: No dissection noted, new ovarian mass R ovary  CT Head: [place]  Official radiology report(s): DG Chest 2 View  Result Date: 08/31/2020 CLINICAL DATA:  Chest pain. EXAM: CHEST - 2 VIEW COMPARISON:  01/24/2020 FINDINGS: The heart size and mediastinal contours are within normal limits. Both lungs are clear. The visualized skeletal structures are unremarkable. IMPRESSION: No active  cardiopulmonary disease. Electronically Signed   By: Constance Holster M.D.   On: 08/31/2020 19:16   CT Head Wo Contrast  Result Date: 08/31/2020 CLINICAL DATA:  Cerebral hemorrhage suspected Emesis.  Episode of slurred speech. EXAM: CT HEAD WITHOUT CONTRAST TECHNIQUE: Contiguous axial images were obtained from the base of the skull through the vertex without intravenous contrast. COMPARISON:  Head CT 05/18/2016 FINDINGS: Brain: No intracranial hemorrhage, mass effect, or midline shift. No hydrocephalus. The basilar cisterns are patent. No evidence of territorial infarct or acute ischemia. No extra-axial or intracranial fluid collection. Vascular: No hyperdense vessel or unexpected calcification. Skull: Normal. Negative for fracture or focal lesion. Sinuses/Orbits: Paranasal sinuses and mastoid air cells are clear. The visualized orbits are unremarkable. Other: None. IMPRESSION: Negative noncontrast head CT. Electronically Signed   By: Keith Rake M.D.   On: 08/31/2020 20:37   CT Angio Chest/Abd/Pel for Dissection W and/or Wo Contrast  Result Date: 08/31/2020 CLINICAL DATA:  Jaw pain.   Emesis. EXAM: CT ANGIOGRAPHY CHEST, ABDOMEN AND PELVIS TECHNIQUE: Non-contrast CT of the chest was initially obtained. Multidetector CT imaging through the chest, abdomen and pelvis was performed using the standard protocol during bolus administration of intravenous contrast. Multiplanar reconstructed images and MIPs were obtained and reviewed to evaluate the vascular anatomy. CONTRAST:  1100mL OMNIPAQUE IOHEXOL 350 MG/ML SOLN COMPARISON:  CT dated 01/25/2020 FINDINGS: CTA CHEST FINDINGS Cardiovascular: There is no evidence for thoracic aortic aneurysm or dissection. There is no evidence for an acute pulmonary embolism. The heart size is unremarkable. There is no significant pericardial effusion. Mediastinum/Nodes: -- No mediastinal lymphadenopathy. -- No hilar lymphadenopathy. -- No axillary lymphadenopathy. -- No supraclavicular lymphadenopathy. -- Normal thyroid gland where visualized. -  Unremarkable esophagus. Lungs/Pleura: Airways are patent. No pleural effusion, lobar consolidation, pneumothorax or pulmonary infarction. Musculoskeletal: No chest wall abnormality. No bony spinal canal stenosis. Review of the MIP images confirms the above findings. CTA ABDOMEN AND PELVIS FINDINGS VASCULAR Aorta: Normal caliber aorta without aneurysm, dissection, vasculitis or significant stenosis. Celiac: Patent without evidence of aneurysm, dissection, vasculitis or significant stenosis. SMA: Patent without evidence of aneurysm, dissection, vasculitis or significant stenosis. Renals: Both renal arteries are patent without evidence of aneurysm, dissection, vasculitis, fibromuscular dysplasia or significant stenosis. IMA: Patent without evidence of aneurysm, dissection, vasculitis or significant stenosis. Inflow: Patent without evidence of aneurysm, dissection, vasculitis or significant stenosis. Veins: No obvious venous abnormality within the limitations of this arterial phase study. Review of the MIP images confirms the above  findings. NON-VASCULAR Hepatobiliary: The liver is normal. Normal gallbladder.There is no biliary ductal dilation. Pancreas: Normal contours without ductal dilatation. No peripancreatic fluid collection. Spleen: Unremarkable. Adrenals/Urinary Tract: --Adrenal glands: Unremarkable. --Right kidney/ureter: No hydronephrosis or radiopaque kidney stones. --Left kidney/ureter: No hydronephrosis or radiopaque kidney stones. --Urinary bladder: Unremarkable. Stomach/Bowel: --Stomach/Duodenum: No hiatal hernia or other gastric abnormality. Normal duodenal course and caliber. --Small bowel: Unremarkable. --Colon: Unremarkable. --Appendix: Not visualized. No right lower quadrant inflammation or free fluid. Lymphatic: --No retroperitoneal lymphadenopathy. --No mesenteric lymphadenopathy. --No pelvic or inguinal lymphadenopathy. Reproductive: There is a new cystic ovarian mass involving the right ovary measuring 5.2 cm (axial series 6, image 159). The left ovary is unremarkable. Other: No ascites or free air. The abdominal wall is normal. Musculoskeletal. No acute displaced fractures. Review of the MIP images confirms the above findings. IMPRESSION: 1. No acute thoracic, abdominal or pelvic pathology. Specifically, there is no evidence for an acute pulmonary embolism. 2. 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 Recommend follow-up ultrasound in 6-12 months to confirm resolution. Electronically Signed   By: Constance Holster M.D.   On: 08/31/2020 20:29    ____________________________________________  PROCEDURES   Procedure(s) performed (including Critical Care):  .Critical Care Performed by: Duffy Bruce, MD Authorized by: Duffy Bruce, MD   Critical care provider statement:    Critical care time (minutes):  35   Critical care time was exclusive of:  Separately billable procedures and treating other patients and teaching time   Critical care was necessary to treat or prevent  imminent or life-threatening deterioration of the following conditions:  Cardiac failure, circulatory failure and respiratory failure   Critical care was time spent personally by me on the following activities:  Development of treatment plan with patient or surrogate, discussions with consultants, evaluation of patient's response to treatment, examination of patient, obtaining history from patient or surrogate, ordering and performing treatments and interventions, ordering and review of laboratory studies, ordering and review of radiographic studies, pulse oximetry, re-evaluation of patient's condition and review of old charts   I assumed direction of critical care for this patient from another provider in my specialty: no      ____________________________________________  INITIAL IMPRESSION / MDM / [place] / ED COURSE  As part of my medical decision making, I reviewed the following data within the [place] notes reviewed and incorporated, Old chart reviewed, Notes from prior ED visits, and [place] Controlled Substance Database       *ANTONYA LEEDER was evaluated in Emergency Department on 09/01/2020 for the symptoms described in the history of present illness. She was evaluated in the context of the global COVID-19 pandemic, which necessitated consideration that the patient might be at risk for infection with the SARS-CoV-2 virus that causes COVID-19. Institutional protocols and algorithms that pertain to the evaluation of patients at risk for COVID-19 are in a state of rapid change based on information released by regulatory bodies including the CDC and federal and state organizations. These policies and algorithms were followed during the patient's care in the ED.  Some ED evaluations and interventions may be delayed as a result of limited staffing during the pandemic.*     Medical Decision Making:  39 yo F with h/o CAD here with CP, hypertension. DDx includes  HTN emergency vs NSTEMI. Initial EKG shows TWI in inferior and lateral precordial leads, resolved on repeat. Pt has ongoing pain despite nitro SL and morphine, will start on nitro gtt. Trop slightly uptrending to 263. No STEMI on EKG. Discussed case with Dr. Harrington Challenger of Cardiology. Will start on heparin, admit to medicine. CT angio obtained 2/2 her severe HTN with c/o CP radiating ot back, and shows no signs of dissection. No significant CAD mentioned. CT head obtained given reported HA, reviewed by me and is unremarkable.  Of note, incidental ovarian mass noted - recommend f/u u/s in 6-12 months.  ____________________________________________  FINAL CLINICAL IMPRESSION(S) / ED DIAGNOSES  Final diagnoses:  NSTEMI (non-ST elevated myocardial infarction) (HCC)  Ovarian mass     MEDICATIONS GIVEN DURING THIS VISIT:  Medications  nitroGLYCERIN (NITROSTAT) SL tablet 0.4 mg (0.4 mg Sublingual Given 08/31/20 1954)  heparin ADULT infusion 100 units/mL (25000 units/217mL sodium chloride 0.45%) (850 Units/hr Intravenous New Bag/Given 08/31/20 2230)  nitroGLYCERIN 50 mg in dextrose 5 % 250 mL (0.2 mg/mL) infusion (10 mcg/min Intravenous Rate/Dose Change 09/01/20 0042)  aspirin chewable tablet 81 mg (has no administration in time range)  metoprolol  tartrate (LOPRESSOR) tablet 25 mg (25 mg Oral Given 09/01/20 0037)  aspirin EC tablet 81 mg (has no administration in time range)  acetaminophen (TYLENOL) tablet 650 mg (has no administration in time range)  ondansetron (ZOFRAN) injection 4 mg (has no administration in time range)  0.9 %  sodium chloride infusion ( Intravenous New Bag/Given 09/01/20 0040)  atorvastatin (LIPITOR) tablet 80 mg (80 mg Oral Given 09/01/20 0035)  zolpidem (AMBIEN) tablet 5 mg (has no administration in time range)  ALPRAZolam (XANAX) tablet 0.25 mg (has no administration in time range)  morphine 2 MG/ML injection 2 mg (2 mg Intravenous Given 08/31/20 2333)  morphine 4 MG/ML injection 4 mg  (4 mg Intravenous Given 08/31/20 1956)  ondansetron (ZOFRAN) injection 4 mg (4 mg Intravenous Given 08/31/20 1955)  iohexol (OMNIPAQUE) 350 MG/ML injection 100 mL (100 mLs Intravenous Contrast Given 08/31/20 2004)  morphine 4 MG/ML injection 4 mg (4 mg Intravenous Given 08/31/20 2041)  aspirin chewable tablet 324 mg (324 mg Oral Given 08/31/20 2054)  labetalol (NORMODYNE) injection 10 mg (10 mg Intravenous Given 08/31/20 2125)  heparin bolus via infusion 4,000 Units (4,000 Units Intravenous Bolus from Bag 08/31/20 2231)     ED Discharge Orders    None       Note:  This document was prepared using Dragon voice recognition software and may include unintentional dictation errors.   Duffy Bruce, MD 09/01/20 [phone]

## 2020-08-31 NOTE — Progress Notes (Signed)
ANTICOAGULATION CONSULT NOTE - Initial Consult  Pharmacy Consult for Heparin Drip Indication: chest pain/ACS  Allergies  Allergen Reactions  . No Healthtouch Food Allergies Anaphylaxis and Other (See Comments)    Cherries  . Penicillins Anaphylaxis    Patient Measurements: Height: 5\' 3"  (160 cm) Weight: 87.5 kg (193 lb) IBW/kg (Calculated) : 52.4 Heparin Dosing Weight: 72.1 kg  Vital Signs: Temp: 97.6 F (36.4 C) (09/26 1857) Temp Source: Oral (09/26 1857) BP: 185/92 (09/26 1930) Pulse Rate: 91 (09/26 1930)  Labs: Recent Labs    08/31/20 1900 08/31/20 2056  HGB 10.0*  --   HCT 30.6*  --   PLT 249  --   CREATININE 0.73  --   TROPONINIHS 230* 263*    Estimated Creatinine Clearance: 99 mL/min (by C-G formula based on SCr of 0.73 mg/dL).   Medical History: Past Medical History:  Diagnosis Date  . Chronic hypertension   . Kidney calculi   . UTI (lower urinary tract infection)    Assessment: Patient is a 39yo female presenting with chest pain. Troponin [id number]. Pharmacy consulted for Heparin dosing. No prior anticoagulants listed in home meds.  Goal of Therapy:  Heparin level 0.3-0.7 units/ml Monitor platelets by anticoagulation protocol: Yes   Plan:  Give 4000 units bolus x 1 Start heparin infusion at 850 units/hr Check anti-Xa level in 6 hours and daily while on heparin Continue to monitor H&H and platelets  Paulina Fusi, PharmD, BCPS 08/31/2020 10:01 PM

## 2020-08-31 NOTE — ED Notes (Signed)
Patient transported to CT 

## 2020-08-31 NOTE — ED Triage Notes (Addendum)
Pt comes EMS from work with CP and jaw pain. Pt CBG 114. HTN with EMS. NSR/ST at this time. Pt had 324mg  aspirin. Pt had x1 episode of emesis. 1 spray nitro. Pt states that one customer stated she had some slurred speech about an hour ago but is now completely resolved with no neuro symptoms present.   Pt also has hx of stent placement and recent kidney stone dx.

## 2020-08-31 NOTE — ED Notes (Signed)
Date and time results received: 08/31/20 7:42 PM  Test: Troponin Critical Value: 230  Name of Provider Notified: Dr. Myrene Buddy  Orders Received? Or Actions Taken?: Acknowledged, repeat EKG

## 2020-09-01 ENCOUNTER — Other Ambulatory Visit: Payer: Self-pay

## 2020-09-01 ENCOUNTER — Encounter: Payer: Self-pay | Admitting: Family Medicine

## 2020-09-01 ENCOUNTER — Inpatient Hospital Stay (HOSPITAL_COMMUNITY)
Admit: 2020-09-01 | Discharge: 2020-09-01 | Disposition: A | Discharge status: Home / Self Care | Payer: Medicaid Other | Attending: Family Medicine | Admitting: Family Medicine

## 2020-09-01 DIAGNOSIS — I200 Unstable angina: Secondary | ICD-10-CM

## 2020-09-01 DIAGNOSIS — I340 Nonrheumatic mitral (valve) insufficiency: Secondary | ICD-10-CM

## 2020-09-01 DIAGNOSIS — E785 Hyperlipidemia, unspecified: Secondary | ICD-10-CM

## 2020-09-01 DIAGNOSIS — D62 Acute posthemorrhagic anemia: Secondary | ICD-10-CM

## 2020-09-01 DIAGNOSIS — D649 Anemia, unspecified: Secondary | ICD-10-CM

## 2020-09-01 DIAGNOSIS — E876 Hypokalemia: Secondary | ICD-10-CM

## 2020-09-01 DIAGNOSIS — I10 Essential (primary) hypertension: Secondary | ICD-10-CM

## 2020-09-01 DIAGNOSIS — I361 Nonrheumatic tricuspid (valve) insufficiency: Secondary | ICD-10-CM

## 2020-09-01 DIAGNOSIS — I371 Nonrheumatic pulmonary valve insufficiency: Secondary | ICD-10-CM

## 2020-09-01 DIAGNOSIS — K921 Melena: Secondary | ICD-10-CM

## 2020-09-01 LAB — BASIC METABOLIC PANEL
Anion gap: 7 (ref 5–15)
BUN: 14 mg/dL (ref 6–20)
CO2: 27 mmol/L (ref 22–32)
Calcium: 8.2 mg/dL — ABNORMAL LOW (ref 8.9–10.3)
Chloride: 106 mmol/L (ref 98–111)
Creatinine, Ser: 0.72 mg/dL (ref 0.44–1.00)
GFR calc Af Amer: >60 mL/min (ref >60)
GFR calc non Af Amer: >60 mL/min (ref >60)
Glucose, Bld: 106 mg/dL — ABNORMAL HIGH (ref 70–99)
Potassium: 3.4 mmol/L — ABNORMAL LOW (ref 3.5–5.1)
Sodium: 140 mmol/L (ref 135–145)

## 2020-09-01 LAB — ECHOCARDIOGRAM COMPLETE
AR max vel: 2.14 cm2
AV Area VTI: 2.1 cm2
AV Area mean vel: 1.85 cm2
AV Mean grad: 5 mmHg
AV Peak grad: 7 mmHg
Ao pk vel: 1.32 m/s
Area-P 1/2: 2.86 cm2
Height: 63 in
S' Lateral: 3.45 cm
Weight: 3097.6 oz

## 2020-09-01 LAB — MAGNESIUM: Magnesium: 2.1 mg/dL (ref 1.7–2.4)

## 2020-09-01 LAB — CBC
HCT: 28.7 % — ABNORMAL LOW (ref 36.0–46.0)
Hemoglobin: 9.3 g/dL — ABNORMAL LOW (ref 12.0–15.0)
MCH: 26.8 pg (ref 26.0–34.0)
MCHC: 32.4 g/dL (ref 30.0–36.0)
MCV: 82.7 fL (ref 80.0–100.0)
Platelets: 236 10*3/uL (ref 150–400)
RBC: 3.47 MIL/uL — ABNORMAL LOW (ref 3.87–5.11)
RDW: 15.5 % (ref 11.5–15.5)
WBC: 11.3 10*3/uL — ABNORMAL HIGH (ref 4.0–10.5)
nRBC: 0 % (ref 0.0–0.2)

## 2020-09-01 LAB — URINALYSIS, COMPLETE (UACMP) WITH MICROSCOPIC
Bilirubin Urine: NEGATIVE
Glucose, UA: NEGATIVE mg/dL
Hgb urine dipstick: NEGATIVE
Ketones, ur: NEGATIVE mg/dL
Leukocytes,Ua: NEGATIVE
Nitrite: NEGATIVE
Protein, ur: NEGATIVE mg/dL
Specific Gravity, Urine: 1.012 (ref 1.005–1.030)
pH: 6 (ref 5.0–8.0)

## 2020-09-01 LAB — RESPIRATORY PANEL BY RT PCR (FLU A&B, COVID)
Influenza A by PCR: NEGATIVE
Influenza B by PCR: NEGATIVE
SARS Coronavirus 2 by RT PCR: NEGATIVE

## 2020-09-01 LAB — LIPID PANEL
Cholesterol: 180 mg/dL (ref 0–200)
HDL: 35 mg/dL — ABNORMAL LOW (ref >40)
LDL Cholesterol: 95 mg/dL (ref 0–99)
Total CHOL/HDL Ratio: 5.1 RATIO
Triglycerides: 250 mg/dL — ABNORMAL HIGH (ref <150)
VLDL: 50 mg/dL — ABNORMAL HIGH (ref 0–40)

## 2020-09-01 LAB — HEPARIN LEVEL (UNFRACTIONATED)
Heparin Unfractionated: <0.1 IU/mL — ABNORMAL LOW (ref 0.30–0.70)
Heparin Unfractionated: <0.1 IU/mL — ABNORMAL LOW (ref 0.30–0.70)
Heparin Unfractionated: 0.14 IU/mL — ABNORMAL LOW (ref 0.30–0.70)

## 2020-09-01 LAB — IRON AND TIBC
Iron: 22 ug/dL — ABNORMAL LOW (ref 28–170)
Saturation Ratios: 6 % — ABNORMAL LOW (ref 10.4–31.8)
TIBC: 343 ug/dL (ref 250–450)
UIBC: 321 ug/dL

## 2020-09-01 LAB — VITAMIN B12: Vitamin B-12: 286 pg/mL (ref 180–914)

## 2020-09-01 LAB — RETICULOCYTES
Immature Retic Fract: 27.3 % — ABNORMAL HIGH (ref 2.3–15.9)
RBC.: 3.71 MIL/uL — ABNORMAL LOW (ref 3.87–5.11)
Retic Count, Absolute: 124.7 10*3/uL (ref 19.0–186.0)
Retic Ct Pct: 3.4 % — ABNORMAL HIGH (ref 0.4–3.1)

## 2020-09-01 LAB — FERRITIN: Ferritin: 25 ng/mL (ref 11–307)

## 2020-09-01 LAB — FOLATE: Folate: 23 ng/mL (ref >5.9)

## 2020-09-01 LAB — TROPONIN I (HIGH SENSITIVITY)
Troponin I (High Sensitivity): 331 ng/L (ref <18)
Troponin I (High Sensitivity): 306 ng/L (ref <18)
Troponin I (High Sensitivity): 247 ng/L (ref <18)

## 2020-09-01 MED ORDER — HEPARIN BOLUS VIA INFUSION
2000.0000 [IU] | Freq: Once | INTRAVENOUS | Status: AC
  Administered 2020-09-01: 2000 [IU] via INTRAVENOUS
  Filled 2020-09-01: qty 2000

## 2020-09-01 MED ORDER — INFLUENZA VAC SPLIT QUAD 0.5 ML IM SUSY: 0.5000 mL | PREFILLED_SYRINGE | INTRAMUSCULAR | Status: DC

## 2020-09-01 MED ORDER — HEPARIN BOLUS VIA INFUSION
2150.0000 [IU] | Freq: Once | INTRAVENOUS | Status: AC
  Administered 2020-09-01: 2150 [IU] via INTRAVENOUS
  Filled 2020-09-01: qty 2150

## 2020-09-01 MED ORDER — PANTOPRAZOLE SODIUM 40 MG IV SOLR
40.0000 mg | Freq: Two times a day (BID) | INTRAVENOUS | Status: DC
  Administered 2020-09-01 – 2020-09-03 (×4): 40 mg via INTRAVENOUS
  Filled 2020-09-01 (×4): qty 40

## 2020-09-01 MED ORDER — POTASSIUM CHLORIDE CRYS ER 20 MEQ PO TBCR
40.0000 meq | EXTENDED_RELEASE_TABLET | Freq: Once | ORAL | Status: AC
  Administered 2020-09-01: 40 meq via ORAL
  Filled 2020-09-01: qty 2

## 2020-09-01 MED ORDER — POTASSIUM CHLORIDE 20 MEQ PO PACK
40.0000 meq | PACK | Freq: Once | ORAL | Status: AC
  Administered 2020-09-01: 40 meq via ORAL
  Filled 2020-09-01: qty 2

## 2020-09-01 NOTE — Progress Notes (Signed)
ANTICOAGULATION CONSULT NOTE - Initial Consult  Pharmacy Consult for Heparin Drip Indication: chest pain/ACS  Allergies  Allergen Reactions  . No Healthtouch Food Allergies Anaphylaxis and Other (See Comments)    Cherries  . Penicillins Anaphylaxis    Patient Measurements: Height: 5\' 3"  (160 cm) Weight: 87.8 kg (193 lb 9.6 oz) IBW/kg (Calculated) : 52.4 Heparin Dosing Weight: 72.1 kg  Vital Signs: Temp: 98.5 F (36.9 C) (09/27 1120) Temp Source: Oral (09/27 1120) BP: 141/88 (09/27 1120) Pulse Rate: 72 (09/27 1120)  Labs: Recent Labs    08/31/20 1900 08/31/20 2056 09/01/20 0446 09/01/20 0940 09/01/20 1217 09/01/20 1413  HGB 10.0*  --  9.3*  --   --   --   HCT 30.6*  --  28.7*  --   --   --   PLT 249  --  236  --   --   --   APTT 27  --   --   --   --   --   LABPROT 12.5  --   --   --   --   --   INR 1.0  --   --   --   --   --   HEPARINUNFRC  --   --  <0.10*  --  <0.10*  --   CREATININE 0.73  --  0.72  --   --   --   TROPONINIHS 230*   < >  --  331* 306* 247*   < > = values in this interval not displayed.    Estimated Creatinine Clearance: 99.3 mL/min (by C-G formula based on SCr of 0.72 mg/dL).   Medical History: Past Medical History:  Diagnosis Date  . Chronic hypertension   . Kidney calculi   . UTI (lower urinary tract infection)    Assessment: Patient is a 39yo female presenting with chest pain with PMH significant for HTN, CAD s/p PCI and 2 stents (2018), and nephrolithiasis and UTI. Per chart review, No prior anticoagulants listed in home meds.Troponin 230>263 peaked at 331. EKG showed sinus rhythm with no significant ST or T wave changes. Chest CTA revealed no evidence of PE. Echo with LVEF 70-62%; LV diastolic consistent with Grade II diastolic dysfunction. Pharmacy consulted for Heparin dosing and monitoring.   Hgb 10>9.3 Plt 249>236  9/27 HL @1217  <0.1  Goal of Therapy:  Heparin level 0.3-0.7 units/ml Monitor platelets by anticoagulation  protocol: Yes   Plan:  HL subtherapeutic, will give 2000 unit bolus and increase the rate to 1400 units/hr Check anti-Xa level in 6 hours and daily while on heparin Continue to monitor H&H and platelets  Per cardio, it patient's Hgb remains stable with no further evidence of active bleeding, patient might receive LHC 9/28.  Sherilyn Banker, PharmD Pharmacy Resident  09/01/2020 3:04 PM

## 2020-09-01 NOTE — Hospital Course (Addendum)
Brenda Todd  is a 39 y.o. female with a history of hypertension, CAD s/p PCI x2, nephrolithiasis and UTI, presented to the ED on evening of 08/31/20 with midsternal chest pain graded 8/10 in severity radiating from her left shoulder and described as somebody sitting on her chest with associated left jaw tightness.  Associated nausea vomiting and diaphoresis.  Recent intermittent abdominal pain secondary to kidney stone actively passing, no dysuria.  Reported 2 episodes of dark/black stool in past couple days and takes ibuprofen daily.  In the ED,  BP uncontrolled 194/106 with otherwise normal vital signs.   Notable labs: troponin 230>>263, K 3.2, WBC 12.1k and mild anemia.   CTA chest ruled out PE. 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 ECG normal sinus 93 bpm without acute ischemic changes and repeat later was similar. Qtc prolonged at 555.  Started on heparin bolus>>infusion, BP treated with IV labetalol, and given morphine and SL nitro, later nitro infusion.  Admitted to hospitalist service with cardiology consulted.

## 2020-09-01 NOTE — Progress Notes (Signed)
[place] for Heparin Drip Indication: chest pain/ACS  Allergies  Allergen Reactions  . No Healthtouch Food Allergies Anaphylaxis and Other (See Comments)    Cherries  . Penicillins Anaphylaxis    Patient Measurements: Height: 5\' 3"  (160 cm) Weight: 87.8 kg (193 lb 9.6 oz) IBW/kg (Calculated) : 52.4 Heparin Dosing Weight: 72.1 kg  Vital Signs: Temp: 98.6 F (37 C) (09/27 2157) Temp Source: Oral (09/27 2157) BP: 176/98 (09/27 2157) Pulse Rate: 84 (09/27 2157)  Labs: Recent Labs    08/31/20 1900 08/31/20 2056 09/01/20 0446 09/01/20 0940 09/01/20 1217 09/01/20 1413 09/01/20 2150  HGB 10.0*  --  9.3*  --   --   --   --   HCT 30.6*  --  28.7*  --   --   --   --   PLT 249  --  236  --   --   --   --   APTT 27  --   --   --   --   --   --   LABPROT 12.5  --   --   --   --   --   --   INR 1.0  --   --   --   --   --   --   HEPARINUNFRC  --   --  <0.10*  --  <0.10*  --  0.14*  CREATININE 0.73  --  0.72  --   --   --   --   TROPONINIHS 230*   < >  --  331* 306* 247*  --    < > = values in this interval not displayed.    Estimated Creatinine Clearance: 99.3 mL/min (by C-G formula based on SCr of 0.72 mg/dL).   Medical History: Past Medical History:  Diagnosis Date  . Chronic hypertension   . Kidney calculi   . UTI (lower urinary tract infection)    Assessment: Patient is a 39yo female presenting with chest pain with PMH significant for HTN, CAD s/p PCI and 2 stents (2018), and nephrolithiasis and UTI. Per chart review, No prior anticoagulants listed in home meds.Troponin 230>263 peaked at 331. EKG showed sinus rhythm with no significant ST or T wave changes. Chest CTA revealed no evidence of PE. Echo with LVEF 25-49%; LV diastolic consistent with Grade II diastolic dysfunction. Pharmacy consulted for Heparin dosing and monitoring.   Hgb 10>9.3 Plt 249>236  9/27 1217 HL:  <0.1 9/27  2150 HL: 0.14  Goal of Therapy:  Heparin  level 0.3-0.7 units/ml Monitor platelets by anticoagulation protocol: Yes   Plan:  --HL is subtherapeutic, will give 2000 unit bolus and increase the rate to 1600 units/hr --Check anti-Xa level 6 hours after rate change  --Continue to monitor H&H and platelets  Per cardio, it patient's Hgb remains stable with no further evidence of active bleeding, patient might receive LHC 9/28.  Benita Gutter 09/01/2020 10:35 PM

## 2020-09-01 NOTE — Consult Note (Signed)
Cardiology Consultation:   Patient ID: [name] MRN: [id number]; DOB: 07-18-1981  Admit date: 08/31/2020 Date of Consult: 09/01/2020  Primary Care Provider: Patient, No Pcp Per Primary Cardiologist: [facility], Dr. Fletcher Anon Primary Electrophysiologist:  None    Patient Profile:   Brenda Todd is a 39 y.o. female with a hx of CAD s/p stenting x2 in 2018, kidney stones, hypertension, hyperlipidemia, and who is being seen today for the evaluation of chest pain and elevated troponin at the request of Dr. Arbutus Ped.  History of Present Illness:   Brenda Todd is a 39 year old female with PMH as above.  She has a history of hypertension, renal stones, and hyperlipidemia.  No FHx of cardiac disease or early cardiac death.  She has a history of PCI with stent x2 in [place] in 2018 per patient report.  At that time, she had a syncopal episode and chest pressure that brought her to the emergency department.  She underwent cardiac catheterization with 2 DES placed.  She took ASA and Brilinta for 6 months afterwards; however, she stopped Brilinta due to expense.    She moved back to the [place] area in 2021.    She reports feeling well and without any CP until 01/2020, at which time she experienced left-sided chest pain and pressure with near syncope.  She was admitted to [facility] and evaluated by [facility] cardiology / Dr. Rockey Situ.  In addition to reported significant left flank pain, she noted L shoulder and neck pain, as well as L arm and breast pain.  Cardiac enzymes were negative x2 in the emergency room and EKG without acute ST/T changes.  She was noted to be quite hypertensive at that time with BP 200/128.  Myoview was negative for significant ischemia and overall a low risk scan.  Her pain was attributed to cervical radiculopathy and she was discharged home.  At that time, she was provided with a prescription for Plavix.  She reports today that she is no longer taking this Plavix due to cost and had  adverse reaction to the medication, resulting in emesis.  She has been taking metoprolol 50 mg daily (she does not tolerate higher doses of metoprolol), ASA, and no other medications. She  has not been taking her statin due to expense.    She denies any use of alcohol.  She does report a prior history of smoking/tobacco use and quit after her 2018 stenting.  She states that she has smoked 1 cigarette since that time and continues to abstain from tobacco use.  She does live in a household with current secondhand smoke exposure, however.  She says she is working on getting that person to quit smoking.  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 She reports "odd" eating behaviors and that she does not always eat regularly or at normal eating times, due to night shift work.   After her admission in 01/2020, she reports she was in her usual state of health until this past 08/28/2020.  On Thursday, 08/28/2020, she reports noticing red/orange urine.    On Friday 9/24, she reports noticing melena.  She denies any alcohol use or heavy use of soda. No emesis but does report dry heaving as of 5PM on 9/26.  No other s/sx of bleeding reported other than the red urine above.   On 9/26 at approximately 5 PM, she experienced substernal chest pain, dizziness, shortness of breath, headache, and diaphoresis while at work and with exertion.  She reports that the CP was pressure, described as a "baby elephant sitting on [her] chest."  CP was central to left-sided and also associated with sharper pain radiating to her left arm and upper back.  She reports constant CP since that time, rated 7/10 and minimally alleviated with nitro or morphine.  CP is slightly worse with deeper breathing but not exacerbated by position.  She also reports right hand numbness along her 3rd-5th digits, which she states could be due to carpal tunnel as she does a lot of things with her hands.  She notes concern over decreased activity tolerance  and progressive shortness of breath since her initial symptoms.  In addition, she has left-sided flank and abdominal pain, attributed to a kidney stone that she is trying to pass at this time.  She states this is a different discomfort from her chest pressure.  She has had frequent kidney stones in the past.  On 08/31/2020, she presented to [facility] emergency department with continued substernal chest pressure.  Initial vitals significant for BP 194/106 and HR 94 bpm.  Labs showed hypokalemia, elevated HS Tn, leukocytosis, and anemia. Specifically, labs showed sodium 138, potassium 3.2, creatinine 0.73, BUN 17, high-sensitivity troponin 230  263  331 and still cycling, WBC 12.1, RBC 3.72, hemoglobin 10.0, hematocrit 30.6, platelets 249.  Of note, since her admission, Hgb 12.1  11.3 with hematocrit 30.6  26.8 and pt reporting ongoing dark and tarry stool and red urine.  EKG at presentation without acute ST abnormality and repolarization changes in the setting of elevated pressures.  Chest x-ray without active cardiopulmonary disease.  CTA with no acute pulmonary embolism and new cystic ovarian mass, involving the right ovary measuring 5.2 cm with recommendation for follow-up ultrasound in 6 to 12 months to confirm resolution.  Today, 09/01/2020, she continues to report constant chest pressure, rated 7/10, and with very little relief from nitro and morphine.  She reports numbness in her right hand, 3-5 digits, and SOB, as well as dizziness. She does note a history of dizziness, racing HR, and palpitations at times. She also reports a history of LEE, often worsening throughout the day and improved with leg elevation.  Heart Pathway Score:     Past Medical History:  Diagnosis Date  . Chronic hypertension   . Kidney calculi   . UTI (lower urinary tract infection)     Past Surgical History:  Procedure Laterality Date  . APPENDECTOMY     l/s. ruptured  . CESAREAN SECTION N/A 12/17/2015   Procedure: CESAREAN  SECTION;  Surgeon: Honor Loh Ward, MD;  Location: [facility] ORS;  Service: Obstetrics;  Laterality: N/A;  . LITHOTRIPSY       Home Medications:  Prior to Admission medications   Medication Sig Start Date End Date Taking? Authorizing Provider  aspirin 81 MG chewable tablet Chew 81 mg by mouth daily.   Yes [provider]  diphenhydrAMINE (BENADRYL) 25 MG tablet Take 25 mg by mouth every 6 (six) hours as needed.   Yes [provider]  ibuprofen (ADVIL) 200 MG tablet Take 200 mg by mouth every 6 (six) hours as needed.   Yes [provider]  metoprolol tartrate (LOPRESSOR) 25 MG tablet Take 25 mg by mouth 2 (two) times daily.   Yes [provider]  atorvastatin (LIPITOR) 20 MG tablet Take 1 tablet (20 mg total) by mouth daily at 6 PM. Patient not taking: Reported on 08/31/2020 01/25/20 08/31/20  Enzo Bi, MD  clopidogrel (PLAVIX)  75 MG tablet Take 75 mg by mouth daily. Patient not taking: Reported on 08/31/2020    [provider]    Inpatient Medications: Scheduled Meds: . aspirin  81 mg Oral Daily  . aspirin EC  81 mg Oral Daily  . atorvastatin  80 mg Oral Daily  . metoprolol tartrate  25 mg Oral BID  . potassium chloride  40 mEq Oral Once   Continuous Infusions: . sodium chloride 100 mL/hr at 09/01/20 0040  . heparin 1,100 Units/hr (09/01/20 0611)  . nitroGLYCERIN Stopped (09/01/20 0530)   PRN Meds: acetaminophen, ALPRAZolam, morphine injection, nitroGLYCERIN, ondansetron (ZOFRAN) IV, zolpidem  Allergies:    Allergies  Allergen Reactions  . No [employer] Food Allergies Anaphylaxis and Other (See Comments)    Cherries  . Penicillins Anaphylaxis    Social History:   Social History   Socioeconomic History  . Marital status: Divorced    Spouse name: Not on file  . Number of children: Not on file  . Years of education: Not on file  . Highest education level: Not on file  Occupational History  . Not on file  Tobacco Use  . Smoking  status: Current Every Day Smoker    Packs/day: 0.25    Types: Cigarettes  . Smokeless tobacco: Never Used  Vaping Use  . Vaping Use: Never used  Substance and Sexual Activity  . Alcohol use: No    Alcohol/week: 0.0 standard drinks  . Drug use: Yes    Types: Marijuana  . Sexual activity: Yes    Birth control/protection: Condom  Other Topics Concern  . Not on file  Social History Narrative  . Not on file   Social Determinants of Health   Financial Resource Strain:   . Difficulty of Paying Living Expenses: Not on file  Food Insecurity:   . Worried About Charity fundraiser in the Last Year: Not on file  . Ran Out of Food in the Last Year: Not on file  Transportation Needs:   . Lack of Transportation (Medical): Not on file  . Lack of Transportation (Non-Medical): Not on file  Physical Activity:   . Days of Exercise per Week: Not on file  . Minutes of Exercise per Session: Not on file  Stress:   . Feeling of Stress : Not on file  Social Connections:   . Frequency of Communication with Friends and Family: Not on file  . Frequency of Social Gatherings with Friends and Family: Not on file  . Attends Religious Services: Not on file  . Active Member of Clubs or Organizations: Not on file  . Attends Archivist Meetings: Not on file  . Marital Status: Not on file  Intimate Partner Violence:   . Fear of Current or Ex-Partner: Not on file  . Emotionally Abused: Not on file  . Physically Abused: Not on file  . Sexually Abused: Not on file    Family History:    History reviewed. No pertinent family history.  No family history of cardiac disease or early cardiac death per patient.  ROS:  Please see the history of present illness.  Review of Systems  Constitutional: Positive for malaise/fatigue.  Respiratory: Positive for shortness of breath. Negative for hemoptysis.   Cardiovascular: Positive for chest pain, palpitations and leg swelling. Negative for orthopnea.    Gastrointestinal: Positive for melena and nausea. Negative for abdominal pain, blood in stool, constipation, diarrhea and vomiting.       Dry heaving  Genitourinary:  Positive for flank pain. Negative for hematuria.  Musculoskeletal: Negative for falls.  Neurological: Positive for dizziness, tingling and headaches. Negative for focal weakness and loss of consciousness.  All other systems reviewed and are negative.   All other ROS reviewed and negative.     Physical Exam/Data:   Vitals:   09/01/20 0804 09/01/20 0807 09/01/20 0820 09/01/20 0825  BP:  (!) 169/95 (!) 148/91 (!) 148/83  Pulse:  84 79 74  Resp:  18    Temp:  99.2 F (37.3 C)    TempSrc:  Oral    SpO2:  100%    Weight: 87.8 kg     Height: 5\' 3"  (1.6 m)       Intake/Output Summary (Last 24 hours) at 09/01/2020 0856 Last data filed at 09/01/2020 0331 Gross per 24 hour  Intake 9.29 ml  Output --  Net 9.29 ml   Last 3 Weights 09/01/2020 08/31/2020 01/24/2020  Weight (lbs) 193 lb 9.6 oz 193 lb 193 lb  Weight (kg) 87.816 kg 87.544 kg 87.544 kg     Body mass index is 34.29 kg/m.  General:  Well nourished, well developed, in no acute distress HEENT: normal Neck: no JVD Vascular: No carotid bruits; radial pulses 2+ bilaterally Cardiac:  normal S1, S2; RRR; no murmur Lungs:  clear to auscultation bilaterally, no wheezing, rhonchi or rales  Abd: soft, nontender, no hepatomegaly  Ext: no edema Musculoskeletal:  No deformities, BUE and BLE strength normal and equal Skin: warm and dry  Neuro:  No focal abnormalities noted Psych:  Normal affect   EKG:  The EKG was personally reviewed and demonstrates:  As of Thursday EKG NSR, 93 bpm, consider LAE, T wave inversion noted in 2, 3, aVF, V5, V6 and nonspecific ST/T changes likely 2/2 repolarization abnormalities Telemetry:  Telemetry was personally reviewed and demonstrates:  NSR, 70s-80s   Relevant CV Studies:  08/31/20 Echo pending  CTA IMPRESSION: 1. No acute thoracic,  abdominal or pelvic pathology. Specifically, there is no evidence for an acute pulmonary embolism. 2. 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 Recommend follow-up ultrasound in 6-12 months to confirm resolution.   NM Study 01/25/20 Pharmacological myocardial perfusion imaging study with no significant  ischemia Normal wall motion, EF estimated at 58% No EKG changes concerning for ischemia at peak stress or in recovery. Low risk scan Signed, Esmond Plants, MD, Ph.D [facility]  Laboratory Data:  High Sensitivity Troponin:   Recent Labs  Lab 08/31/20 1900 08/31/20 2056  TROPONINIHS 230* 263*     Cardiac EnzymesNo results for input(s): TROPONINI in the last 168 hours. No results for input(s): TROPIPOC in the last 168 hours.  Chemistry Recent Labs  Lab 08/31/20 1900 09/01/20 0446  NA 138 140  K 3.2* 3.4*  CL 105 106  CO2 24 27  GLUCOSE 101* 106*  BUN 17 14  CREATININE 0.73 0.72  CALCIUM 8.6* 8.2*  GFRNONAA >60 >60  GFRAA >60 >60  ANIONGAP 9 7    No results for input(s): PROT, ALBUMIN, AST, ALT, ALKPHOS, BILITOT in the last 168 hours. Hematology Recent Labs  Lab 08/31/20 1900 09/01/20 0446  WBC 12.1* 11.3*  RBC 3.72* 3.47*  HGB 10.0* 9.3*  HCT 30.6* 28.7*  MCV 82.3 82.7  MCH 26.9 26.8  MCHC 32.7 32.4  RDW 15.4 15.5  PLT 249 236   BNPNo results for input(s): BNP, PROBNP in the last 168 hours.  DDimer No results for input(s): DDIMER in  the last 168 hours.   Radiology/Studies:  DG Chest 2 View  Result Date: 08/31/2020 CLINICAL DATA:  Chest pain. EXAM: CHEST - 2 VIEW COMPARISON:  01/24/2020 FINDINGS: The heart size and mediastinal contours are within normal limits. Both lungs are clear. The visualized skeletal structures are unremarkable. IMPRESSION: No active cardiopulmonary disease. Electronically Signed   By: Constance Holster M.D.   On: 08/31/2020 19:16   CT Head Wo Contrast  Result Date: 08/31/2020 CLINICAL DATA:  Cerebral  hemorrhage suspected Emesis.  Episode of slurred speech. EXAM: CT HEAD WITHOUT CONTRAST TECHNIQUE: Contiguous axial images were obtained from the base of the skull through the vertex without intravenous contrast. COMPARISON:  Head CT 05/18/2016 FINDINGS: Brain: No intracranial hemorrhage, mass effect, or midline shift. No hydrocephalus. The basilar cisterns are patent. No evidence of territorial infarct or acute ischemia. No extra-axial or intracranial fluid collection. Vascular: No hyperdense vessel or unexpected calcification. Skull: Normal. Negative for fracture or focal lesion. Sinuses/Orbits: Paranasal sinuses and mastoid air cells are clear. The visualized orbits are unremarkable. Other: None. IMPRESSION: Negative noncontrast head CT. Electronically Signed   By: Keith Rake M.D.   On: 08/31/2020 20:37   CT Angio Chest/Abd/Pel for Dissection W and/or Wo Contrast  Result Date: 08/31/2020 CLINICAL DATA:  Jaw pain.  Emesis. EXAM: CT ANGIOGRAPHY CHEST, ABDOMEN AND PELVIS TECHNIQUE: Non-contrast CT of the chest was initially obtained. Multidetector CT imaging through the chest, abdomen and pelvis was performed using the standard protocol during bolus administration of intravenous contrast. Multiplanar reconstructed images and MIPs were obtained and reviewed to evaluate the vascular anatomy. CONTRAST:  161mL OMNIPAQUE IOHEXOL 350 MG/ML SOLN COMPARISON:  CT dated 01/25/2020 FINDINGS: CTA CHEST FINDINGS Cardiovascular: There is no evidence for thoracic aortic aneurysm or dissection. There is no evidence for an acute pulmonary embolism. The heart size is unremarkable. There is no significant pericardial effusion. Mediastinum/Nodes: -- No mediastinal lymphadenopathy. -- No hilar lymphadenopathy. -- No axillary lymphadenopathy. -- No supraclavicular lymphadenopathy. -- Normal thyroid gland where visualized. -  Unremarkable esophagus. Lungs/Pleura: Airways are patent. No pleural effusion, lobar consolidation,  pneumothorax or pulmonary infarction. Musculoskeletal: No chest wall abnormality. No bony spinal canal stenosis. Review of the MIP images confirms the above findings. CTA ABDOMEN AND PELVIS FINDINGS VASCULAR Aorta: Normal caliber aorta without aneurysm, dissection, vasculitis or significant stenosis. Celiac: Patent without evidence of aneurysm, dissection, vasculitis or significant stenosis. SMA: Patent without evidence of aneurysm, dissection, vasculitis or significant stenosis. Renals: Both renal arteries are patent without evidence of aneurysm, dissection, vasculitis, fibromuscular dysplasia or significant stenosis. IMA: Patent without evidence of aneurysm, dissection, vasculitis or significant stenosis. Inflow: Patent without evidence of aneurysm, dissection, vasculitis or significant stenosis. Veins: No obvious venous abnormality within the limitations of this arterial phase study. Review of the MIP images confirms the above findings. NON-VASCULAR Hepatobiliary: The liver is normal. Normal gallbladder.There is no biliary ductal dilation. Pancreas: Normal contours without ductal dilatation. No peripancreatic fluid collection. Spleen: Unremarkable. Adrenals/Urinary Tract: --Adrenal glands: Unremarkable. --Right kidney/ureter: No hydronephrosis or radiopaque kidney stones. --Left kidney/ureter: No hydronephrosis or radiopaque kidney stones. --Urinary bladder: Unremarkable. Stomach/Bowel: --Stomach/Duodenum: No hiatal hernia or other gastric abnormality. Normal duodenal course and caliber. --Small bowel: Unremarkable. --Colon: Unremarkable. --Appendix: Not visualized. No right lower quadrant inflammation or free fluid. Lymphatic: --No retroperitoneal lymphadenopathy. --No mesenteric lymphadenopathy. --No pelvic or inguinal lymphadenopathy. Reproductive: There is a new cystic ovarian mass involving the right ovary measuring 5.2 cm (axial series 6, image 159). The left ovary is unremarkable. Other: No  ascites or free  air. The abdominal wall is normal. Musculoskeletal. No acute displaced fractures. Review of the MIP images confirms the above findings. IMPRESSION: 1. No acute thoracic, abdominal or pelvic pathology. Specifically, there is no evidence for an acute pulmonary embolism. 2. 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 Recommend follow-up ultrasound in 6-12 months to confirm resolution. Electronically Signed   By: Constance Holster M.D.   On: 08/31/2020 20:29    Assessment and Plan:   Atypical Chest pain with elevated HS Tn History of reported CAD and PCI (2018) --Reports ongoing CP, rated 7/10 and associated dizziness and shortness of breath.  In addition, she reports melena.  She states she is also currently passing kidney stones at this time.  Previous 01/2020 MPI low risk and without ischemia. As above, she does have a reported history of PCI x2 in [place] in 2018. These records are not available on review of EMR.  --High-sensitivity troponin 230  263  331. EKG with repolarization abnormalities but no acute ST/T changes.  --CP is somewhat atypical given it is constant and also worse with deeper breathing. Consider multifactorial etiology of CP in the setting of ovarian cyst, hematuria, ?renal stone, melena, and elevated BP. Given her comorbid anemia / comorbid conditions, including elevated BP, considered is HS Tn due to supply demand ischemia. Also considered is ongoing cervical radiculopathy given her finger numbness. Consider consulting GI given melena +/- urology given report of red urine. As below, recommend further workup of anemia.  --Cannot completely rule out cardiac etiology at this time given risk factors for cardiac ischemia, which include history of smoking, hyperlipidemia, and previous history of cardiac disease and reported stenting.  She reports some symptoms are similar to that before her 2018 stenting. --Continue to cycle high-sensitivity troponin until peaked  and down-trending.  --Echo obtained and pending official reading.  If EF reduced or WMA / acute changes, she will need further ischemic workup.  --At this time, however, she is not an ideal candidate for cardiac catheterization until further anemia workup. If PCI was to be performed, we would want to ensure she could be on antiplatelet therapy without worsening anemia.  --Discussed anemia with internal medicine. They will workup her anemia.  --Continue medical management with statin and beta-blocker.  Recommend hold ASA and consider holding heparin if further drops in hemoglobin and pending further anemia workup. SL nitro as needed. Case management / financial barriers should be reviewed before discharge.   ?Anemia ?Kidney stones, ?Hematuria, ?Melena --See above.  Discussed with internal medicine.  Recommend further workup of underlying anemia at this time. Consider GI /urology consult, given her red urine/melena.  Consider FOBT.  As above, we will want to ensure that she does not have any risk of blood loss anemia on antiplatelet therapy.  Continue to monitor anemia closely on current heparin.  Recommend low threshold to discontinue heparin with ongoing or worsening anemia.  Transfuse for hemoglobin below 8.0.  Hypokalemia --Replete with goal 4.0.  Check magnesium.  Daily BMET.  Hypertension --Consider as contributing to her CP and symptoms as above.  Recommend continue Lopressor 25 mg twice daily and consider addition of ACE inhibitor / ARB before discharge if tolerated by renal function and financially feasible for her.  Goal BP 130/80 or lower.  HLD, hypertriglyceridemia --Will need to discuss financial barriers to medical therapy with case manager prior to discharge.  In the past, she has been unable to afford her statin.  9/27 LDL 95 with total cholesterol 180 and triglycerides 250.  Ovarian mass --As above, she will also need follow-up ultrasound of her cystic mass in 6-12 months as an  outpatient. Consider that this is likely also contributing to her presentation.    For questions or updates, please contact [place] Please consult [email or web address] for contact info under     Signed, Arvil Chaco, PA-C  09/01/2020 8:56 AM

## 2020-09-01 NOTE — Consult Note (Signed)
Brenda Antigua, MD [address], [place], [place], [place], [address] [address], [place], [place], [place], [address] Phone: [phone]  Fax: [phone]  Consultation  Referring Provider:     Dr. Arbutus Ped Primary Care Physician:  Patient, No Pcp Per Reason for Consultation:     Anemia  Date of Admission:  08/31/2020 Date of Consultation:  09/01/2020         HPI:   Brenda Todd is a 39 y.o. female with history of CAD status post stenting x2 in 2018, previously on Plavix, and has not been on it recently, admitted with chest pain, with GI consulted for anemia.    Patient also reports 2 episodes of melena starting 3 days ago. Last episode was 2 days ago. No prior history of the same. Daily 800mg  ibuprofen use for 1 month. No emesis.  No hematochezia.  No prior endoscopy.  No family history of colon cancer.  Substernal chest pain starting 1 day ago associated with headache, dizziness, shortness of breath and diaphoresis.  Troponin elevated.  Past Medical History:  Diagnosis Date  . Chronic hypertension   . Kidney calculi   . UTI (lower urinary tract infection)     Past Surgical History:  Procedure Laterality Date  . APPENDECTOMY     l/s. ruptured  . CESAREAN SECTION Todd/A 12/17/2015   Procedure: CESAREAN SECTION;  Surgeon: Honor Loh Ward, MD;  Location: [facility] ORS;  Service: Obstetrics;  Laterality: Todd/A;  . LITHOTRIPSY      Prior to Admission medications   Medication Sig Start Date End Date Taking? Authorizing Provider  aspirin 81 MG chewable tablet Chew 81 mg by mouth daily.   Yes [provider]  diphenhydrAMINE (BENADRYL) 25 MG tablet Take 25 mg by mouth every 6 (six) hours as needed.   Yes [provider]  ibuprofen (ADVIL) 200 MG tablet Take 200 mg by mouth every 6 (six) hours as needed.   Yes [provider]  metoprolol tartrate (LOPRESSOR) 25 MG tablet Take 25 mg by mouth 2 (two) times daily.   Yes [provider]    atorvastatin (LIPITOR) 20 MG tablet Take 1 tablet (20 mg total) by mouth daily at 6 PM. Patient not taking: Reported on 08/31/2020 01/25/20 08/31/20  Enzo Bi, MD  clopidogrel (PLAVIX) 75 MG tablet Take 75 mg by mouth daily. Patient not taking: Reported on 08/31/2020    [provider]    History reviewed. No pertinent family history.   Social History   Tobacco Use  . Smoking status: Current Every Day Smoker    Packs/day: 0.25    Types: Cigarettes  . Smokeless tobacco: Never Used  Vaping Use  . Vaping Use: Never used  Substance Use Topics  . Alcohol use: No    Alcohol/week: 0.0 standard drinks  . Drug use: Yes    Types: Marijuana    Allergies as of 08/31/2020 - Review Complete 08/31/2020  Allergen Reaction Noted  . No healthtouch food allergies Anaphylaxis and Other (See Comments) 08/31/2020  . Penicillins Anaphylaxis 04/05/2012    Review of Systems:    All systems reviewed and negative except where noted in HPI.   Physical Exam:  Vital signs in last 24 hours: Vitals:   09/01/20 0820 09/01/20 0825 09/01/20 0830 09/01/20 1120  BP: (!) 148/91 (!) 148/83 (!) 146/85 (!) 141/88  Pulse: 79 74 75 72  Resp:    18  Temp:    98.5 F (36.9 C)  TempSrc:  Oral  SpO2:    97%  Weight:      Height:         General:   Pleasant, cooperative in NAD Head:  Normocephalic and atraumatic. Eyes:   No icterus.   Conjunctiva pink. PERRLA. Ears:  Normal auditory acuity. Neck:  Supple; no masses or thyroidomegaly Lungs: Respirations even and unlabored. Lungs clear to auscultation bilaterally.   No wheezes, crackles, or rhonchi.  Abdomen:  Soft, nondistended, nontender. Normal bowel sounds. No appreciable masses or hepatomegaly.  No rebound or guarding.  Neurologic:  Alert and oriented x3;  grossly normal neurologically. Skin:  Intact without significant lesions or rashes. Cervical Nodes:  No significant cervical adenopathy. Psych:  Alert and cooperative. Normal affect.  LAB  RESULTS: Recent Labs    08/31/20 1900 09/01/20 0446  WBC 12.1* 11.3*  HGB 10.0* 9.3*  HCT 30.6* 28.7*  PLT 249 236   BMET Recent Labs    08/31/20 1900 09/01/20 0446  NA 138 140  K 3.2* 3.4*  CL 105 106  CO2 24 27  GLUCOSE 101* 106*  BUN 17 14  CREATININE 0.73 0.72  CALCIUM 8.6* 8.2*   LFT No results for input(s): PROT, ALBUMIN, AST, ALT, ALKPHOS, BILITOT, BILIDIR, IBILI in the last 72 hours. PT/INR Recent Labs    08/31/20 1900  LABPROT 12.5  INR 1.0    STUDIES: DG Chest 2 View  Result Date: 08/31/2020 CLINICAL DATA:  Chest pain. EXAM: CHEST - 2 VIEW COMPARISON:  01/24/2020 FINDINGS: The heart size and mediastinal contours are within normal limits. Both lungs are clear. The visualized skeletal structures are unremarkable. IMPRESSION: No active cardiopulmonary disease. Electronically Signed   By: Constance Holster M.D.   On: 08/31/2020 19:16   CT Head Wo Contrast  Result Date: 08/31/2020 CLINICAL DATA:  Cerebral hemorrhage suspected Emesis.  Episode of slurred speech. EXAM: CT HEAD WITHOUT CONTRAST TECHNIQUE: Contiguous axial images were obtained from the base of the skull through the vertex without intravenous contrast. COMPARISON:  Head CT 05/18/2016 FINDINGS: Brain: No intracranial hemorrhage, mass effect, or midline shift. No hydrocephalus. The basilar cisterns are patent. No evidence of territorial infarct or acute ischemia. No extra-axial or intracranial fluid collection. Vascular: No hyperdense vessel or unexpected calcification. Skull: Normal. Negative for fracture or focal lesion. Sinuses/Orbits: Paranasal sinuses and mastoid air cells are clear. The visualized orbits are unremarkable. Other: None. IMPRESSION: Negative noncontrast head CT. Electronically Signed   By: Keith Rake M.D.   On: 08/31/2020 20:37   ECHOCARDIOGRAM COMPLETE  Result Date: 09/01/2020    ECHOCARDIOGRAM REPORT   Patient Name:   Brenda BABIN Date of Exam: 09/01/2020 Medical Rec #:   [id number]       Height:       63.0 in Accession #:    [id number]      Weight:       193.0 lb Date of Birth:  06-16-1981        BSA:          1.905 m Patient Age:    73 years        BP:           127/68 mmHg Patient Gender: F               HR:           92 bpm. Exam Location:  [facility] Procedure: 2D Echo Indications:     NSTEMI I21.4  History:  Patient has no prior history of Echocardiogram examinations.                  Previous Myocardial Infarction; Risk Factors:Hypertension.  Sonographer:     L Thornton-Maynard Referring Phys:  [id number] [place] Diagnosing Phys: Kathlyn Sacramento MD IMPRESSIONS  1. Left ventricular ejection fraction, by estimation, is 55 to 60%. The left ventricle has normal function. The left ventricle has no regional wall motion abnormalities. There is moderate left ventricular hypertrophy. Left ventricular diastolic parameters are consistent with Grade II diastolic dysfunction (pseudonormalization).  2. Right ventricular systolic function is normal. The right ventricular size is normal. There is mildly elevated pulmonary artery systolic pressure.  3. Left atrial size was moderately dilated.  4. The mitral valve is normal in structure. Mild mitral valve regurgitation. No evidence of mitral stenosis.  5. The aortic valve is normal in structure. Aortic valve regurgitation is not visualized. No aortic stenosis is present.  6. The inferior vena cava is normal in size with <50% respiratory variability, suggesting right atrial pressure of 8 mmHg. FINDINGS  Left Ventricle: Left ventricular ejection fraction, by estimation, is 55 to 60%. The left ventricle has normal function. The left ventricle has no regional wall motion abnormalities. The left ventricular internal cavity size was normal in size. There is  moderate left ventricular hypertrophy. Left ventricular diastolic parameters are consistent with Grade II diastolic dysfunction (pseudonormalization). Right Ventricle: The right ventricular size  is normal. No increase in right ventricular wall thickness. Right ventricular systolic function is normal. There is mildly elevated pulmonary artery systolic pressure. The tricuspid regurgitant velocity is 2.99  m/s, and with an assumed right atrial pressure of 8 mmHg, the estimated right ventricular systolic pressure is 79.8 mmHg. Left Atrium: Left atrial size was moderately dilated. Right Atrium: Right atrial size was normal in size. Pericardium: There is no evidence of pericardial effusion. Mitral Valve: The mitral valve is normal in structure. Mild mitral valve regurgitation. No evidence of mitral valve stenosis. Tricuspid Valve: The tricuspid valve is normal in structure. Tricuspid valve regurgitation is mild . No evidence of tricuspid stenosis. Aortic Valve: The aortic valve is normal in structure. Aortic valve regurgitation is not visualized. No aortic stenosis is present. Aortic valve mean gradient measures 5.0 mmHg. Aortic valve peak gradient measures 7.0 mmHg. Aortic valve area, by VTI measures 2.10 cm. Pulmonic Valve: The pulmonic valve was normal in structure. Pulmonic valve regurgitation is mild. No evidence of pulmonic stenosis. Aorta: The aortic root is normal in size and structure. Venous: The inferior vena cava is normal in size with less than 50% respiratory variability, suggesting right atrial pressure of 8 mmHg. IAS/Shunts: No atrial level shunt detected by color flow Doppler.  LEFT VENTRICLE PLAX 2D LVIDd:         4.82 cm  Diastology LVIDs:         3.45 cm  LV e' medial:    7.94 cm/s LV PW:         1.35 cm  LV E/e' medial:  14.4 LV IVS:        1.52 cm  LV e' lateral:   7.18 cm/s LVOT diam:     1.80 cm  LV E/e' lateral: 15.9 LV SV:         53 LV SV Index:   28 LVOT Area:     2.54 cm  RIGHT VENTRICLE RV S prime:     10.60 cm/s TAPSE (M-mode): 2.8 cm LEFT ATRIUM  Index LA diam:        3.20 cm 1.68 cm/m LA Vol (A2C):   86.0 ml 45.15 ml/m LA Vol (A4C):   93.9 ml 49.30 ml/m LA Biplane  Vol: 89.3 ml 46.89 ml/m  AORTIC VALVE                    PULMONIC VALVE AV Area (Vmax):    2.14 cm     PV Vmax:       0.99 m/s AV Area (Vmean):   1.85 cm     PV Peak grad:  3.9 mmHg AV Area (VTI):     2.10 cm AV Vmax:           132.00 cm/s AV Vmean:          104.000 cm/s AV VTI:            0.251 m AV Peak Grad:      7.0 mmHg AV Mean Grad:      5.0 mmHg LVOT Vmax:         111.00 cm/s LVOT Vmean:        75.600 cm/s LVOT VTI:          0.207 m LVOT/AV VTI ratio: 0.82  AORTA Ao Root diam: 3.00 cm MITRAL VALVE                TRICUSPID VALVE MV Area (PHT): 2.86 cm     TR Peak grad:   35.8 mmHg MV E velocity: 114.00 cm/s  TR Vmax:        299.00 cm/s MV A velocity: 49.00 cm/s MV E/A ratio:  2.33         SHUNTS                             Systemic VTI:  0.21 m                             Systemic Diam: 1.80 cm Kathlyn Sacramento MD Electronically signed by Kathlyn Sacramento MD Signature Date/Time: 09/01/2020/12:02:56 PM    Final    CT Angio Chest/Abd/Pel for Dissection W and/or Wo Contrast  Result Date: 08/31/2020 CLINICAL DATA:  Jaw pain.  Emesis. EXAM: CT ANGIOGRAPHY CHEST, ABDOMEN AND PELVIS TECHNIQUE: Non-contrast CT of the chest was initially obtained. Multidetector CT imaging through the chest, abdomen and pelvis was performed using the standard protocol during bolus administration of intravenous contrast. Multiplanar reconstructed images and MIPs were obtained and reviewed to evaluate the vascular anatomy. CONTRAST:  147mL OMNIPAQUE IOHEXOL 350 MG/ML SOLN COMPARISON:  CT dated 01/25/2020 FINDINGS: CTA CHEST FINDINGS Cardiovascular: There is no evidence for thoracic aortic aneurysm or dissection. There is no evidence for an acute pulmonary embolism. The heart size is unremarkable. There is no significant pericardial effusion. Mediastinum/Nodes: -- No mediastinal lymphadenopathy. -- No hilar lymphadenopathy. -- No axillary lymphadenopathy. -- No supraclavicular lymphadenopathy. -- Normal thyroid gland where visualized.  -  Unremarkable esophagus. Lungs/Pleura: Airways are patent. No pleural effusion, lobar consolidation, pneumothorax or pulmonary infarction. Musculoskeletal: No chest wall abnormality. No bony spinal canal stenosis. Review of the MIP images confirms the above findings. CTA ABDOMEN AND PELVIS FINDINGS VASCULAR Aorta: Normal caliber aorta without aneurysm, dissection, vasculitis or significant stenosis. Celiac: Patent without evidence of aneurysm, dissection, vasculitis or significant stenosis. SMA: Patent without evidence of aneurysm, dissection, vasculitis or significant stenosis. Renals: Both renal arteries are  patent without evidence of aneurysm, dissection, vasculitis, fibromuscular dysplasia or significant stenosis. IMA: Patent without evidence of aneurysm, dissection, vasculitis or significant stenosis. Inflow: Patent without evidence of aneurysm, dissection, vasculitis or significant stenosis. Veins: No obvious venous abnormality within the limitations of this arterial phase study. Review of the MIP images confirms the above findings. NON-VASCULAR Hepatobiliary: The liver is normal. Normal gallbladder.There is no biliary ductal dilation. Pancreas: Normal contours without ductal dilatation. No peripancreatic fluid collection. Spleen: Unremarkable. Adrenals/Urinary Tract: --Adrenal glands: Unremarkable. --Right kidney/ureter: No hydronephrosis or radiopaque kidney stones. --Left kidney/ureter: No hydronephrosis or radiopaque kidney stones. --Urinary bladder: Unremarkable. Stomach/Bowel: --Stomach/Duodenum: No hiatal hernia or other gastric abnormality. Normal duodenal course and caliber. --Small bowel: Unremarkable. --Colon: Unremarkable. --Appendix: Not visualized. No right lower quadrant inflammation or free fluid. Lymphatic: --No retroperitoneal lymphadenopathy. --No mesenteric lymphadenopathy. --No pelvic or inguinal lymphadenopathy. Reproductive: There is a new cystic ovarian mass involving the right ovary  measuring 5.2 cm (axial series 6, image 159). The left ovary is unremarkable. Other: No ascites or free air. The abdominal wall is normal. Musculoskeletal. No acute displaced fractures. Review of the MIP images confirms the above findings. IMPRESSION: 1. No acute thoracic, abdominal or pelvic pathology. Specifically, there is no evidence for an acute pulmonary embolism. 2. 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 Recommend follow-up ultrasound in 6-12 months to confirm resolution. Electronically Signed   By: Constance Holster M.D.   On: 08/31/2020 20:29      Impression / Plan:   Brenda Todd is a 39 y.o. y/o female with history of CAD status post PCI in 2019, admitted with chest pain and high troponin with GI consulted for melena and anemia  Pt possibly had NSAID induced gastric or duodenal ulcer leading to her melena and anemia  She has not had any further melena and active bleeding in 2 days  Her troponin is elevated and cardiology is planning catheterization tomorrow, as long as patient does not have any further signs of active GI bleeding  Given elevated troponin, unstable angina, endoscopic procedures at this time will be high risk.  However, if active GI bleeding occurs, benefits of procedure may outweigh risks at that time and this will need to be reassessed as well.  If active GI bleeding occurs, will need to speak to anesthesia to see if they can provide sedation in the setting, will need cardiac clearance at that point as well  PPI IV twice daily  Continue serial CBCs and transfuse PRN Avoid NSAIDs Maintain 2 large-bore IV lines Please page GI with any acute hemodynamic changes, or signs of active GI bleeding  Patient will need an upper endoscopy in the near future even after this acute medical hospitalization given this episode of anemia and melena  Avoid NSAID use such as Ibuprofen, Aleeve, advil, motrin, BC and Goodie powder, Naproxen, Meloxicam and  others.   Possible NSAID induced ulcer bleed seems to have resolved at this time given that patient has not had any further bowel movement, is hemodynamically stable, no active GI bleeding since presentation and for at least 2 days as per patient history.  Therefore, can be medically managed at this time  If cardiology requires upper endoscopy prior to any cardiac interventions, will need to discuss risks and benefits of endoscopy in detail again with the patient and anesthesia prior to proceeding  GI will follow along with you  Thank you for involving me in the care of this patient.  LOS: 1 day   Virgel Manifold, MD  09/01/2020, 1:05 PM

## 2020-09-01 NOTE — H&P (Addendum)
[place]   PATIENT NAME: Rogina Schiano    MR#:  [id number]  DATE OF BIRTH:  09-Jul-1981  DATE OF ADMISSION:  08/31/2020  PRIMARY CARE PHYSICIAN: Patient, No Pcp Per   REQUESTING/REFERRING PHYSICIAN: Duffy Bruce, MD  CHIEF COMPLAINT:   Chief Complaint  Patient presents with  . Chest Pain    HISTORY OF PRESENT ILLNESS:  Talor Desrosiers  is a 39 y.o. Caucasian female with a known history of hypertension, coronary artery disease status post PCI and 2 stents, nephrolithiasis and UTI, presented to the emergency room with acute onset of midsternal chest pain graded 8/10 in severity radiating from her left shoulder and described as somebody sitting on her chest with associated left jaw tightness.  She has been having associated nausea vomiting and diaphoresis.  She denied any bleeding diathesis.  She has been having occasional abdominal discomfort trying to pass a renal stone recently.  She admitted to headache without dizziness or blurred vision.  No fever or chills.  No cough or wheezing or hemoptysis.  No dysuria, oliguria or hematuria or flank pain.  Upon presentation to the emergency room, blood pressure was 194/106 with otherwise normal vital signs.  Labs were remarkable for a troponin I of 230 and later 263, hypokalemia of 3.2 and CBC showed leukocytosis of 12.1 with anemia.  Urine pregnancy test was negative.Chest CTA revealed no evidence for PE and CTA of the abdomen pelvis showed no acute pathology.  It showed though a new cystic ovarian mass involving the right ovary measuring 5.2 cm with recommendation for follow-up ultrasound in 6 to 12 months to confirm resolution.  Noncontrasted head CT scan revealed no acute intracranial abnormalities.  Initial EKG showed normal sinus rhythm with rate of 93 with biatrial enlargement and borderline repolarization abnormality.  Repeat EKG showed similar findings with a rate of 88 and 30 EKG with a rate of 77 without significant changes.  The  patient was given, IV heparin bolus and drip, 10 g of IV labetalol, 4 mg IV morphine sulfate and sublingual nitroglycerin followed by a nitroglycerin drip.  She will be admitted to a progressive unit bed for further evaluation and management.  PAST MEDICAL HISTORY:   Past Medical History:  Diagnosis Date  . Chronic hypertension   . Kidney calculi   . UTI (lower urinary tract infection)   Coronary artery disease status post MI status post PCI and 2 stents. Tobacco abuse -History of preeclampsia -Hypertension -Pyelonephritis - PAST SURGICAL HISTORY:   Past Surgical History:  Procedure Laterality Date  . APPENDECTOMY     l/s. ruptured  . CESAREAN SECTION N/A 12/17/2015   Procedure: CESAREAN SECTION;  Surgeon: Honor Loh Ward, MD;  Location: [facility] ORS;  Service: Obstetrics;  Laterality: N/A;  . LITHOTRIPSY      SOCIAL HISTORY:   Social History   Tobacco Use  . Smoking status: Current Every Day Smoker    Packs/day: 0.25    Types: Cigarettes  . Smokeless tobacco: Never Used  Substance Use Topics  . Alcohol use: No    Alcohol/week: 0.0 standard drinks    FAMILY HISTORY:  Positive for cancer.  DRUG ALLERGIES:   Allergies  Allergen Reactions  . No [employer] Food Allergies Anaphylaxis and Other (See Comments)    Cherries  . Penicillins Anaphylaxis    REVIEW OF SYSTEMS:   As per history of present illness. All pertinent systems were reviewed above. Constitutional, HEENT, cardiovascular, respiratory, GI, GU, musculoskeletal, neuro, psychiatric, endocrine,  integumentary and hematologic systems were reviewed and are otherwise negative/unremarkable except for positive findings mentioned above in the HPI.   MEDICATIONS AT HOME:   Prior to Admission medications   Medication Sig Start Date End Date Taking? Authorizing Provider  aspirin 81 MG chewable tablet Chew 81 mg by mouth daily.   Yes [provider]  diphenhydrAMINE (BENADRYL) 25 MG tablet Take 25 mg by mouth  every 6 (six) hours as needed.   Yes [provider]  ibuprofen (ADVIL) 200 MG tablet Take 200 mg by mouth every 6 (six) hours as needed.   Yes [provider]  metoprolol tartrate (LOPRESSOR) 25 MG tablet Take 25 mg by mouth 2 (two) times daily.   Yes [provider]  atorvastatin (LIPITOR) 20 MG tablet Take 1 tablet (20 mg total) by mouth daily at 6 PM. Patient not taking: Reported on 08/31/2020 01/25/20 08/31/20  Enzo Bi, MD  clopidogrel (PLAVIX) 75 MG tablet Take 75 mg by mouth daily. Patient not taking: Reported on 08/31/2020    [provider]      VITAL SIGNS:  Blood pressure (!) 185/92, pulse 91, temperature 97.6 F (36.4 C), temperature source Oral, resp. rate 18, height 5\' 3"  (1.6 m), weight 87.5 kg, last menstrual period 08/18/2020, SpO2 100 %.  PHYSICAL EXAMINATION:  Physical Exam  GENERAL:  39 y.o.-year-old Caucasian female patient lying in the bed with no acute distress.  EYES: Pupils equal, round, reactive to light and accommodation. No scleral icterus. Extraocular muscles intact.  HEENT: Head atraumatic, normocephalic. Oropharynx and nasopharynx clear.  NECK:  Supple, no jugular venous distention. No thyroid enlargement, no tenderness.  LUNGS: Normal breath sounds bilaterally, no wheezing, rales,rhonchi or crepitation. No use of accessory muscles of respiration.  CARDIOVASCULAR: Regular rate and rhythm, S1, S2 normal. No murmurs, rubs, or gallops.  ABDOMEN: Soft, nondistended, nontender. Bowel sounds present. No organomegaly or mass.  EXTREMITIES: No pedal edema, cyanosis, or clubbing.  NEUROLOGIC: Cranial nerves II through XII are intact. Muscle strength 5/5 in all extremities. Sensation intact. Gait not checked.  PSYCHIATRIC: The patient is alert and oriented x 3.  Normal affect and good eye contact. SKIN: No obvious rash, lesion, or ulcer.   LABORATORY PANEL:   CBC Recent Labs  Lab 08/31/20 1900  WBC 12.1*  HGB 10.0*  HCT 30.6*   PLT 249   ------------------------------------------------------------------------------------------------------------------  Chemistries  Recent Labs  Lab 08/31/20 1900  NA 138  K 3.2*  CL 105  CO2 24  GLUCOSE 101*  BUN 17  CREATININE 0.73  CALCIUM 8.6*   ------------------------------------------------------------------------------------------------------------------  Cardiac Enzymes No results for input(s): TROPONINI in the last 168 hours. ------------------------------------------------------------------------------------------------------------------  RADIOLOGY:  DG Chest 2 View  Result Date: 08/31/2020 CLINICAL DATA:  Chest pain. EXAM: CHEST - 2 VIEW COMPARISON:  01/24/2020 FINDINGS: The heart size and mediastinal contours are within normal limits. Both lungs are clear. The visualized skeletal structures are unremarkable. IMPRESSION: No active cardiopulmonary disease. Electronically Signed   By: Constance Holster M.D.   On: 08/31/2020 19:16   CT Head Wo Contrast  Result Date: 08/31/2020 CLINICAL DATA:  Cerebral hemorrhage suspected Emesis.  Episode of slurred speech. EXAM: CT HEAD WITHOUT CONTRAST TECHNIQUE: Contiguous axial images were obtained from the base of the skull through the vertex without intravenous contrast. COMPARISON:  Head CT 05/18/2016 FINDINGS: Brain: No intracranial hemorrhage, mass effect, or midline shift. No hydrocephalus. The basilar cisterns are patent. No evidence of territorial infarct or acute ischemia. No extra-axial or intracranial  fluid collection. Vascular: No hyperdense vessel or unexpected calcification. Skull: Normal. Negative for fracture or focal lesion. Sinuses/Orbits: Paranasal sinuses and mastoid air cells are clear. The visualized orbits are unremarkable. Other: None. IMPRESSION: Negative noncontrast head CT. Electronically Signed   By: Keith Rake M.D.   On: 08/31/2020 20:37   CT Angio Chest/Abd/Pel for Dissection W and/or Wo  Contrast  Result Date: 08/31/2020 CLINICAL DATA:  Jaw pain.  Emesis. EXAM: CT ANGIOGRAPHY CHEST, ABDOMEN AND PELVIS TECHNIQUE: Non-contrast CT of the chest was initially obtained. Multidetector CT imaging through the chest, abdomen and pelvis was performed using the standard protocol during bolus administration of intravenous contrast. Multiplanar reconstructed images and MIPs were obtained and reviewed to evaluate the vascular anatomy. CONTRAST:  158mL OMNIPAQUE IOHEXOL 350 MG/ML SOLN COMPARISON:  CT dated 01/25/2020 FINDINGS: CTA CHEST FINDINGS Cardiovascular: There is no evidence for thoracic aortic aneurysm or dissection. There is no evidence for an acute pulmonary embolism. The heart size is unremarkable. There is no significant pericardial effusion. Mediastinum/Nodes: -- No mediastinal lymphadenopathy. -- No hilar lymphadenopathy. -- No axillary lymphadenopathy. -- No supraclavicular lymphadenopathy. -- Normal thyroid gland where visualized. -  Unremarkable esophagus. Lungs/Pleura: Airways are patent. No pleural effusion, lobar consolidation, pneumothorax or pulmonary infarction. Musculoskeletal: No chest wall abnormality. No bony spinal canal stenosis. Review of the MIP images confirms the above findings. CTA ABDOMEN AND PELVIS FINDINGS VASCULAR Aorta: Normal caliber aorta without aneurysm, dissection, vasculitis or significant stenosis. Celiac: Patent without evidence of aneurysm, dissection, vasculitis or significant stenosis. SMA: Patent without evidence of aneurysm, dissection, vasculitis or significant stenosis. Renals: Both renal arteries are patent without evidence of aneurysm, dissection, vasculitis, fibromuscular dysplasia or significant stenosis. IMA: Patent without evidence of aneurysm, dissection, vasculitis or significant stenosis. Inflow: Patent without evidence of aneurysm, dissection, vasculitis or significant stenosis. Veins: No obvious venous abnormality within the limitations of this  arterial phase study. Review of the MIP images confirms the above findings. NON-VASCULAR Hepatobiliary: The liver is normal. Normal gallbladder.There is no biliary ductal dilation. Pancreas: Normal contours without ductal dilatation. No peripancreatic fluid collection. Spleen: Unremarkable. Adrenals/Urinary Tract: --Adrenal glands: Unremarkable. --Right kidney/ureter: No hydronephrosis or radiopaque kidney stones. --Left kidney/ureter: No hydronephrosis or radiopaque kidney stones. --Urinary bladder: Unremarkable. Stomach/Bowel: --Stomach/Duodenum: No hiatal hernia or other gastric abnormality. Normal duodenal course and caliber. --Small bowel: Unremarkable. --Colon: Unremarkable. --Appendix: Not visualized. No right lower quadrant inflammation or free fluid. Lymphatic: --No retroperitoneal lymphadenopathy. --No mesenteric lymphadenopathy. --No pelvic or inguinal lymphadenopathy. Reproductive: There is a new cystic ovarian mass involving the right ovary measuring 5.2 cm (axial series 6, image 159). The left ovary is unremarkable. Other: No ascites or free air. The abdominal wall is normal. Musculoskeletal. No acute displaced fractures. Review of the MIP images confirms the above findings. IMPRESSION: 1. No acute thoracic, abdominal or pelvic pathology. Specifically, there is no evidence for an acute pulmonary embolism. 2. 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 Recommend follow-up ultrasound in 6-12 months to confirm resolution. Electronically Signed   By: Constance Holster M.D.   On: 08/31/2020 20:29      IMPRESSION AND PLAN:   1.  Acute Non-ST elevation myocardial infarction with history of coronary artery disease status post PCI and 2 stents. -The patient will be admitted to a progressive unit bed. -We will continue her on IV heparin. -We will place her on high-dose statin therapy with Lipitor as well as aspirin will continue IV nitroglycerin drip. -She will be placed on as  needed IV morphine sulfate for pain. -Beta-blocker therapy will be continued with p.o. Lopressor. -We will continue her Plavix. -Cardiology consult will be obtained as well as 2D echo. -I notified Dr. Dorris Carnes about the patient.  2.  Hypertensive urgency. -This has been improving on IV nitroglycerin drip which will be continued. -We will continue Lopressor.  3.  Hypokalemia. -Potassium will be replaced and magnesium level will be checked.  4.  Right ovarian cyst. -This will need an outpatient follow-up ultrasound in 6 to 12 months.  5.  DVT prophylaxis. -The patient will be on IV heparin drip.    All the records are reviewed and case discussed with ED provider. The plan of care was discussed in details with the patient (and family). I answered all questions. The patient agreed to proceed with the above mentioned plan. Further management will depend upon hospital course.   CODE STATUS: Full code  Status is: Inpatient  Remains inpatient appropriate because:Ongoing active pain requiring inpatient pain management, Ongoing diagnostic testing needed not appropriate for outpatient work up, Unsafe d/c plan, IV treatments appropriate due to intensity of illness or inability to take PO and Inpatient level of care appropriate due to severity of illness   Dispo: The patient is from: Home              Anticipated d/c is to: Home              Anticipated d/c date is: 2 days              Patient currently is not medically stable to d/c.   TOTAL TIME TAKING CARE OF THIS PATIENT: 55 minutes.    Christel Mormon M.D on 09/01/2020 at 12:02 AM  [facility]   From 7 PM-7 AM, contact night-coverage [email or web address]  CC: Primary care physician; Patient, No Pcp Per

## 2020-09-01 NOTE — Progress Notes (Signed)
Pt arrived to room 241 from ED, 8 out of 10 chest pain. Morphine given, nitro given x3, with no relief. VSS. EKG performed, results placed on chart. Troponin ordered. MD aware.

## 2020-09-01 NOTE — Progress Notes (Signed)
PROGRESS NOTE    Brenda Todd   EVO:[id number]  DOB: 1981/09/08  PCP: Patient, No Pcp Per    DOA: 08/31/2020 LOS: 1   Brief Narrative   Brenda Todd  is a 39 y.o. female with a history of hypertension, CAD s/p PCI x2, nephrolithiasis and UTI, presented to the ED on evening of 08/31/20 with midsternal chest pain graded 8/10 in severity radiating from her left shoulder and described as somebody sitting on her chest with associated left jaw tightness.  Associated nausea vomiting and diaphoresis.  Recent intermittent abdominal pain secondary to kidney stone actively passing, no dysuria.  In the ED,  BP uncontrolled 194/106 with otherwise normal vital signs.   Notable labs: troponin 230>>263, K 3.2, WBC 12.1k and mild anemia.   CTA chest ruled out PE. 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 ECG normal sinus 93 bpm without acute ischemic changes and repeat later was similar. Qtc prolonged at 555.  Started on heparin bolus>>infusion, BP treated with IV labetalol, and given morphine and SL nitro, later nitro infusion.  Admitted to hospitalist service with cardiology consulted.      Assessment & Plan   Active Problems:   NSTEMI (non-ST elevated myocardial infarction) (HCC)   Non-STEMI / Elevated Troponin / Chest Pain - present on admission Hx of CAD with prior PCIx2 in 2018 -  --Cardiology consulted ([facility]) --on Heparin gtt --Continue ASA, Lipitor, Lopressor, PRN nitro SL --Has been off Plavix for past year, and Lipitor due to cost --Trend troponin until down-trending --Echo pending - follow up -- [facility] consulted for any medication assistance we can offer.   Melena - 2 episodes per pt in couple days before admission.  No BM since admission. Acute Anemia, suspect due to blood loss - presented with Hbg 10.0 >> 9.3 this AM (down from 12.7, 13.9 in Feb this year).  Given recent history of melena and daily  ibuprofen use, suspect upper GI bleeding. --Anemia panel and FOBT pending --GI consulted --Trend H&H --Transfuse pRBC's as needed, goal of Hbg > 8.0 per cardiology --Clear liquids for now, NPO after midnight   Hypokalemia - replaced.  Monitor BMP.  Replace K as needed for goal > 4.0.   Hx of kidney stones - reports actively passing stone ?Hematuria - pt reported orange/red urine at home.  Ccheck UA.  Will consider urology consult.  Monitor.    Accelerated Hypertension - present on admission BP 194/106, possibly contributed to chest pain and troponin elevation. Essential Hypertension - continue Lopressor.  Consider ACEI/ARB before d/c if renal function tolerates.  Goal BP <130/80.   Dyslipidemia / Hypertriglyceridemia - TG's 250, LDL 95, HDL 35.  Continue Lipitor.    Ovarian Mass - incidental finding on CT.  Follow up ultrasound recommended in 6-12 months as outpatient.   Obesity: Body mass index is 34.19 kg/m.  Complicates overall care and prognosis.  Pt counseled of diet and exercise for weight loss to benefit overall health.   DVT prophylaxis: on heparin gtt   Diet:  Diet Orders (From admission, onward)    Start     Ordered   08/31/20 2222  Diet Heart Room service appropriate? Yes; Fluid consistency: Thin  Diet effective now       Question Answer Comment  Room service appropriate? Yes   Fluid consistency: Thin      08/31/20 2226  Code Status: Full Code    Subjective 09/01/20    Patient says she is still having constant chest pain 8 out of 10 in severity.  Says only morphine seemed to help so far, but only for about 15 minutes.  Says she had 2 episodes of dark stool at home, no foul or different odor than normal.  No Pepto or iron supplements.  Takes ibuprofen every day.  Disposition Plan & Communication   Status is: Inpatient  Remains inpatient appropriate because:Ongoing diagnostic testing needed not appropriate for outpatient work up   Dispo:  The patient is from: Home              Anticipated d/c is to: Home              Anticipated d/c date is: 2 days              Patient currently is not medically stable to d/c.    Family Communication: none at bedside, will attempt to call    Consults, Procedures, Significant Events   Consultants:   Cardiology  Gastroenterology  Procedures:   none  Antimicrobials:  Anti-infectives (From admission, onward)   None        Objective   Vitals:   09/01/20 0630 09/01/20 0645 09/01/20 0700 09/01/20 0730  BP: 133/90  (!) 153/89 127/68  Pulse: 73 72 93 98  Resp: (!) 21 19 19 19   Temp:      TempSrc:      SpO2: 94% 96% 95% 98%  Weight:      Height:        Intake/Output Summary (Last 24 hours) at 09/01/2020 0744 Last data filed at 09/01/2020 0331 Gross per 24 hour  Intake 9.29 ml  Output --  Net 9.29 ml   Filed Weights   08/31/20 1858  Weight: 87.5 kg    Physical Exam:  General exam: awake, alert, no acute distress, obese Respiratory system: CTAB, no wheezes, rales or rhonchi, normal respiratory effort. Cardiovascular system: normal S1/S2, RRR, no JVD, murmurs, rubs, gallops, no pedal edema.   Central nervous system: A&O x4. no gross focal neurologic deficits, normal speech Extremities: moves all, no cyanosis, normal tone Skin: dry, intact, normal temperature, normal color Psychiatry: normal mood, congruent affect, judgement and insight appear normal  Labs   Data Reviewed: I have personally reviewed following labs and imaging studies  CBC: Recent Labs  Lab 08/31/20 1900 09/01/20 0446  WBC 12.1* 11.3*  HGB 10.0* 9.3*  HCT 30.6* 28.7*  MCV 82.3 82.7  PLT 249 809   Basic Metabolic Panel: Recent Labs  Lab 08/31/20 1900 09/01/20 0446  NA 138 140  K 3.2* 3.4*  CL 105 106  CO2 24 27  GLUCOSE 101* 106*  BUN 17 14  CREATININE 0.73 0.72  CALCIUM 8.6* 8.2*  MG  --  2.1   GFR: Estimated Creatinine Clearance: 99 mL/min (by C-G formula based on SCr of  0.72 mg/dL). Liver Function Tests: No results for input(s): AST, ALT, ALKPHOS, BILITOT, PROT, ALBUMIN in the last 168 hours. No results for input(s): LIPASE, AMYLASE in the last 168 hours. No results for input(s): AMMONIA in the last 168 hours. Coagulation Profile: Recent Labs  Lab 08/31/20 1900  INR 1.0   Cardiac Enzymes: No results for input(s): CKTOTAL, CKMB, CKMBINDEX, TROPONINI in the last 168 hours. BNP (last 3 results) No results for input(s): PROBNP in the last 8760 hours. HbA1C: No results for input(s): HGBA1C in the last 72  hours. CBG: No results for input(s): GLUCAP in the last 168 hours. Lipid Profile: Recent Labs    09/01/20 0446  CHOL 180  HDL 35*  LDLCALC 95  TRIG 250*  CHOLHDL 5.1   Thyroid Function Tests: No results for input(s): TSH, T4TOTAL, FREET4, T3FREE, THYROIDAB in the last 72 hours. Anemia Panel: No results for input(s): VITAMINB12, FOLATE, FERRITIN, TIBC, IRON, RETICCTPCT in the last 72 hours. Sepsis Labs: No results for input(s): PROCALCITON, LATICACIDVEN in the last 168 hours.  Recent Results (from the past 240 hour(s))  Respiratory Panel by RT PCR (Flu A&B, Covid) - Nasopharyngeal Swab     Status: None   Collection Time: 08/31/20 10:38 PM   Specimen: Nasopharyngeal Swab  Result Value Ref Range Status   SARS Coronavirus 2 by RT PCR NEGATIVE NEGATIVE Final    Comment: (NOTE) SARS-CoV-2 target nucleic acids are NOT DETECTED.  The SARS-CoV-2 RNA is generally detectable in upper respiratoy specimens during the acute phase of infection. The lowest concentration of SARS-CoV-2 viral copies this assay can detect is 131 copies/mL. A negative result does not preclude SARS-Cov-2 infection and should not be used as the sole basis for treatment or other patient management decisions. A negative result may occur with  improper specimen collection/handling, submission of specimen other than nasopharyngeal swab, presence of viral mutation(s) within  the areas targeted by this assay, and inadequate number of viral copies (<131 copies/mL). A negative result must be combined with clinical observations, patient history, and epidemiological information. The expected result is Negative.  Fact Sheet for Patients:  PinkCheek.be  Fact Sheet for Healthcare Providers:  GravelBags.it  This test is no t yet approved or cleared by the [place] FDA and  has been authorized for detection and/or diagnosis of SARS-CoV-2 by FDA under an Emergency Use Authorization (EUA). This EUA will remain  in effect (meaning this test can be used) for the duration of the COVID-19 declaration under Section 564(b)(1) of the Act, 21 U.S.C. section 360bbb-3(b)(1), unless the authorization is terminated or revoked sooner.     Influenza A by PCR NEGATIVE NEGATIVE Final   Influenza B by PCR NEGATIVE NEGATIVE Final    Comment: (NOTE) The Xpert Xpress SARS-CoV-2/FLU/RSV assay is intended as an aid in  the diagnosis of influenza from Nasopharyngeal swab specimens and  should not be used as a sole basis for treatment. Nasal washings and  aspirates are unacceptable for Xpert Xpress SARS-CoV-2/FLU/RSV  testing.  Fact Sheet for Patients: PinkCheek.be  Fact Sheet for Healthcare Providers: GravelBags.it  This test is not yet approved or cleared by the [place] FDA and  has been authorized for detection and/or diagnosis of SARS-CoV-2 by  FDA under an Emergency Use Authorization (EUA). This EUA will remain  in effect (meaning this test can be used) for the duration of the  Covid-19 declaration under Section 564(b)(1) of the Act, 21  U.S.C. section 360bbb-3(b)(1), unless the authorization is  terminated or revoked. Performed at [facility], [address] [place], [place] [address]       Imaging Studies   DG Chest 2  View  Result Date: 08/31/2020 CLINICAL DATA:  Chest pain. EXAM: CHEST - 2 VIEW COMPARISON:  01/24/2020 FINDINGS: The heart size and mediastinal contours are within normal limits. Both lungs are clear. The visualized skeletal structures are unremarkable. IMPRESSION: No active cardiopulmonary disease. Electronically Signed   By: Constance Holster M.D.   On: 08/31/2020 19:16   CT Head Wo Contrast  Result Date:  08/31/2020 CLINICAL DATA:  Cerebral hemorrhage suspected Emesis.  Episode of slurred speech. EXAM: CT HEAD WITHOUT CONTRAST TECHNIQUE: Contiguous axial images were obtained from the base of the skull through the vertex without intravenous contrast. COMPARISON:  Head CT 05/18/2016 FINDINGS: Brain: No intracranial hemorrhage, mass effect, or midline shift. No hydrocephalus. The basilar cisterns are patent. No evidence of territorial infarct or acute ischemia. No extra-axial or intracranial fluid collection. Vascular: No hyperdense vessel or unexpected calcification. Skull: Normal. Negative for fracture or focal lesion. Sinuses/Orbits: Paranasal sinuses and mastoid air cells are clear. The visualized orbits are unremarkable. Other: None. IMPRESSION: Negative noncontrast head CT. Electronically Signed   By: Keith Rake M.D.   On: 08/31/2020 20:37   CT Angio Chest/Abd/Pel for Dissection W and/or Wo Contrast  Result Date: 08/31/2020 CLINICAL DATA:  Jaw pain.  Emesis. EXAM: CT ANGIOGRAPHY CHEST, ABDOMEN AND PELVIS TECHNIQUE: Non-contrast CT of the chest was initially obtained. Multidetector CT imaging through the chest, abdomen and pelvis was performed using the standard protocol during bolus administration of intravenous contrast. Multiplanar reconstructed images and MIPs were obtained and reviewed to evaluate the vascular anatomy. CONTRAST:  162mL OMNIPAQUE IOHEXOL 350 MG/ML SOLN COMPARISON:  CT dated 01/25/2020 FINDINGS: CTA CHEST FINDINGS Cardiovascular: There is no evidence for thoracic aortic  aneurysm or dissection. There is no evidence for an acute pulmonary embolism. The heart size is unremarkable. There is no significant pericardial effusion. Mediastinum/Nodes: -- No mediastinal lymphadenopathy. -- No hilar lymphadenopathy. -- No axillary lymphadenopathy. -- No supraclavicular lymphadenopathy. -- Normal thyroid gland where visualized. -  Unremarkable esophagus. Lungs/Pleura: Airways are patent. No pleural effusion, lobar consolidation, pneumothorax or pulmonary infarction. Musculoskeletal: No chest wall abnormality. No bony spinal canal stenosis. Review of the MIP images confirms the above findings. CTA ABDOMEN AND PELVIS FINDINGS VASCULAR Aorta: Normal caliber aorta without aneurysm, dissection, vasculitis or significant stenosis. Celiac: Patent without evidence of aneurysm, dissection, vasculitis or significant stenosis. SMA: Patent without evidence of aneurysm, dissection, vasculitis or significant stenosis. Renals: Both renal arteries are patent without evidence of aneurysm, dissection, vasculitis, fibromuscular dysplasia or significant stenosis. IMA: Patent without evidence of aneurysm, dissection, vasculitis or significant stenosis. Inflow: Patent without evidence of aneurysm, dissection, vasculitis or significant stenosis. Veins: No obvious venous abnormality within the limitations of this arterial phase study. Review of the MIP images confirms the above findings. NON-VASCULAR Hepatobiliary: The liver is normal. Normal gallbladder.There is no biliary ductal dilation. Pancreas: Normal contours without ductal dilatation. No peripancreatic fluid collection. Spleen: Unremarkable. Adrenals/Urinary Tract: --Adrenal glands: Unremarkable. --Right kidney/ureter: No hydronephrosis or radiopaque kidney stones. --Left kidney/ureter: No hydronephrosis or radiopaque kidney stones. --Urinary bladder: Unremarkable. Stomach/Bowel: --Stomach/Duodenum: No hiatal hernia or other gastric abnormality. Normal  duodenal course and caliber. --Small bowel: Unremarkable. --Colon: Unremarkable. --Appendix: Not visualized. No right lower quadrant inflammation or free fluid. Lymphatic: --No retroperitoneal lymphadenopathy. --No mesenteric lymphadenopathy. --No pelvic or inguinal lymphadenopathy. Reproductive: There is a new cystic ovarian mass involving the right ovary measuring 5.2 cm (axial series 6, image 159). The left ovary is unremarkable. Other: No ascites or free air. The abdominal wall is normal. Musculoskeletal. No acute displaced fractures. Review of the MIP images confirms the above findings. IMPRESSION: 1. No acute thoracic, abdominal or pelvic pathology. Specifically, there is no evidence for an acute pulmonary embolism. 2. 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 Recommend follow-up ultrasound in 6-12 months to confirm resolution. Electronically Signed   By: Constance Holster M.D.   On: 08/31/2020 20:29  Medications   Scheduled Meds: . aspirin  81 mg Oral Daily  . aspirin EC  81 mg Oral Daily  . atorvastatin  80 mg Oral Daily  . metoprolol tartrate  25 mg Oral BID   Continuous Infusions: . sodium chloride 100 mL/hr at 09/01/20 0040  . heparin 1,100 Units/hr (09/01/20 0611)  . nitroGLYCERIN Stopped (09/01/20 0530)       LOS: 1 day    Time spent: 30 minutes    Ezekiel Slocumb, DO [facility]  09/01/2020, 7:44 AM    If 7PM-7AM, please contact night-coverage. How to contact the [facility] Attending or Consulting provider [place] or covering provider during after hours [place], for this patient?    1. Check the care team in [facility] and look for a) attending/consulting [facility] provider listed and b) the [facility] team listed 2. Log into [email or web address] and use [facility]'s universal password to access. If you do not have the password, please contact the hospital operator. 3. Locate the [facility] provider you are looking for under [facility] and page to a number that  you can be directly reached. 4. If you still have difficulty reaching the provider, please page the [facility] (Director on Call) for the Hospitalists listed on amion for assistance.

## 2020-09-01 NOTE — Progress Notes (Signed)
Mobility Specialist - Progress Note   09/01/20 1600  Mobility  Activity Ambulated in room  Level of Assistance Independent  Assistive Device None (Pt pushed IV pole)  Distance Ambulated (ft) 85 ft  Mobility Response Tolerated well  Mobility performed by Mobility specialist  $Mobility charge 1 Mobility    Pre-mobility: 78 HR, 160/91 BP, 99% SpO2 Post-mobility: 81 HR, 167/95 BP, 96% SpO2   Pt was lying in bed upon arrival. Pt agreed to session. Pt c/o tiredness d/t lack of sleep and chest pain, but per discussion with nurse, this has been an ongoing complaint all day and has been treated. Pt presented no limititations for mobility and stated she had been ambulating in her room all day. Pt was independent in all transfers including ambulation. No AD was used for this session. Pt ambulated a total of 85' in room with no interest in furthering distance into hallway. When asked if pt had any SOB, pt stated "I'm always short of breath, but no". No heavy breathing was noted, no LOB noted. Overall, pt tolerated session well. Pt was left in bed with all needs in reach.    Kathee Delton Mobility Specialist 09/01/20, 4:20 PM

## 2020-09-02 ENCOUNTER — Encounter
Admission: EM | Disposition: A | Discharge status: Home / Self Care | Payer: Self-pay | Source: Home / Self Care | Attending: Internal Medicine

## 2020-09-02 ENCOUNTER — Encounter: Payer: Self-pay | Admitting: Internal Medicine

## 2020-09-02 DIAGNOSIS — D509 Iron deficiency anemia, unspecified: Secondary | ICD-10-CM

## 2020-09-02 DIAGNOSIS — D500 Iron deficiency anemia secondary to blood loss (chronic): Secondary | ICD-10-CM

## 2020-09-02 DIAGNOSIS — I214 Non-ST elevation (NSTEMI) myocardial infarction: Principal | ICD-10-CM

## 2020-09-02 DIAGNOSIS — I2510 Atherosclerotic heart disease of native coronary artery without angina pectoris: Secondary | ICD-10-CM

## 2020-09-02 HISTORY — PX: LEFT HEART CATH AND CORONARY ANGIOGRAPHY: CATH118249

## 2020-09-02 LAB — BASIC METABOLIC PANEL
Anion gap: 9 (ref 5–15)
Anion gap: 9 (ref 5–15)
BUN: 8 mg/dL (ref 6–20)
BUN: 12 mg/dL (ref 6–20)
CO2: 24 mmol/L (ref 22–32)
CO2: 23 mmol/L (ref 22–32)
Calcium: 8.1 mg/dL — ABNORMAL LOW (ref 8.9–10.3)
Calcium: 8.4 mg/dL — ABNORMAL LOW (ref 8.9–10.3)
Chloride: 104 mmol/L (ref 98–111)
Chloride: 106 mmol/L (ref 98–111)
Creatinine, Ser: 0.61 mg/dL (ref 0.44–1.00)
Creatinine, Ser: 0.66 mg/dL (ref 0.44–1.00)
GFR calc Af Amer: >60 mL/min (ref >60)
GFR calc Af Amer: >60 mL/min (ref >60)
GFR calc non Af Amer: >60 mL/min (ref >60)
GFR calc non Af Amer: >60 mL/min (ref >60)
Glucose, Bld: 98 mg/dL (ref 70–99)
Glucose, Bld: 103 mg/dL — ABNORMAL HIGH (ref 70–99)
Potassium: 3.1 mmol/L — ABNORMAL LOW (ref 3.5–5.1)
Potassium: 3.8 mmol/L (ref 3.5–5.1)
Sodium: 137 mmol/L (ref 135–145)
Sodium: 138 mmol/L (ref 135–145)

## 2020-09-02 LAB — CBC
HCT: 30.1 % — ABNORMAL LOW (ref 36.0–46.0)
Hemoglobin: 10.2 g/dL — ABNORMAL LOW (ref 12.0–15.0)
MCH: 27.3 pg (ref 26.0–34.0)
MCHC: 33.9 g/dL (ref 30.0–36.0)
MCV: 80.5 fL (ref 80.0–100.0)
Platelets: 212 10*3/uL (ref 150–400)
RBC: 3.74 MIL/uL — ABNORMAL LOW (ref 3.87–5.11)
RDW: 15.7 % — ABNORMAL HIGH (ref 11.5–15.5)
WBC: 10.9 10*3/uL — ABNORMAL HIGH (ref 4.0–10.5)
nRBC: 0 % (ref 0.0–0.2)

## 2020-09-02 LAB — HEPARIN LEVEL (UNFRACTIONATED)
Heparin Unfractionated: 0.39 IU/mL (ref 0.30–0.70)
Heparin Unfractionated: 0.35 IU/mL (ref 0.30–0.70)

## 2020-09-02 LAB — HEMOGLOBIN A1C
Hgb A1c MFr Bld: 5.6 % (ref 4.8–5.6)
Mean Plasma Glucose: 114.02 mg/dL

## 2020-09-02 SURGERY — LEFT HEART CATH AND CORONARY ANGIOGRAPHY
Anesthesia: Moderate Sedation

## 2020-09-02 MED ORDER — DIPHENHYDRAMINE HCL 50 MG/ML IJ SOLN
25.0000 mg | Freq: Once | INTRAMUSCULAR | Status: AC
  Administered 2020-09-02: 25 mg via INTRAVENOUS
  Filled 2020-09-02: qty 1

## 2020-09-02 MED ORDER — SODIUM CHLORIDE 0.9 % IV SOLN: 250.0000 mL | INTRAVENOUS | Status: DC | PRN

## 2020-09-02 MED ORDER — TRAMADOL HCL 50 MG PO TABS
50.0000 mg | ORAL_TABLET | Freq: Four times a day (QID) | ORAL | Status: DC | PRN
  Administered 2020-09-02: 50 mg via ORAL
  Filled 2020-09-02: qty 1

## 2020-09-02 MED ORDER — FUROSEMIDE 10 MG/ML IJ SOLN
INTRAMUSCULAR | Status: DC | PRN
  Administered 2020-09-02: 20 mg via INTRAVENOUS

## 2020-09-02 MED ORDER — MIDAZOLAM HCL 2 MG/2ML IJ SOLN
INTRAMUSCULAR | Status: DC | PRN
  Administered 2020-09-02 (×2): 1 mg via INTRAVENOUS

## 2020-09-02 MED ORDER — POTASSIUM CHLORIDE CRYS ER 20 MEQ PO TBCR
40.0000 meq | EXTENDED_RELEASE_TABLET | Freq: Two times a day (BID) | ORAL | Status: AC
  Administered 2020-09-02 (×2): 40 meq via ORAL
  Filled 2020-09-02 (×2): qty 2

## 2020-09-02 MED ORDER — IOHEXOL 300 MG/ML  SOLN
INTRAMUSCULAR | Status: DC | PRN
  Administered 2020-09-02: 40 mL

## 2020-09-02 MED ORDER — HEPARIN (PORCINE) IN NACL 1000-0.9 UT/500ML-% IV SOLN
INTRAVENOUS | Status: DC | PRN
  Administered 2020-09-02: 500 mL

## 2020-09-02 MED ORDER — SODIUM CHLORIDE 0.9% FLUSH: 3.0000 mL | INTRAVENOUS | Status: DC | PRN

## 2020-09-02 MED ORDER — FUROSEMIDE 10 MG/ML IJ SOLN
INTRAMUSCULAR | Status: AC
  Filled 2020-09-02: qty 4

## 2020-09-02 MED ORDER — SODIUM CHLORIDE 0.9% FLUSH
3.0000 mL | Freq: Two times a day (BID) | INTRAVENOUS | Status: DC
  Administered 2020-09-02 – 2020-09-03 (×3): 3 mL via INTRAVENOUS

## 2020-09-02 MED ORDER — VERAPAMIL HCL 2.5 MG/ML IV SOLN
INTRAVENOUS | Status: AC
  Filled 2020-09-02: qty 2

## 2020-09-02 MED ORDER — MIDAZOLAM HCL 2 MG/2ML IJ SOLN
INTRAMUSCULAR | Status: AC
  Filled 2020-09-02: qty 2

## 2020-09-02 MED ORDER — HEPARIN SODIUM (PORCINE) 1000 UNIT/ML IJ SOLN
INTRAMUSCULAR | Status: DC | PRN
  Administered 2020-09-02: 4500 [IU] via INTRAVENOUS

## 2020-09-02 MED ORDER — LABETALOL HCL 5 MG/ML IV SOLN: 10.0000 mg | INTRAVENOUS | Status: AC | PRN

## 2020-09-02 MED ORDER — HEPARIN SODIUM (PORCINE) 1000 UNIT/ML IJ SOLN
INTRAMUSCULAR | Status: AC
  Filled 2020-09-02: qty 1

## 2020-09-02 MED ORDER — SODIUM CHLORIDE 0.9 % IV SOLN
200.0000 mg | Freq: Once | INTRAVENOUS | Status: AC
  Administered 2020-09-02: 200 mg via INTRAVENOUS
  Filled 2020-09-02: qty 10

## 2020-09-02 MED ORDER — HEPARIN (PORCINE) IN NACL 1000-0.9 UT/500ML-% IV SOLN
INTRAVENOUS | Status: AC
  Filled 2020-09-02: qty 1000

## 2020-09-02 MED ORDER — HYDRALAZINE HCL 20 MG/ML IJ SOLN: 10.0000 mg | INTRAMUSCULAR | Status: AC | PRN

## 2020-09-02 MED ORDER — FENTANYL CITRATE (PF) 100 MCG/2ML IJ SOLN
INTRAMUSCULAR | Status: AC
  Filled 2020-09-02: qty 2

## 2020-09-02 MED ORDER — HEPARIN (PORCINE) 25000 UT/250ML-% IV SOLN
1600.0000 [IU]/h | INTRAVENOUS | Status: DC
  Administered 2020-09-02 – 2020-09-03 (×2): 1600 [IU]/h via INTRAVENOUS
  Filled 2020-09-02 (×2): qty 250

## 2020-09-02 MED ORDER — ASPIRIN 81 MG PO CHEW: 81.0000 mg | CHEWABLE_TABLET | ORAL | Status: DC

## 2020-09-02 MED ORDER — VERAPAMIL HCL 2.5 MG/ML IV SOLN
INTRAVENOUS | Status: DC | PRN
  Administered 2020-09-02: 2.5 mg via INTRA_ARTERIAL

## 2020-09-02 MED ORDER — FENTANYL CITRATE (PF) 100 MCG/2ML IJ SOLN
INTRAMUSCULAR | Status: DC | PRN
Start: 2020-09-02 — End: 2020-09-02
  Administered 2020-09-02: 50 ug via INTRAVENOUS
  Administered 2020-09-02: 25 ug via INTRAVENOUS

## 2020-09-02 MED ORDER — SODIUM CHLORIDE 0.9 % IV SOLN: INTRAVENOUS | Status: DC

## 2020-09-02 MED ORDER — FUROSEMIDE 10 MG/ML IJ SOLN
20.0000 mg | Freq: Two times a day (BID) | INTRAMUSCULAR | Status: DC
  Administered 2020-09-02 – 2020-09-03 (×2): 20 mg via INTRAVENOUS
  Filled 2020-09-02 (×2): qty 2

## 2020-09-02 MED ORDER — ISOSORBIDE MONONITRATE ER 30 MG PO TB24
15.0000 mg | ORAL_TABLET | Freq: Every day | ORAL | Status: DC
  Administered 2020-09-02: 15 mg via ORAL
  Filled 2020-09-02 (×2): qty 1

## 2020-09-02 MED ORDER — SPIRONOLACTONE 25 MG PO TABS
25.0000 mg | ORAL_TABLET | Freq: Every day | ORAL | Status: DC
  Administered 2020-09-02 – 2020-09-03 (×2): 25 mg via ORAL
  Filled 2020-09-02 (×3): qty 1

## 2020-09-02 SURGICAL SUPPLY — 8 items
CATH 5F 110X4 TIG (CATHETERS) ×2 IMPLANT
CATH INFINITI 5FR ANG PIGTAIL (CATHETERS) ×2 IMPLANT
DEVICE RAD TR BAND REGULAR (VASCULAR PRODUCTS) ×2 IMPLANT
GLIDESHEATH SLEND SS 6F .021 (SHEATH) ×2 IMPLANT
KIT MANI 3VAL PERCEP (MISCELLANEOUS) ×3 IMPLANT
PACK CARDIAC CATH (CUSTOM PROCEDURE TRAY) ×3 IMPLANT
WIRE HITORQ VERSACORE ST 145CM (WIRE) ×2 IMPLANT
WIRE ROSEN-J .035X260CM (WIRE) ×2 IMPLANT

## 2020-09-02 NOTE — Progress Notes (Signed)
ANTICOAGULATION CONSULT NOTE  Pharmacy Consult for Heparin Drip Indication: chest pain/ACS s/p LHC  Allergies  Allergen Reactions  . No [employer] Food Allergies Anaphylaxis and Other (See Comments)    Cherries  . Penicillins Anaphylaxis    Patient Measurements: Height: 5\' 3"  (160 cm) Weight: 87.8 kg (193 lb 9 oz) IBW/kg (Calculated) : 52.4 Heparin Dosing Weight: 72.1 kg  Vital Signs: Temp: 99.4 F (37.4 C) (09/28 2034) Temp Source: Oral (09/28 2034) BP: 156/83 (09/28 2034) Pulse Rate: 94 (09/28 2034)  Labs: Recent Labs    08/31/20 1900 08/31/20 2056 09/01/20 0446 09/01/20 0446 09/01/20 0940 09/01/20 1217 09/01/20 1217 09/01/20 1413 09/01/20 2150 09/02/20 0609 09/02/20 1607 09/02/20 2049  HGB 10.0*   < > 9.3*  --   --   --   --   --   --  10.2*  --   --   HCT 30.6*  --  28.7*  --   --   --   --   --   --  30.1*  --   --   PLT 249  --  236  --   --   --   --   --   --  212  --   --   APTT 27  --   --   --   --   --   --   --   --   --   --   --   LABPROT 12.5  --   --   --   --   --   --   --   --   --   --   --   INR 1.0  --   --   --   --   --   --   --   --   --   --   --   HEPARINUNFRC  --   --  <0.10*   < >  --  <0.10*   < >  --  0.14* 0.39  --  0.35  CREATININE 0.73   < > 0.72  --   --   --   --   --   --  0.61 0.66  --   TROPONINIHS 230*   < >  --   --  331* 306*  --  247*  --   --   --   --    < > = values in this interval not displayed.    Estimated Creatinine Clearance: 99.3 mL/min (by C-G formula based on SCr of 0.66 mg/dL).   Medical History: Past Medical History:  Diagnosis Date  . Chronic hypertension   . Kidney calculi   . UTI (lower urinary tract infection)    Assessment: Patient is a 39yo female presenting with chest pain with PMH significant for HTN, CAD s/p PCI and 2 stents (2018), and nephrolithiasis and UTI. Per chart review, No prior anticoagulants listed in home meds.Troponin 230>263 peaked at 331. EKG showed sinus rhythm with no  significant ST or T wave changes. Chest CTA revealed no evidence of PE. Echo with LVEF 41-74%; LV diastolic consistent with Grade II diastolic dysfunction. Pharmacy consulted for restarting Heparin 2 hours after TR band removal s/p LHC 9/28.   Confirmed with nurse that previous heparin drip was stopped prior to procedure.  TR band removed 9/28 @ 1115 Patient received 4500 units heparin @0913  during the procedure  Hgb 10>9.3>10.2 Plt 249>236>212  9/27 1217 HL:  <  0.1 9/27 2150 HL: 0.14 9/28 0609 HL: 0.39  9/28 2049 HL: 0.35  Goal of Therapy:  Heparin level 0.3-0.7 units/ml Monitor platelets by anticoagulation protocol: Yes   Plan:  --Heparin level is therapeutic. Continue heparin at at 1600 units/hr --Check anti-Xa level tomorrow AM --Continue to monitor H&H and platelets daily  Benita Gutter 09/02/2020 9:30 PM

## 2020-09-02 NOTE — Progress Notes (Signed)
Mobility Specialist - Progress Note     09/02/20 1700  Mobility  Activity Refused mobility  Mobility performed by Mobility specialist    Pt declined mobility this date stating that she is "tired and just wants to relax". Pt reassured mobility tech that she's been ambulating in room and to bathroom all day. Will attempt session at another date.   Kathee Delton Mobility Specialist 09/02/20, 5:22 PM

## 2020-09-02 NOTE — Progress Notes (Signed)
Vonda Antigua, MD [address] [place], [place], [place], [address] [address], [place], [place], [place], [address] Phone: [phone]  Fax: [phone]   Subjective:  Patient is status post cardiac cath today.  Denies any abdominal pain.  No bowel movements.  No active GI bleeding.  Objective: Exam: Vital signs in last 24 hours: Vitals:   09/02/20 1030 09/02/20 1045 09/02/20 1100 09/02/20 1140  BP: (!) 150/91 137/80 (!) 148/86 140/80  Pulse: 80 82 82   Resp: (!) 30 (!) 26 (!) 26 18  Temp:    99 F (37.2 C)  TempSrc:    Oral  SpO2: 94% 94% 96%   Weight:      Height:       Weight change: 0.272 kg  Intake/Output Summary (Last 24 hours) at 09/02/2020 1304 Last data filed at 09/02/2020 1100 Gross per 24 hour  Intake 3419.25 ml  Output 2000 ml  Net 1419.25 ml    General: No acute distress, AAO x3 Abd: Soft, NT/ND, No HSM Skin: Warm, no rashes Neck: Supple, Trachea midline   Lab Results: Lab Results  Component Value Date   WBC 10.9 (H) 09/02/2020   HGB 10.2 (L) 09/02/2020   HCT 30.1 (L) 09/02/2020   MCV 80.5 09/02/2020   PLT 212 09/02/2020   Micro Results: Recent Results (from the past 240 hour(s))  Respiratory Panel by RT PCR (Flu A&B, Covid) - Nasopharyngeal Swab     Status: None   Collection Time: 08/31/20 10:38 PM   Specimen: Nasopharyngeal Swab  Result Value Ref Range Status   SARS Coronavirus 2 by RT PCR NEGATIVE NEGATIVE Final    Comment: (NOTE) SARS-CoV-2 target nucleic acids are NOT DETECTED.  The SARS-CoV-2 RNA is generally detectable in upper respiratoy specimens during the acute phase of infection. The lowest concentration of SARS-CoV-2 viral copies this assay can detect is 131 copies/mL. A negative result does not preclude SARS-Cov-2 infection and should not be used as the sole basis for treatment or other patient management decisions. A negative result may occur with  improper specimen collection/handling, submission of  specimen other than nasopharyngeal swab, presence of viral mutation(s) within the areas targeted by this assay, and inadequate number of viral copies (<131 copies/mL). A negative result must be combined with clinical observations, patient history, and epidemiological information. The expected result is Negative.  Fact Sheet for Patients:  PinkCheek.be  Fact Sheet for Healthcare Providers:  GravelBags.it  This test is no t yet approved or cleared by the [place] FDA and  has been authorized for detection and/or diagnosis of SARS-CoV-2 by FDA under an Emergency Use Authorization (EUA). This EUA will remain  in effect (meaning this test can be used) for the duration of the COVID-19 declaration under Section 564(b)(1) of the Act, 21 U.S.C. section 360bbb-3(b)(1), unless the authorization is terminated or revoked sooner.     Influenza A by PCR NEGATIVE NEGATIVE Final   Influenza B by PCR NEGATIVE NEGATIVE Final    Comment: (NOTE) The Xpert Xpress SARS-CoV-2/FLU/RSV assay is intended as an aid in  the diagnosis of influenza from Nasopharyngeal swab specimens and  should not be used as a sole basis for treatment. Nasal washings and  aspirates are unacceptable for Xpert Xpress SARS-CoV-2/FLU/RSV  testing.  Fact Sheet for Patients: PinkCheek.be  Fact Sheet for Healthcare Providers: GravelBags.it  This test is not yet approved or cleared by the [place] FDA and  has been authorized for detection and/or diagnosis of SARS-CoV-2  by  FDA under an Emergency Use Authorization (EUA). This EUA will remain  in effect (meaning this test can be used) for the duration of the  Covid-19 declaration under Section 564(b)(1) of the Act, 21  U.S.C. section 360bbb-3(b)(1), unless the authorization is  terminated or revoked. Performed at [facility], [address] [place], [place] [address]    Studies/Results: [place] Chest 2 View  Result Date: 08/31/2020 CLINICAL DATA:  Chest pain. EXAM: CHEST - 2 VIEW COMPARISON:  01/24/2020 FINDINGS: The heart size and mediastinal contours are within normal limits. Both lungs are clear. The visualized skeletal structures are unremarkable. IMPRESSION: No active cardiopulmonary disease. Electronically Signed   By: Constance Holster M.D.   On: 08/31/2020 19:16   CT Head Wo Contrast  Result Date: 08/31/2020 CLINICAL DATA:  Cerebral hemorrhage suspected Emesis.  Episode of slurred speech. EXAM: CT HEAD WITHOUT CONTRAST TECHNIQUE: Contiguous axial images were obtained from the base of the skull through the vertex without intravenous contrast. COMPARISON:  Head CT 05/18/2016 FINDINGS: Brain: No intracranial hemorrhage, mass effect, or midline shift. No hydrocephalus. The basilar cisterns are patent. No evidence of territorial infarct or acute ischemia. No extra-axial or intracranial fluid collection. Vascular: No hyperdense vessel or unexpected calcification. Skull: Normal. Negative for fracture or focal lesion. Sinuses/Orbits: Paranasal sinuses and mastoid air cells are clear. The visualized orbits are unremarkable. Other: None. IMPRESSION: Negative noncontrast head CT. Electronically Signed   By: Keith Rake M.D.   On: 08/31/2020 20:37   CARDIAC CATHETERIZATION  Result Date: 09/02/2020 Conclusions: 1. Severe single-vessel coronary artery disease; culprit lesion appears to be 99% stenosis in small OM2 branch (vessel is at most 2 mm in diameter).  Mild to moderate, non-obstructive CAD involving the LCx and RCA. 2. Widely patent rPDA and rPL1 stents. 3. Severely elevated left ventricular filling pressure (LVEDP 35-40 mmHg). Recommendations: 1. Aggressive medical therapy, given small size of culprit vessel, severely elevated LVEDP, and recent melena. 2. Restart heparin 2 hours after TR band removal, to complete at least 48 hours of  IV heparin from admission. 3. Continue aspirin 81 mg daily (if tolerated from a GI standpoint).  Consider adding P2Y12 inhibitor to complete 12 months of DAPT, once cleared by GI. 4. Aggressive diuresis.  Furosemide 20 mg IV x 1 given during procedure.  Additional dosing will likely be required. 5. Aggressive secondary prevention of CAD. Nelva Bush, MD [facility]   ECHOCARDIOGRAM COMPLETE  Result Date: 09/01/2020    ECHOCARDIOGRAM REPORT   Patient Name:   Brenda Todd Date of Exam: 09/01/2020 Medical Rec #:  [id number]       Height:       63.0 in Accession #:    [id number]      Weight:       193.0 lb Date of Birth:  [birth year]        BSA:          1.905 m Patient Age:    6 years        BP:           127/68 mmHg Patient Gender: F               HR:           92 bpm. Exam Location:  [facility] Procedure: 2D Echo Indications:     NSTEMI I21.4  History:         Patient has no prior history of Echocardiogram examinations.  Previous Myocardial Infarction; Risk Factors:Hypertension.  Sonographer:     L Thornton-Maynard Referring Phys:  [id number] [place] Diagnosing Phys: Kathlyn Sacramento MD IMPRESSIONS  1. Left ventricular ejection fraction, by estimation, is 55 to 60%. The left ventricle has normal function. The left ventricle has no regional wall motion abnormalities. There is moderate left ventricular hypertrophy. Left ventricular diastolic parameters are consistent with Grade II diastolic dysfunction (pseudonormalization).  2. Right ventricular systolic function is normal. The right ventricular size is normal. There is mildly elevated pulmonary artery systolic pressure.  3. Left atrial size was moderately dilated.  4. The mitral valve is normal in structure. Mild mitral valve regurgitation. No evidence of mitral stenosis.  5. The aortic valve is normal in structure. Aortic valve regurgitation is not visualized. No aortic stenosis is present.  6. The inferior vena cava is normal in size with <50%  respiratory variability, suggesting right atrial pressure of 8 mmHg. FINDINGS  Left Ventricle: Left ventricular ejection fraction, by estimation, is 55 to 60%. The left ventricle has normal function. The left ventricle has no regional wall motion abnormalities. The left ventricular internal cavity size was normal in size. There is  moderate left ventricular hypertrophy. Left ventricular diastolic parameters are consistent with Grade II diastolic dysfunction (pseudonormalization). Right Ventricle: The right ventricular size is normal. No increase in right ventricular wall thickness. Right ventricular systolic function is normal. There is mildly elevated pulmonary artery systolic pressure. The tricuspid regurgitant velocity is 2.99  m/s, and with an assumed right atrial pressure of 8 mmHg, the estimated right ventricular systolic pressure is 64.3 mmHg. Left Atrium: Left atrial size was moderately dilated. Right Atrium: Right atrial size was normal in size. Pericardium: There is no evidence of pericardial effusion. Mitral Valve: The mitral valve is normal in structure. Mild mitral valve regurgitation. No evidence of mitral valve stenosis. Tricuspid Valve: The tricuspid valve is normal in structure. Tricuspid valve regurgitation is mild . No evidence of tricuspid stenosis. Aortic Valve: The aortic valve is normal in structure. Aortic valve regurgitation is not visualized. No aortic stenosis is present. Aortic valve mean gradient measures 5.0 mmHg. Aortic valve peak gradient measures 7.0 mmHg. Aortic valve area, by VTI measures 2.10 cm. Pulmonic Valve: The pulmonic valve was normal in structure. Pulmonic valve regurgitation is mild. No evidence of pulmonic stenosis. Aorta: The aortic root is normal in size and structure. Venous: The inferior vena cava is normal in size with less than 50% respiratory variability, suggesting right atrial pressure of 8 mmHg. IAS/Shunts: No atrial level shunt detected by color flow Doppler.   LEFT VENTRICLE PLAX 2D LVIDd:         4.82 cm  Diastology LVIDs:         3.45 cm  LV e' medial:    7.94 cm/s LV PW:         1.35 cm  LV E/e' medial:  14.4 LV IVS:        1.52 cm  LV e' lateral:   7.18 cm/s LVOT diam:     1.80 cm  LV E/e' lateral: 15.9 LV SV:         53 LV SV Index:   28 LVOT Area:     2.54 cm  RIGHT VENTRICLE RV S prime:     10.60 cm/s TAPSE (M-mode): 2.8 cm LEFT ATRIUM             Index LA diam:        3.20 cm 1.68 cm/m  LA Vol (A2C):   86.0 ml 45.15 ml/m LA Vol (A4C):   93.9 ml 49.30 ml/m LA Biplane Vol: 89.3 ml 46.89 ml/m  AORTIC VALVE                    PULMONIC VALVE AV Area (Vmax):    2.14 cm     PV Vmax:       0.99 m/s AV Area (Vmean):   1.85 cm     PV Peak grad:  3.9 mmHg AV Area (VTI):     2.10 cm AV Vmax:           132.00 cm/s AV Vmean:          104.000 cm/s AV VTI:            0.251 m AV Peak Grad:      7.0 mmHg AV Mean Grad:      5.0 mmHg LVOT Vmax:         111.00 cm/s LVOT Vmean:        75.600 cm/s LVOT VTI:          0.207 m LVOT/AV VTI ratio: 0.82  AORTA Ao Root diam: 3.00 cm MITRAL VALVE                TRICUSPID VALVE MV Area (PHT): 2.86 cm     TR Peak grad:   35.8 mmHg MV E velocity: 114.00 cm/s  TR Vmax:        299.00 cm/s MV A velocity: 49.00 cm/s MV E/A ratio:  2.33         SHUNTS                             Systemic VTI:  0.21 m                             Systemic Diam: 1.80 cm Kathlyn Sacramento MD Electronically signed by Kathlyn Sacramento MD Signature Date/Time: 09/01/2020/12:02:56 PM    Final    CT Angio Chest/Abd/Pel for Dissection W and/or Wo Contrast  Result Date: 08/31/2020 CLINICAL DATA:  Jaw pain.  Emesis. EXAM: CT ANGIOGRAPHY CHEST, ABDOMEN AND PELVIS TECHNIQUE: Non-contrast CT of the chest was initially obtained. Multidetector CT imaging through the chest, abdomen and pelvis was performed using the standard protocol during bolus administration of intravenous contrast. Multiplanar reconstructed images and MIPs were obtained and reviewed to evaluate the vascular  anatomy. CONTRAST:  172mL OMNIPAQUE IOHEXOL 350 MG/ML SOLN COMPARISON:  CT dated 01/25/2020 FINDINGS: CTA CHEST FINDINGS Cardiovascular: There is no evidence for thoracic aortic aneurysm or dissection. There is no evidence for an acute pulmonary embolism. The heart size is unremarkable. There is no significant pericardial effusion. Mediastinum/Nodes: -- No mediastinal lymphadenopathy. -- No hilar lymphadenopathy. -- No axillary lymphadenopathy. -- No supraclavicular lymphadenopathy. -- Normal thyroid gland where visualized. -  Unremarkable esophagus. Lungs/Pleura: Airways are patent. No pleural effusion, lobar consolidation, pneumothorax or pulmonary infarction. Musculoskeletal: No chest wall abnormality. No bony spinal canal stenosis. Review of the MIP images confirms the above findings. CTA ABDOMEN AND PELVIS FINDINGS VASCULAR Aorta: Normal caliber aorta without aneurysm, dissection, vasculitis or significant stenosis. Celiac: Patent without evidence of aneurysm, dissection, vasculitis or significant stenosis. SMA: Patent without evidence of aneurysm, dissection, vasculitis or significant stenosis. Renals: Both renal arteries are patent without evidence of aneurysm, dissection, vasculitis, fibromuscular dysplasia or significant stenosis. IMA: Patent without  evidence of aneurysm, dissection, vasculitis or significant stenosis. Inflow: Patent without evidence of aneurysm, dissection, vasculitis or significant stenosis. Veins: No obvious venous abnormality within the limitations of this arterial phase study. Review of the MIP images confirms the above findings. NON-VASCULAR Hepatobiliary: The liver is normal. Normal gallbladder.There is no biliary ductal dilation. Pancreas: Normal contours without ductal dilatation. No peripancreatic fluid collection. Spleen: Unremarkable. Adrenals/Urinary Tract: --Adrenal glands: Unremarkable. --Right kidney/ureter: No hydronephrosis or radiopaque kidney stones. --Left  kidney/ureter: No hydronephrosis or radiopaque kidney stones. --Urinary bladder: Unremarkable. Stomach/Bowel: --Stomach/Duodenum: No hiatal hernia or other gastric abnormality. Normal duodenal course and caliber. --Small bowel: Unremarkable. --Colon: Unremarkable. --Appendix: Not visualized. No right lower quadrant inflammation or free fluid. Lymphatic: --No retroperitoneal lymphadenopathy. --No mesenteric lymphadenopathy. --No pelvic or inguinal lymphadenopathy. Reproductive: There is a new cystic ovarian mass involving the right ovary measuring 5.2 cm (axial series 6, image 159). The left ovary is unremarkable. Other: No ascites or free air. The abdominal wall is normal. Musculoskeletal. No acute displaced fractures. Review of the MIP images confirms the above findings. IMPRESSION: 1. No acute thoracic, abdominal or pelvic pathology. Specifically, there is no evidence for an acute pulmonary embolism. 2. 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 Recommend follow-up ultrasound in 6-12 months to confirm resolution. Electronically Signed   By: Constance Holster M.D.   On: 08/31/2020 20:29   Medications:  Scheduled Meds: . aspirin  81 mg Oral Daily  . atorvastatin  80 mg Oral Daily  . furosemide  20 mg Intravenous BID  . influenza vac split quadrivalent PF  0.5 mL Intramuscular Tomorrow-1000  . isosorbide mononitrate  15 mg Oral Daily  . metoprolol tartrate  25 mg Oral BID  . pantoprazole (PROTONIX) IV  40 mg Intravenous Q12H  . potassium chloride  40 mEq Oral BID  . sodium chloride flush  3 mL Intravenous Q12H  . sodium chloride flush  3 mL Intravenous Q12H  . spironolactone  25 mg Oral Daily   Continuous Infusions: . sodium chloride    . heparin     PRN Meds:.sodium chloride, acetaminophen, ALPRAZolam, hydrALAZINE, labetalol, morphine injection, nitroGLYCERIN, ondansetron (ZOFRAN) IV, sodium chloride flush, zolpidem   Assessment: Active Problems:   NSTEMI (non-ST  elevated myocardial infarction) ([place]) Anemia   Plan: As per cardiology, endoscopy can be done after patient is euvolemic as an inpatient or outpatient Patient status post cardiac catheterization today  PPI IV twice daily  Continue serial CBCs and transfuse PRN Avoid NSAIDs Maintain 2 large-bore IV lines Please page GI with any acute hemodynamic changes, or signs of active GI bleeding  Timing of endoscopy as an inpatient or outpatient to be determined based on clinical status, and in discussion with cardiology and primary team   LOS: 2 days   Vonda Antigua, MD 09/02/2020, 1:04 PM

## 2020-09-02 NOTE — Progress Notes (Addendum)
Progress Note  Patient Name: Brenda Todd Date of Encounter: 09/02/2020  [facility] Cardiologist: Thurston Hole  Subjective   Patient continues to have chest pressure as well as some jaw soreness (attributes jaw discomfort to bruxism).  No further BM's.  No bleeding/melena.  Inpatient Medications    Scheduled Meds: . aspirin  81 mg Oral Daily  . [START ON 09/03/2020] aspirin  81 mg Oral Pre-Cath  . atorvastatin  80 mg Oral Daily  . influenza vac split quadrivalent PF  0.5 mL Intramuscular Tomorrow-1000  . metoprolol tartrate  25 mg Oral BID  . pantoprazole (PROTONIX) IV  40 mg Intravenous Q12H  . potassium chloride  40 mEq Oral BID  . sodium chloride flush  3 mL Intravenous Q12H   Continuous Infusions: . sodium chloride 100 mL/hr at 09/02/20 0711  . sodium chloride    . sodium chloride    . heparin 1,600 Units/hr (09/02/20 0711)  . nitroGLYCERIN Stopped (09/01/20 0530)   PRN Meds: sodium chloride, acetaminophen, ALPRAZolam, morphine injection, nitroGLYCERIN, ondansetron (ZOFRAN) IV, sodium chloride flush, zolpidem   Vital Signs    Vitals:   09/01/20 1729 09/01/20 2157 09/02/20 0548 09/02/20 0549  BP: (!) 150/81 (!) 176/98 (!) 155/108 (!) 151/92  Pulse: 81 84 92 84  Resp: 18 17 20    Temp: 98.6 F (37 C) 98.6 F (37 C) 99.5 F (37.5 C)   TempSrc: Oral Oral Oral   SpO2: 97% 100% 99%   Weight:      Height:        Intake/Output Summary (Last 24 hours) at 09/02/2020 0800 Last data filed at 09/02/2020 0711 Gross per 24 hour  Intake 3419.25 ml  Output --  Net 3419.25 ml   Last 3 Weights 09/01/2020 08/31/2020 01/24/2020  Weight (lbs) 193 lb 9.6 oz 193 lb 193 lb  Weight (kg) 87.816 kg 87.544 kg 87.544 kg      Telemetry    Normal sinus rhythm.  - Personally Reviewed  ECG    No new tracing  Physical Exam   GEN: No acute distress.   Neck: No JVD Cardiac: RRR, no murmurs, rubs, or gallops.  Respiratory: Clear to auscultation bilaterally. GI: Soft,  nontender, non-distended  MS: No edema; No deformity. Neuro:  Nonfocal  Psych: Normal affect   Labs    High Sensitivity Troponin:   Recent Labs  Lab 08/31/20 1900 08/31/20 2056 09/01/20 0940 09/01/20 1217 09/01/20 1413  TROPONINIHS 230* 263* 331* 306* 247*      Chemistry Recent Labs  Lab 08/31/20 1900 09/01/20 0446 09/02/20 0609  NA 138 140 137  K 3.2* 3.4* 3.1*  CL 105 106 104  CO2 24 27 24   GLUCOSE 101* 106* 98  BUN 17 14 8   CREATININE 0.73 0.72 0.61  CALCIUM 8.6* 8.2* 8.1*  GFRNONAA >60 >60 >60  GFRAA >60 >60 >60  ANIONGAP 9 7 9      Hematology Recent Labs  Lab 08/31/20 1900 08/31/20 1900 09/01/20 0446 09/01/20 1217 09/02/20 0609  WBC 12.1*  --  11.3*  --  10.9*  RBC 3.72*   < > 3.47* 3.71* 3.74*  HGB 10.0*  --  9.3*  --  10.2*  HCT 30.6*  --  28.7*  --  30.1*  MCV 82.3  --  82.7  --  80.5  MCH 26.9  --  26.8  --  27.3  MCHC 32.7  --  32.4  --  33.9  RDW 15.4  --  15.5  --  15.7*  PLT 249  --  236  --  212   < > = values in this interval not displayed.    BNPNo results for input(s): BNP, PROBNP in the last 168 hours.   DDimer No results for input(s): DDIMER in the last 168 hours.   Radiology    DG Chest 2 View  Result Date: 08/31/2020 CLINICAL DATA:  Chest pain. EXAM: CHEST - 2 VIEW COMPARISON:  01/24/2020 FINDINGS: The heart size and mediastinal contours are within normal limits. Both lungs are clear. The visualized skeletal structures are unremarkable. IMPRESSION: No active cardiopulmonary disease. Electronically Signed   By: Constance Holster M.D.   On: 08/31/2020 19:16   CT Head Wo Contrast  Result Date: 08/31/2020 CLINICAL DATA:  Cerebral hemorrhage suspected Emesis.  Episode of slurred speech. EXAM: CT HEAD WITHOUT CONTRAST TECHNIQUE: Contiguous axial images were obtained from the base of the skull through the vertex without intravenous contrast. COMPARISON:  Head CT 05/18/2016 FINDINGS: Brain: No intracranial hemorrhage, mass effect, or  midline shift. No hydrocephalus. The basilar cisterns are patent. No evidence of territorial infarct or acute ischemia. No extra-axial or intracranial fluid collection. Vascular: No hyperdense vessel or unexpected calcification. Skull: Normal. Negative for fracture or focal lesion. Sinuses/Orbits: Paranasal sinuses and mastoid air cells are clear. The visualized orbits are unremarkable. Other: None. IMPRESSION: Negative noncontrast head CT. Electronically Signed   By: Keith Rake M.D.   On: 08/31/2020 20:37   ECHOCARDIOGRAM COMPLETE  Result Date: 09/01/2020    ECHOCARDIOGRAM REPORT   Patient Name:   TIAH HECKEL Date of Exam: 09/01/2020 Medical Rec #:  [id number]       Height:       63.0 in Accession #:    [id number]      Weight:       193.0 lb Date of Birth:  Nov 18, 1981        BSA:          1.905 m Patient Age:    39 years        BP:           127/68 mmHg Patient Gender: F               HR:           92 bpm. Exam Location:  [facility] Procedure: 2D Echo Indications:     NSTEMI I21.4  History:         Patient has no prior history of Echocardiogram examinations.                  Previous Myocardial Infarction; Risk Factors:Hypertension.  Sonographer:     L Thornton-Maynard Referring Phys:  [id number] [place] Diagnosing Phys: Kathlyn Sacramento MD IMPRESSIONS  1. Left ventricular ejection fraction, by estimation, is 55 to 60%. The left ventricle has normal function. The left ventricle has no regional wall motion abnormalities. There is moderate left ventricular hypertrophy. Left ventricular diastolic parameters are consistent with Grade II diastolic dysfunction (pseudonormalization).  2. Right ventricular systolic function is normal. The right ventricular size is normal. There is mildly elevated pulmonary artery systolic pressure.  3. Left atrial size was moderately dilated.  4. The mitral valve is normal in structure. Mild mitral valve regurgitation. No evidence of mitral stenosis.  5. The aortic valve is normal in  structure. Aortic valve regurgitation is not visualized. No aortic stenosis is present.  6. The inferior vena cava is normal in size with <50% respiratory variability, suggesting  right atrial pressure of 8 mmHg. FINDINGS  Left Ventricle: Left ventricular ejection fraction, by estimation, is 55 to 60%. The left ventricle has normal function. The left ventricle has no regional wall motion abnormalities. The left ventricular internal cavity size was normal in size. There is  moderate left ventricular hypertrophy. Left ventricular diastolic parameters are consistent with Grade II diastolic dysfunction (pseudonormalization). Right Ventricle: The right ventricular size is normal. No increase in right ventricular wall thickness. Right ventricular systolic function is normal. There is mildly elevated pulmonary artery systolic pressure. The tricuspid regurgitant velocity is 2.99  m/s, and with an assumed right atrial pressure of 8 mmHg, the estimated right ventricular systolic pressure is 00.7 mmHg. Left Atrium: Left atrial size was moderately dilated. Right Atrium: Right atrial size was normal in size. Pericardium: There is no evidence of pericardial effusion. Mitral Valve: The mitral valve is normal in structure. Mild mitral valve regurgitation. No evidence of mitral valve stenosis. Tricuspid Valve: The tricuspid valve is normal in structure. Tricuspid valve regurgitation is mild . No evidence of tricuspid stenosis. Aortic Valve: The aortic valve is normal in structure. Aortic valve regurgitation is not visualized. No aortic stenosis is present. Aortic valve mean gradient measures 5.0 mmHg. Aortic valve peak gradient measures 7.0 mmHg. Aortic valve area, by VTI measures 2.10 cm. Pulmonic Valve: The pulmonic valve was normal in structure. Pulmonic valve regurgitation is mild. No evidence of pulmonic stenosis. Aorta: The aortic root is normal in size and structure. Venous: The inferior vena cava is normal in size with less  than 50% respiratory variability, suggesting right atrial pressure of 8 mmHg. IAS/Shunts: No atrial level shunt detected by color flow Doppler.  LEFT VENTRICLE PLAX 2D LVIDd:         4.82 cm  Diastology LVIDs:         3.45 cm  LV e' medial:    7.94 cm/s LV PW:         1.35 cm  LV E/e' medial:  14.4 LV IVS:        1.52 cm  LV e' lateral:   7.18 cm/s LVOT diam:     1.80 cm  LV E/e' lateral: 15.9 LV SV:         53 LV SV Index:   28 LVOT Area:     2.54 cm  RIGHT VENTRICLE RV S prime:     10.60 cm/s TAPSE (M-mode): 2.8 cm LEFT ATRIUM             Index LA diam:        3.20 cm 1.68 cm/m LA Vol (A2C):   86.0 ml 45.15 ml/m LA Vol (A4C):   93.9 ml 49.30 ml/m LA Biplane Vol: 89.3 ml 46.89 ml/m  AORTIC VALVE                    PULMONIC VALVE AV Area (Vmax):    2.14 cm     PV Vmax:       0.99 m/s AV Area (Vmean):   1.85 cm     PV Peak grad:  3.9 mmHg AV Area (VTI):     2.10 cm AV Vmax:           132.00 cm/s AV Vmean:          104.000 cm/s AV VTI:            0.251 m AV Peak Grad:      7.0 mmHg AV Mean Grad:  5.0 mmHg LVOT Vmax:         111.00 cm/s LVOT Vmean:        75.600 cm/s LVOT VTI:          0.207 m LVOT/AV VTI ratio: 0.82  AORTA Ao Root diam: 3.00 cm MITRAL VALVE                TRICUSPID VALVE MV Area (PHT): 2.86 cm     TR Peak grad:   35.8 mmHg MV E velocity: 114.00 cm/s  TR Vmax:        299.00 cm/s MV A velocity: 49.00 cm/s MV E/A ratio:  2.33         SHUNTS                             Systemic VTI:  0.21 m                             Systemic Diam: 1.80 cm Kathlyn Sacramento MD Electronically signed by Kathlyn Sacramento MD Signature Date/Time: 09/01/2020/12:02:56 PM    Final    CT Angio Chest/Abd/Pel for Dissection W and/or Wo Contrast  Result Date: 08/31/2020 CLINICAL DATA:  Jaw pain.  Emesis. EXAM: CT ANGIOGRAPHY CHEST, ABDOMEN AND PELVIS TECHNIQUE: Non-contrast CT of the chest was initially obtained. Multidetector CT imaging through the chest, abdomen and pelvis was performed using the standard protocol  during bolus administration of intravenous contrast. Multiplanar reconstructed images and MIPs were obtained and reviewed to evaluate the vascular anatomy. CONTRAST:  150mL OMNIPAQUE IOHEXOL 350 MG/ML SOLN COMPARISON:  CT dated 01/25/2020 FINDINGS: CTA CHEST FINDINGS Cardiovascular: There is no evidence for thoracic aortic aneurysm or dissection. There is no evidence for an acute pulmonary embolism. The heart size is unremarkable. There is no significant pericardial effusion. Mediastinum/Nodes: -- No mediastinal lymphadenopathy. -- No hilar lymphadenopathy. -- No axillary lymphadenopathy. -- No supraclavicular lymphadenopathy. -- Normal thyroid gland where visualized. -  Unremarkable esophagus. Lungs/Pleura: Airways are patent. No pleural effusion, lobar consolidation, pneumothorax or pulmonary infarction. Musculoskeletal: No chest wall abnormality. No bony spinal canal stenosis. Review of the MIP images confirms the above findings. CTA ABDOMEN AND PELVIS FINDINGS VASCULAR Aorta: Normal caliber aorta without aneurysm, dissection, vasculitis or significant stenosis. Celiac: Patent without evidence of aneurysm, dissection, vasculitis or significant stenosis. SMA: Patent without evidence of aneurysm, dissection, vasculitis or significant stenosis. Renals: Both renal arteries are patent without evidence of aneurysm, dissection, vasculitis, fibromuscular dysplasia or significant stenosis. IMA: Patent without evidence of aneurysm, dissection, vasculitis or significant stenosis. Inflow: Patent without evidence of aneurysm, dissection, vasculitis or significant stenosis. Veins: No obvious venous abnormality within the limitations of this arterial phase study. Review of the MIP images confirms the above findings. NON-VASCULAR Hepatobiliary: The liver is normal. Normal gallbladder.There is no biliary ductal dilation. Pancreas: Normal contours without ductal dilatation. No peripancreatic fluid collection. Spleen: Unremarkable.  Adrenals/Urinary Tract: --Adrenal glands: Unremarkable. --Right kidney/ureter: No hydronephrosis or radiopaque kidney stones. --Left kidney/ureter: No hydronephrosis or radiopaque kidney stones. --Urinary bladder: Unremarkable. Stomach/Bowel: --Stomach/Duodenum: No hiatal hernia or other gastric abnormality. Normal duodenal course and caliber. --Small bowel: Unremarkable. --Colon: Unremarkable. --Appendix: Not visualized. No right lower quadrant inflammation or free fluid. Lymphatic: --No retroperitoneal lymphadenopathy. --No mesenteric lymphadenopathy. --No pelvic or inguinal lymphadenopathy. Reproductive: There is a new cystic ovarian mass involving the right ovary measuring 5.2 cm (axial series 6, image 159). The left ovary is  unremarkable. Other: No ascites or free air. The abdominal wall is normal. Musculoskeletal. No acute displaced fractures. Review of the MIP images confirms the above findings. IMPRESSION: 1. No acute thoracic, abdominal or pelvic pathology. Specifically, there is no evidence for an acute pulmonary embolism. 2. 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 Recommend follow-up ultrasound in 6-12 months to confirm resolution. Electronically Signed   By: Constance Holster M.D.   On: 08/31/2020 20:29    Cardiac Studies   TTE (09/01/2020): 1. Left ventricular ejection fraction, by estimation, is 55 to 60%. The  left ventricle has normal function. The left ventricle has no regional  wall motion abnormalities. There is moderate left ventricular hypertrophy.  Left ventricular diastolic  parameters are consistent with Grade II diastolic dysfunction  (pseudonormalization).  2. Right ventricular systolic function is normal. The right ventricular  size is normal. There is mildly elevated pulmonary artery systolic  pressure.  3. Left atrial size was moderately dilated.  4. The mitral valve is normal in structure. Mild mitral valve  regurgitation. No evidence of  mitral stenosis.  5. The aortic valve is normal in structure. Aortic valve regurgitation is  not visualized. No aortic stenosis is present.  6. The inferior vena cava is normal in size with <50% respiratory  variability, suggesting right atrial pressure of 8 mmHg.   Patient Profile     39 y.o. female with h/o CAD s/p PCI x 2 in 2018, nephrolithiasis, hypertension, and hyperlipidemia, admitted withi NSTEMI and recent melena with iron deficiency anemia.  Assessment & Plan    NSTEMI: Chest pressure persists.  Troponin downtrending.  Given continued chest discomfort and elevated HS-TnI, we will proceed with LHC and possible PCI this AM.  I have reviewed the risks, indications, and alternatives to cardiac catheterization, possible angioplasty, and stenting with the patient. Risks include but are not limited to bleeding, infection, vascular injury, stroke, myocardial infection, arrhythmia, kidney injury, radiation-related injury in the case of prolonged fluoroscopy use, emergency cardiac surgery, and death. The patient understands the risks of serious complication is 1-2 in [id number] with diagnostic cardiac cath and 1-2% or less with angioplasty/stenting.  Given h/o clopidogrel intolerance, we would need to use ticagrelor and aspirin for antiplatelet therapy.  Continue atorvastatin.  Continue metoprolol tartrate 25 mg BID.  Iron deficiency anemia and melena: Suspicious for upper GI bleed.  No further BM/melena for 2-3 days.  Hemoglobin stable.  Patient seen by GI yesterday; appreciate recommendations.  Given lack of further melena and improved hemoglobin today, I feel it would be best to define coronary anatomy before proceeding with endoscopy.  Ongoing management per GI and IM.  Continue IV pantoprazole.  Defer endoscopy pending catheterization results.  Hyperlipidemia:  Continue atorvastatin 80 mg daily.  ADDENDUM (09/02/20 @ 8:32 PM): Catheterization today showed 99% stenosis involving  small OM2 branch.  Otherwise, no obstructive CAD noted.  rPDA and rPL stents are widely patent.  LVEDP severely elevated.  I recommend medical therapy, including completion of 48 hours of IV heparin, as well as diuresis.  Once patient is euvolemic, further GI evaluation, including endoscopy, can be performed.  Recommend 12 months of DAPT once cleared by GI.  For questions or updates, please contact [place] Please consult [email or web address] for contact info under [facility].     Signed, Nelva Bush, MD  09/02/2020, 8:00 AM

## 2020-09-02 NOTE — Plan of Care (Signed)
  Problem: Pain Managment: Goal: General experience of comfort will improve Outcome: Progressing  No voiced complaints of pain.

## 2020-09-02 NOTE — Progress Notes (Addendum)
ANTICOAGULATION CONSULT NOTE  Pharmacy Consult for Heparin Drip Indication: chest pain/ACS s/p LHC  Allergies  Allergen Reactions  . No [employer] Food Allergies Anaphylaxis and Other (See Comments)    Cherries  . Penicillins Anaphylaxis    Patient Measurements: Height: 5\' 3"  (160 cm) Weight: 87.8 kg (193 lb 9 oz) IBW/kg (Calculated) : 52.4 Heparin Dosing Weight: 72.1 kg  Vital Signs: Temp: 99 F (37.2 C) (09/28 1140) Temp Source: Oral (09/28 1140) BP: 140/80 (09/28 1140) Pulse Rate: 82 (09/28 1100)  Labs: Recent Labs    08/31/20 1900 08/31/20 2056 09/01/20 0446 09/01/20 0446 09/01/20 0940 09/01/20 1217 09/01/20 1413 09/01/20 2150 09/02/20 0609  HGB 10.0*   < > 9.3*  --   --   --   --   --  10.2*  HCT 30.6*  --  28.7*  --   --   --   --   --  30.1*  PLT 249  --  236  --   --   --   --   --  212  APTT 27  --   --   --   --   --   --   --   --   LABPROT 12.5  --   --   --   --   --   --   --   --   INR 1.0  --   --   --   --   --   --   --   --   HEPARINUNFRC  --   --  <0.10*   < >  --  <0.10*  --  0.14* 0.39  CREATININE 0.73  --  0.72  --   --   --   --   --  0.61  TROPONINIHS 230*   < >  --   --  331* 306* 247*  --   --    < > = values in this interval not displayed.    Estimated Creatinine Clearance: 99.3 mL/min (by C-G formula based on SCr of 0.61 mg/dL).   Medical History: Past Medical History:  Diagnosis Date  . Chronic hypertension   . Kidney calculi   . UTI (lower urinary tract infection)    Assessment: Patient is a 39yo female presenting with chest pain with PMH significant for HTN, CAD s/p PCI and 2 stents (2018), and nephrolithiasis and UTI. Per chart review, No prior anticoagulants listed in home meds.Troponin 230>263 peaked at 331. EKG showed sinus rhythm with no significant ST or T wave changes. Chest CTA revealed no evidence of PE. Echo with LVEF 95-62%; LV diastolic consistent with Grade II diastolic dysfunction. Pharmacy consulted for  restarting Heparin 2 hours after TR band removal s/p LHC 9/28.   Confirmed with nurse that previous heparin drip was stopped prior to procedure.  TR band removed 9/28 @ 1115 Patient received 4500 units heparin @0913  during the procedure  Hgb 10>9.3>10.2 Plt 130>865>784  9/27 1217 HL:  <0.1 9/27 2150 HL: 0.14 9/28 0609 HL: 0.39   Goal of Therapy:  Heparin level 0.3-0.7 units/ml Monitor platelets by anticoagulation protocol: Yes   Plan:  --Will not bolus as patient is s/p LHC and received heparin during procedure --Will restart heparin at previous rate at 1600 units/hr --Check anti-Xa level in 6 hours --Continue to monitor H&H and platelets daily  Sherilyn Banker, PharmD Pharmacy Resident  09/02/2020 11:55 AM

## 2020-09-02 NOTE — Interval H&P Note (Signed)
History and Physical Interval Note:  09/02/2020 8:46 AM  Brenda Todd  has presented today for surgery, with the diagnosis of NSTEMI.  The various methods of treatment have been discussed with the patient and family. After consideration of risks, benefits and other options for treatment, the patient has consented to  Procedure(s): LEFT HEART CATH AND CORONARY ANGIOGRAPHY (N/A) as a surgical intervention.  The patient's history has been reviewed, patient examined, no change in status, stable for surgery.  I have reviewed the patient's chart and labs.  Questions were answered to the patient's satisfaction.    Cath Lab Visit (complete for each Cath Lab visit)  Clinical Evaluation Leading to the Procedure:   ACS: Yes.    Non-ACS:    Harrell Gave Khaliel Morey

## 2020-09-02 NOTE — Progress Notes (Signed)
[place] for Heparin Drip Indication: chest pain/ACS  Allergies  Allergen Reactions  . No [employer] Food Allergies Anaphylaxis and Other (See Comments)    Cherries  . Penicillins Anaphylaxis    Patient Measurements: Height: 5\' 3"  (160 cm) Weight: 87.8 kg (193 lb 9.6 oz) IBW/kg (Calculated) : 52.4 Heparin Dosing Weight: 72.1 kg  Vital Signs: Temp: 99.5 F (37.5 C) (09/28 0548) Temp Source: Oral (09/28 0548) BP: 151/92 (09/28 0549) Pulse Rate: 84 (09/28 0549)  Labs: Recent Labs    08/31/20 1900 08/31/20 2056 09/01/20 0446 09/01/20 0446 09/01/20 0940 09/01/20 1217 09/01/20 1413 09/01/20 2150 09/02/20 0609  HGB 10.0*   < > 9.3*  --   --   --   --   --  10.2*  HCT 30.6*  --  28.7*  --   --   --   --   --  30.1*  PLT 249  --  236  --   --   --   --   --  212  APTT 27  --   --   --   --   --   --   --   --   LABPROT 12.5  --   --   --   --   --   --   --   --   INR 1.0  --   --   --   --   --   --   --   --   HEPARINUNFRC  --   --  <0.10*   < >  --  <0.10*  --  0.14* 0.39  CREATININE 0.73  --  0.72  --   --   --   --   --  0.61  TROPONINIHS 230*   < >  --   --  331* 306* 247*  --   --    < > = values in this interval not displayed.    Estimated Creatinine Clearance: 99.3 mL/min (by C-G formula based on SCr of 0.61 mg/dL).   Medical History: Past Medical History:  Diagnosis Date  . Chronic hypertension   . Kidney calculi   . UTI (lower urinary tract infection)    Assessment: Patient is a 39yo female presenting with chest pain with PMH significant for HTN, CAD s/p PCI and 2 stents (2018), and nephrolithiasis and UTI. Per chart review, No prior anticoagulants listed in home meds.Troponin 230>263 peaked at 331. EKG showed sinus rhythm with no significant ST or T wave changes. Chest CTA revealed no evidence of PE. Echo with LVEF 22-02%; LV diastolic consistent with Grade II diastolic dysfunction. Pharmacy consulted for Heparin  dosing and monitoring.   Hgb 10>9.3 Plt 249>236  9/27 1217 HL:  <0.1 9/27  2150 HL: 0.14 9/28 0609 HL 0.39, therapeutic x 1, CBC is stable  Goal of Therapy:  Heparin level 0.3-0.7 units/ml Monitor platelets by anticoagulation protocol: Yes   Plan:  --HL is therapeutic x 1, will continue Heparin at 1600 units/hr --Check anti-Xa level in 6 hours to confirm --Continue to monitor H&H and platelets   Hart Robinsons A 09/02/2020 7:00 AM

## 2020-09-02 NOTE — Progress Notes (Signed)
Nutrition Brief Note  Patient identified on the Malnutrition Screening Tool (MST) Report for weight loss  Wt Readings from Last 15 Encounters:  09/02/20 87.8 kg  01/24/20 87.5 kg  05/18/16 73.9 kg  05/03/16 73.9 kg  02/06/16 79.4 kg  12/08/15 88.5 kg  10/11/15 82.6 kg  08/06/15 78 kg  07/13/15 79.8 kg  07/10/15 77.2 kg  05/21/15 73.5 kg    Body mass index is 34.29 kg/m. Patient meets criteria for obese based on current BMI.   Labs and medications reviewed.   No nutrition interventions warranted at this time. If nutrition issues arise, please consult RD.   Lajuan Lines, RD, LDN Clinical Nutrition After Hours/Weekend Pager # [phone]

## 2020-09-02 NOTE — Progress Notes (Addendum)
PROGRESS NOTE    Brenda Todd   UXL:[id number]  DOB: 1981-04-02  PCP: Patient, No Pcp Per    DOA: 08/31/2020 LOS: 2   Brief Narrative   Brenda Todd  is a 39 y.o. female with a history of hypertension, CAD s/p PCI x2, nephrolithiasis and UTI, presented to the ED on evening of 08/31/20 with midsternal chest pain graded 8/10 in severity radiating from her left shoulder and described as somebody sitting on her chest with associated left jaw tightness.  Associated nausea vomiting and diaphoresis.  Recent intermittent abdominal pain secondary to kidney stone actively passing, no dysuria.  Reported 2 episodes of dark/black stool in past couple days and takes ibuprofen daily.  In the ED,  BP uncontrolled 194/106 with otherwise normal vital signs.   Notable labs: troponin 230>>263, K 3.2, WBC 12.1k and mild anemia.   CTA chest ruled out PE. 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 ECG normal sinus 93 bpm without acute ischemic changes and repeat later was similar. Qtc prolonged at 555.  Started on heparin bolus>>infusion, BP treated with IV labetalol, and given morphine and SL nitro, later nitro infusion.  Admitted to hospitalist service with cardiology consulted.      Assessment & Plan   Active Problems:   NSTEMI (non-ST elevated myocardial infarction) (HCC)   Non-STEMI / Elevated Troponin / Chest Pain - present on admission Hx of CAD with prior PCIx2 in 2018 -  Echo 9/27 - EF 60-65%, grade II diastolic dysfunction --Cardiology consulted ([facility]) --on Heparin gtt --Continue ASA, Lipitor, Lopressor, PRN nitro SL --Imdur, spironolactone added --Diuresis: IV Lasix 20 mg BID --Has been off Plavix for past year, and Lipitor due to cost --Trend troponin until down-trending --Echo pending - follow up -- [facility] consulted for any medication assistance we can offer.   Melena - 2 episodes per pt in couple days before  admission.  No BM since admission. Acute Anemia, suspect due to blood loss - presented with Hbg 10.0 >> 9.3 this AM (down from 12.7, 13.9 in Feb this year).  Given recent history of melena and daily ibuprofen use, suspect upper GI bleeding. --Anemia panel reflect iron deficiency (given monitoring for melena, hold off on PO iron for now) --Venofer infusion this afternoon --FOBT pending (no BM yet) --GI consulted  --Trend H&H --Transfuse pRBC's as needed, goal of Hbg > 8.0 per cardiology --Endoscopy in vs outpatient depending on Hbg trens   Hypokalemia - replaced.  Monitor BMP.  Replace K as needed for goal > 4.0.   Migraine headache - hx of migraines, says best relief at home with Benadryl and pain med.   --Tramadol PRN and Benadryl IV once.   Hx of kidney stones - reports actively passing stone ?Hematuria - pt reported orange/red urine at home.  Ccheck UA.  Will consider urology consult.  Monitor.    Accelerated Hypertension - present on admission BP 194/106, possibly contributed to chest pain and troponin elevation. Essential Hypertension - continue Lopressor.  Consider ACEI/ARB before d/c if renal function tolerates.  Goal BP <130/80.   Dyslipidemia / Hypertriglyceridemia - TG's 250, LDL 95, HDL 35.  Continue Lipitor.    Ovarian Mass - incidental finding on CT.  Follow up ultrasound recommended in 6-12 months as outpatient.   Obesity: Body mass index is 34.29 kg/m.  Complicates overall care and prognosis.  Pt counseled of diet and exercise for weight loss to  benefit overall health.   DVT prophylaxis: on heparin gtt   Diet:  Diet Orders (From admission, onward)    Start     Ordered   09/02/20 1022  Diet Heart Room service appropriate? Yes; Fluid consistency: Thin  Diet effective now       Question Answer Comment  Room service appropriate? Yes   Fluid consistency: Thin      09/02/20 1021            Code Status: Full Code    Subjective 09/02/20    Patient seen  after cath today.  Reports a bad headache, feels like migraine.  Chest discomfort still present but improving.   No other complaints.  Disposition Plan & Communication   Status is: Inpatient  Remains inpatient appropriate because: severity of illness requiring IV therapies as above.     Dispo: The patient is from: Home              Anticipated d/c is to: Home              Anticipated d/c date is: 1-2 days              Patient currently is not medically stable to d/c.    Family Communication: none at bedside, will attempt to call    Consults, Procedures, Significant Events   Consultants:   Cardiology  Gastroenterology  Procedures:  9/28 Cardiac Cath -  Conclusions: 1. Severe single-vessel coronary artery disease; culprit lesion appears to be 99% stenosis in small OM2 branch (vessel is at most 2 mm in diameter).  Mild to moderate, non-obstructive CAD involving the LCx and RCA. 2. Widely patent rPDA and rPL1 stents. 3. Severely elevated left ventricular filling pressure (LVEDP 35-40 mmHg).   Antimicrobials:  Anti-infectives (From admission, onward)   None        Objective   Vitals:   09/02/20 1030 09/02/20 1045 09/02/20 1100 09/02/20 1140  BP: (!) 150/91 137/80 (!) 148/86 140/80  Pulse: 80 82 82   Resp: (!) 30 (!) 26 (!) 26 18  Temp:    99 F (37.2 C)  TempSrc:    Oral  SpO2: 94% 94% 96%   Weight:      Height:        Intake/Output Summary (Last 24 hours) at 09/02/2020 1413 Last data filed at 09/02/2020 1345 Gross per 24 hour  Intake 3659.25 ml  Output 2000 ml  Net 1659.25 ml   Filed Weights   08/31/20 1858 09/01/20 0804 09/02/20 0836  Weight: 87.5 kg 87.8 kg 87.8 kg    Physical Exam:  General exam: awake, alert, no acute distress, obese Respiratory system: CTAB, normal respiratory effort. Cardiovascular system: normal S1/S2, RRR Central nervous system: A&O x4. no gross focal neurologic deficits, normal speech Extremities: moves all, no cyanosis,  normal tone   Labs   Data Reviewed: I have personally reviewed following labs and imaging studies  CBC: Recent Labs  Lab 08/31/20 1900 09/01/20 0446 09/02/20 0609  WBC 12.1* 11.3* 10.9*  HGB 10.0* 9.3* 10.2*  HCT 30.6* 28.7* 30.1*  MCV 82.3 82.7 80.5  PLT 249 236 834   Basic Metabolic Panel: Recent Labs  Lab 08/31/20 1900 09/01/20 0446 09/02/20 0609  NA 138 140 137  K 3.2* 3.4* 3.1*  CL 105 106 104  CO2 24 27 24   GLUCOSE 101* 106* 98  BUN 17 14 8   CREATININE 0.73 0.72 0.61  CALCIUM 8.6* 8.2* 8.1*  MG  --  2.1  --    GFR: Estimated Creatinine Clearance: 99.3 mL/min (by C-G formula based on SCr of 0.61 mg/dL). Liver Function Tests: No results for input(s): AST, ALT, ALKPHOS, BILITOT, PROT, ALBUMIN in the last 168 hours. No results for input(s): LIPASE, AMYLASE in the last 168 hours. No results for input(s): AMMONIA in the last 168 hours. Coagulation Profile: Recent Labs  Lab 08/31/20 1900  INR 1.0   Cardiac Enzymes: No results for input(s): CKTOTAL, CKMB, CKMBINDEX, TROPONINI in the last 168 hours. BNP (last 3 results) No results for input(s): PROBNP in the last 8760 hours. HbA1C: Recent Labs    09/02/20 0609  HGBA1C 5.6   CBG: No results for input(s): GLUCAP in the last 168 hours. Lipid Profile: Recent Labs    09/01/20 0446  CHOL 180  HDL 35*  LDLCALC 95  TRIG 250*  CHOLHDL 5.1   Thyroid Function Tests: No results for input(s): TSH, T4TOTAL, FREET4, T3FREE, THYROIDAB in the last 72 hours. Anemia Panel: Recent Labs    09/01/20 1217  VITAMINB12 286  FOLATE 23.0  FERRITIN 25  TIBC 343  IRON 22*  RETICCTPCT 3.4*   Sepsis Labs: No results for input(s): PROCALCITON, LATICACIDVEN in the last 168 hours.  Recent Results (from the past 240 hour(s))  Respiratory Panel by RT PCR (Flu A&B, Covid) - Nasopharyngeal Swab     Status: None   Collection Time: 08/31/20 10:38 PM   Specimen: Nasopharyngeal Swab  Result Value Ref Range Status   SARS  Coronavirus 2 by RT PCR NEGATIVE NEGATIVE Final    Comment: (NOTE) SARS-CoV-2 target nucleic acids are NOT DETECTED.  The SARS-CoV-2 RNA is generally detectable in upper respiratoy specimens during the acute phase of infection. The lowest concentration of SARS-CoV-2 viral copies this assay can detect is 131 copies/mL. A negative result does not preclude SARS-Cov-2 infection and should not be used as the sole basis for treatment or other patient management decisions. A negative result may occur with  improper specimen collection/handling, submission of specimen other than nasopharyngeal swab, presence of viral mutation(s) within the areas targeted by this assay, and inadequate number of viral copies (<131 copies/mL). A negative result must be combined with clinical observations, patient history, and epidemiological information. The expected result is Negative.  Fact Sheet for Patients:  PinkCheek.be  Fact Sheet for Healthcare Providers:  GravelBags.it  This test is no t yet approved or cleared by the [place] FDA and  has been authorized for detection and/or diagnosis of SARS-CoV-2 by FDA under an Emergency Use Authorization (EUA). This EUA will remain  in effect (meaning this test can be used) for the duration of the COVID-19 declaration under Section 564(b)(1) of the Act, 21 U.S.C. section 360bbb-3(b)(1), unless the authorization is terminated or revoked sooner.     Influenza A by PCR NEGATIVE NEGATIVE Final   Influenza B by PCR NEGATIVE NEGATIVE Final    Comment: (NOTE) The Xpert Xpress SARS-CoV-2/FLU/RSV assay is intended as an aid in  the diagnosis of influenza from Nasopharyngeal swab specimens and  should not be used as a sole basis for treatment. Nasal washings and  aspirates are unacceptable for Xpert Xpress SARS-CoV-2/FLU/RSV  testing.  Fact Sheet for  Patients: PinkCheek.be  Fact Sheet for Healthcare Providers: GravelBags.it  This test is not yet approved or cleared by the [place] FDA and  has been authorized for detection and/or diagnosis of SARS-CoV-2 by  FDA under an Emergency Use Authorization (EUA). This EUA will remain  in  effect (meaning this test can be used) for the duration of the  Covid-19 declaration under Section 564(b)(1) of the Act, 21  U.S.C. section 360bbb-3(b)(1), unless the authorization is  terminated or revoked. Performed at [facility], [address], [place], [place] [address]       Imaging Studies   DG Chest 2 View  Result Date: 08/31/2020 CLINICAL DATA:  Chest pain. EXAM: CHEST - 2 VIEW COMPARISON:  01/24/2020 FINDINGS: The heart size and mediastinal contours are within normal limits. Both lungs are clear. The visualized skeletal structures are unremarkable. IMPRESSION: No active cardiopulmonary disease. Electronically Signed   By: Constance Holster M.D.   On: 08/31/2020 19:16   CT Head Wo Contrast  Result Date: 08/31/2020 CLINICAL DATA:  Cerebral hemorrhage suspected Emesis.  Episode of slurred speech. EXAM: CT HEAD WITHOUT CONTRAST TECHNIQUE: Contiguous axial images were obtained from the base of the skull through the vertex without intravenous contrast. COMPARISON:  Head CT 05/18/2016 FINDINGS: Brain: No intracranial hemorrhage, mass effect, or midline shift. No hydrocephalus. The basilar cisterns are patent. No evidence of territorial infarct or acute ischemia. No extra-axial or intracranial fluid collection. Vascular: No hyperdense vessel or unexpected calcification. Skull: Normal. Negative for fracture or focal lesion. Sinuses/Orbits: Paranasal sinuses and mastoid air cells are clear. The visualized orbits are unremarkable. Other: None. IMPRESSION: Negative noncontrast head CT. Electronically Signed   By: Keith Rake M.D.    On: 08/31/2020 20:37   CARDIAC CATHETERIZATION  Result Date: 09/02/2020 Conclusions: 1. Severe single-vessel coronary artery disease; culprit lesion appears to be 99% stenosis in small OM2 branch (vessel is at most 2 mm in diameter).  Mild to moderate, non-obstructive CAD involving the LCx and RCA. 2. Widely patent rPDA and rPL1 stents. 3. Severely elevated left ventricular filling pressure (LVEDP 35-40 mmHg). Recommendations: 1. Aggressive medical therapy, given small size of culprit vessel, severely elevated LVEDP, and recent melena. 2. Restart heparin 2 hours after TR band removal, to complete at least 48 hours of IV heparin from admission. 3. Continue aspirin 81 mg daily (if tolerated from a GI standpoint).  Consider adding P2Y12 inhibitor to complete 12 months of DAPT, once cleared by GI. 4. Aggressive diuresis.  Furosemide 20 mg IV x 1 given during procedure.  Additional dosing will likely be required. 5. Aggressive secondary prevention of CAD. Nelva Bush, MD [facility]   ECHOCARDIOGRAM COMPLETE  Result Date: 09/01/2020    ECHOCARDIOGRAM REPORT   Patient Name:   MAKHAYLA MCMURRY Date of Exam: 09/01/2020 Medical Rec #:  [id number]       Height:       63.0 in Accession #:    [id number]      Weight:       193.0 lb Date of Birth:  06-07-1981        BSA:          1.905 m Patient Age:    65 years        BP:           127/68 mmHg Patient Gender: F               HR:           92 bpm. Exam Location:  [facility] Procedure: 2D Echo Indications:     NSTEMI I21.4  History:         Patient has no prior history of Echocardiogram examinations.  Previous Myocardial Infarction; Risk Factors:Hypertension.  Sonographer:     L Thornton-Maynard Referring Phys:  [id number] [place] Diagnosing Phys: Kathlyn Sacramento MD IMPRESSIONS  1. Left ventricular ejection fraction, by estimation, is 55 to 60%. The left ventricle has normal function. The left ventricle has no regional wall motion abnormalities. There is  moderate left ventricular hypertrophy. Left ventricular diastolic parameters are consistent with Grade II diastolic dysfunction (pseudonormalization).  2. Right ventricular systolic function is normal. The right ventricular size is normal. There is mildly elevated pulmonary artery systolic pressure.  3. Left atrial size was moderately dilated.  4. The mitral valve is normal in structure. Mild mitral valve regurgitation. No evidence of mitral stenosis.  5. The aortic valve is normal in structure. Aortic valve regurgitation is not visualized. No aortic stenosis is present.  6. The inferior vena cava is normal in size with <50% respiratory variability, suggesting right atrial pressure of 8 mmHg. FINDINGS  Left Ventricle: Left ventricular ejection fraction, by estimation, is 55 to 60%. The left ventricle has normal function. The left ventricle has no regional wall motion abnormalities. The left ventricular internal cavity size was normal in size. There is  moderate left ventricular hypertrophy. Left ventricular diastolic parameters are consistent with Grade II diastolic dysfunction (pseudonormalization). Right Ventricle: The right ventricular size is normal. No increase in right ventricular wall thickness. Right ventricular systolic function is normal. There is mildly elevated pulmonary artery systolic pressure. The tricuspid regurgitant velocity is 2.99  m/s, and with an assumed right atrial pressure of 8 mmHg, the estimated right ventricular systolic pressure is 32.6 mmHg. Left Atrium: Left atrial size was moderately dilated. Right Atrium: Right atrial size was normal in size. Pericardium: There is no evidence of pericardial effusion. Mitral Valve: The mitral valve is normal in structure. Mild mitral valve regurgitation. No evidence of mitral valve stenosis. Tricuspid Valve: The tricuspid valve is normal in structure. Tricuspid valve regurgitation is mild . No evidence of tricuspid stenosis. Aortic Valve: The aortic  valve is normal in structure. Aortic valve regurgitation is not visualized. No aortic stenosis is present. Aortic valve mean gradient measures 5.0 mmHg. Aortic valve peak gradient measures 7.0 mmHg. Aortic valve area, by VTI measures 2.10 cm. Pulmonic Valve: The pulmonic valve was normal in structure. Pulmonic valve regurgitation is mild. No evidence of pulmonic stenosis. Aorta: The aortic root is normal in size and structure. Venous: The inferior vena cava is normal in size with less than 50% respiratory variability, suggesting right atrial pressure of 8 mmHg. IAS/Shunts: No atrial level shunt detected by color flow Doppler.  LEFT VENTRICLE PLAX 2D LVIDd:         4.82 cm  Diastology LVIDs:         3.45 cm  LV e' medial:    7.94 cm/s LV PW:         1.35 cm  LV E/e' medial:  14.4 LV IVS:        1.52 cm  LV e' lateral:   7.18 cm/s LVOT diam:     1.80 cm  LV E/e' lateral: 15.9 LV SV:         53 LV SV Index:   28 LVOT Area:     2.54 cm  RIGHT VENTRICLE RV S prime:     10.60 cm/s TAPSE (M-mode): 2.8 cm LEFT ATRIUM             Index LA diam:        3.20 cm 1.68 cm/m  LA Vol (A2C):   86.0 ml 45.15 ml/m LA Vol (A4C):   93.9 ml 49.30 ml/m LA Biplane Vol: 89.3 ml 46.89 ml/m  AORTIC VALVE                    PULMONIC VALVE AV Area (Vmax):    2.14 cm     PV Vmax:       0.99 m/s AV Area (Vmean):   1.85 cm     PV Peak grad:  3.9 mmHg AV Area (VTI):     2.10 cm AV Vmax:           132.00 cm/s AV Vmean:          104.000 cm/s AV VTI:            0.251 m AV Peak Grad:      7.0 mmHg AV Mean Grad:      5.0 mmHg LVOT Vmax:         111.00 cm/s LVOT Vmean:        75.600 cm/s LVOT VTI:          0.207 m LVOT/AV VTI ratio: 0.82  AORTA Ao Root diam: 3.00 cm MITRAL VALVE                TRICUSPID VALVE MV Area (PHT): 2.86 cm     TR Peak grad:   35.8 mmHg MV E velocity: 114.00 cm/s  TR Vmax:        299.00 cm/s MV A velocity: 49.00 cm/s MV E/A ratio:  2.33         SHUNTS                             Systemic VTI:  0.21 m                              Systemic Diam: 1.80 cm Kathlyn Sacramento MD Electronically signed by Kathlyn Sacramento MD Signature Date/Time: 09/01/2020/12:02:56 PM    Final    CT Angio Chest/Abd/Pel for Dissection W and/or Wo Contrast  Result Date: 08/31/2020 CLINICAL DATA:  Jaw pain.  Emesis. EXAM: CT ANGIOGRAPHY CHEST, ABDOMEN AND PELVIS TECHNIQUE: Non-contrast CT of the chest was initially obtained. Multidetector CT imaging through the chest, abdomen and pelvis was performed using the standard protocol during bolus administration of intravenous contrast. Multiplanar reconstructed images and MIPs were obtained and reviewed to evaluate the vascular anatomy. CONTRAST:  155mL OMNIPAQUE IOHEXOL 350 MG/ML SOLN COMPARISON:  CT dated 01/25/2020 FINDINGS: CTA CHEST FINDINGS Cardiovascular: There is no evidence for thoracic aortic aneurysm or dissection. There is no evidence for an acute pulmonary embolism. The heart size is unremarkable. There is no significant pericardial effusion. Mediastinum/Nodes: -- No mediastinal lymphadenopathy. -- No hilar lymphadenopathy. -- No axillary lymphadenopathy. -- No supraclavicular lymphadenopathy. -- Normal thyroid gland where visualized. -  Unremarkable esophagus. Lungs/Pleura: Airways are patent. No pleural effusion, lobar consolidation, pneumothorax or pulmonary infarction. Musculoskeletal: No chest wall abnormality. No bony spinal canal stenosis. Review of the MIP images confirms the above findings. CTA ABDOMEN AND PELVIS FINDINGS VASCULAR Aorta: Normal caliber aorta without aneurysm, dissection, vasculitis or significant stenosis. Celiac: Patent without evidence of aneurysm, dissection, vasculitis or significant stenosis. SMA: Patent without evidence of aneurysm, dissection, vasculitis or significant stenosis. Renals: Both renal arteries are patent without evidence of aneurysm, dissection, vasculitis, fibromuscular dysplasia or significant stenosis. IMA: Patent without  evidence of aneurysm, dissection,  vasculitis or significant stenosis. Inflow: Patent without evidence of aneurysm, dissection, vasculitis or significant stenosis. Veins: No obvious venous abnormality within the limitations of this arterial phase study. Review of the MIP images confirms the above findings. NON-VASCULAR Hepatobiliary: The liver is normal. Normal gallbladder.There is no biliary ductal dilation. Pancreas: Normal contours without ductal dilatation. No peripancreatic fluid collection. Spleen: Unremarkable. Adrenals/Urinary Tract: --Adrenal glands: Unremarkable. --Right kidney/ureter: No hydronephrosis or radiopaque kidney stones. --Left kidney/ureter: No hydronephrosis or radiopaque kidney stones. --Urinary bladder: Unremarkable. Stomach/Bowel: --Stomach/Duodenum: No hiatal hernia or other gastric abnormality. Normal duodenal course and caliber. --Small bowel: Unremarkable. --Colon: Unremarkable. --Appendix: Not visualized. No right lower quadrant inflammation or free fluid. Lymphatic: --No retroperitoneal lymphadenopathy. --No mesenteric lymphadenopathy. --No pelvic or inguinal lymphadenopathy. Reproductive: There is a new cystic ovarian mass involving the right ovary measuring 5.2 cm (axial series 6, image 159). The left ovary is unremarkable. Other: No ascites or free air. The abdominal wall is normal. Musculoskeletal. No acute displaced fractures. Review of the MIP images confirms the above findings. IMPRESSION: 1. No acute thoracic, abdominal or pelvic pathology. Specifically, there is no evidence for an acute pulmonary embolism. 2. 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 Recommend follow-up ultrasound in 6-12 months to confirm resolution. Electronically Signed   By: Constance Holster M.D.   On: 08/31/2020 20:29     Medications   Scheduled Meds: . aspirin  81 mg Oral Daily  . atorvastatin  80 mg Oral Daily  . diphenhydrAMINE  25 mg Intravenous Once  . furosemide  20 mg Intravenous BID  .  influenza vac split quadrivalent PF  0.5 mL Intramuscular Tomorrow-1000  . isosorbide mononitrate  15 mg Oral Daily  . metoprolol tartrate  25 mg Oral BID  . pantoprazole (PROTONIX) IV  40 mg Intravenous Q12H  . potassium chloride  40 mEq Oral BID  . sodium chloride flush  3 mL Intravenous Q12H  . sodium chloride flush  3 mL Intravenous Q12H  . spironolactone  25 mg Oral Daily   Continuous Infusions: . sodium chloride    . heparin 1,600 Units/hr (09/02/20 1317)       LOS: 2 days    Time spent: 25 minutes with > 50% spent in coordination of care and direct patient contact    Ezekiel Slocumb, DO [facility]  09/02/2020, 2:13 PM    If 7PM-7AM, please contact night-coverage. How to contact the [facility] Attending or Consulting provider [place] or covering provider during after hours [place], for this patient?    1. Check the care team in [facility] and look for a) attending/consulting [facility] provider listed and b) the [facility] team listed 2. Log into [email or web address] and use [place]'s universal password to access. If you do not have the password, please contact the hospital operator. 3. Locate the [facility] provider you are looking for under [facility] and page to a number that you can be directly reached. 4. If you still have difficulty reaching the provider, please page the [facility] (Director on Call) for the Hospitalists listed on amion for assistance.

## 2020-09-02 NOTE — Progress Notes (Deleted)
[place] for Heparin Drip Indication: chest pain/ACS  Allergies  Allergen Reactions  . No [employer] Food Allergies Anaphylaxis and Other (See Comments)    Cherries  . Penicillins Anaphylaxis    Patient Measurements: Height: 5\' 3"  (160 cm) Weight: 87.8 kg (193 lb 9.6 oz) IBW/kg (Calculated) : 52.4 Heparin Dosing Weight: 72.1 kg  Vital Signs: Temp: 99.5 F (37.5 C) (09/28 0548) Temp Source: Oral (09/28 0548) BP: 151/92 (09/28 0549) Pulse Rate: 84 (09/28 0549)  Labs: Recent Labs    08/31/20 1900 08/31/20 2056 09/01/20 0446 09/01/20 0446 09/01/20 0940 09/01/20 1217 09/01/20 1413 09/01/20 2150 09/02/20 0609  HGB 10.0*   < > 9.3*  --   --   --   --   --  10.2*  HCT 30.6*  --  28.7*  --   --   --   --   --  30.1*  PLT 249  --  236  --   --   --   --   --  212  APTT 27  --   --   --   --   --   --   --   --   LABPROT 12.5  --   --   --   --   --   --   --   --   INR 1.0  --   --   --   --   --   --   --   --   HEPARINUNFRC  --   --  <0.10*   < >  --  <0.10*  --  0.14* 0.39  CREATININE 0.73  --  0.72  --   --   --   --   --  0.61  TROPONINIHS 230*   < >  --   --  331* 306* 247*  --   --    < > = values in this interval not displayed.    Estimated Creatinine Clearance: 99.3 mL/min (by C-G formula based on SCr of 0.61 mg/dL).   Medical History: Past Medical History:  Diagnosis Date  . Chronic hypertension   . Kidney calculi   . UTI (lower urinary tract infection)    Assessment: Patient is a 39yo female presenting with chest pain with PMH significant for HTN, CAD s/p PCI and 2 stents (2018), and nephrolithiasis and UTI. Per chart review, No prior anticoagulants listed in home meds.Troponin 230>263 peaked at 331. EKG showed sinus rhythm with no significant ST or T wave changes. Chest CTA revealed no evidence of PE. Echo with LVEF 38-25%; LV diastolic consistent with Grade II diastolic dysfunction. Pharmacy consulted for Heparin  dosing and monitoring.   Hgb 10>9.3>10.2 Plt 053>976>734  9/27 1217 HL:  <0.1 9/27 2150 HL: 0.14 9/28 0609 HL: 0.39   Goal of Therapy:  Heparin level 0.3-0.7 units/ml Monitor platelets by anticoagulation protocol: Yes   Plan:  --HL is therapeutic, will continue current rate at 1600 units/hr --Check anti-Xa level in 6 hours --Continue to monitor H&H and platelets daily  Per cardio, it patient's Hgb remains stable with no further evidence of active bleeding, patient might receive LHC 9/28.  Sherilyn Banker, PharmD Pharmacy Resident  09/02/2020 7:02 AM

## 2020-09-02 NOTE — H&P (View-Only) (Signed)
Progress Note  Patient Name: Brenda Todd Date of Encounter: 09/02/2020  [facility] Cardiologist: Thurston Hole  Subjective   Patient continues to have chest pressure as well as some jaw soreness (attributes jaw discomfort to bruxism).  No further BM's.  No bleeding/melena.  Inpatient Medications    Scheduled Meds: . aspirin  81 mg Oral Daily  . [START ON 09/03/2020] aspirin  81 mg Oral Pre-Cath  . atorvastatin  80 mg Oral Daily  . influenza vac split quadrivalent PF  0.5 mL Intramuscular Tomorrow-1000  . metoprolol tartrate  25 mg Oral BID  . pantoprazole (PROTONIX) IV  40 mg Intravenous Q12H  . potassium chloride  40 mEq Oral BID  . sodium chloride flush  3 mL Intravenous Q12H   Continuous Infusions: . sodium chloride 100 mL/hr at 09/02/20 0711  . sodium chloride    . sodium chloride    . heparin 1,600 Units/hr (09/02/20 0711)  . nitroGLYCERIN Stopped (09/01/20 0530)   PRN Meds: sodium chloride, acetaminophen, ALPRAZolam, morphine injection, nitroGLYCERIN, ondansetron (ZOFRAN) IV, sodium chloride flush, zolpidem   Vital Signs    Vitals:   09/01/20 1729 09/01/20 2157 09/02/20 0548 09/02/20 0549  BP: (!) 150/81 (!) 176/98 (!) 155/108 (!) 151/92  Pulse: 81 84 92 84  Resp: 18 17 20    Temp: 98.6 F (37 C) 98.6 F (37 C) 99.5 F (37.5 C)   TempSrc: Oral Oral Oral   SpO2: 97% 100% 99%   Weight:      Height:        Intake/Output Summary (Last 24 hours) at 09/02/2020 0800 Last data filed at 09/02/2020 0711 Gross per 24 hour  Intake 3419.25 ml  Output --  Net 3419.25 ml   Last 3 Weights 09/01/2020 08/31/2020 01/24/2020  Weight (lbs) 193 lb 9.6 oz 193 lb 193 lb  Weight (kg) 87.816 kg 87.544 kg 87.544 kg      Telemetry    Normal sinus rhythm.  - Personally Reviewed  ECG    No new tracing  Physical Exam   GEN: No acute distress.   Neck: No JVD Cardiac: RRR, no murmurs, rubs, or gallops.  Respiratory: Clear to auscultation bilaterally. GI: Soft,  nontender, non-distended  MS: No edema; No deformity. Neuro:  Nonfocal  Psych: Normal affect   Labs    High Sensitivity Troponin:   Recent Labs  Lab 08/31/20 1900 08/31/20 2056 09/01/20 0940 09/01/20 1217 09/01/20 1413  TROPONINIHS 230* 263* 331* 306* 247*      Chemistry Recent Labs  Lab 08/31/20 1900 09/01/20 0446 09/02/20 0609  NA 138 140 137  K 3.2* 3.4* 3.1*  CL 105 106 104  CO2 24 27 24   GLUCOSE 101* 106* 98  BUN 17 14 8   CREATININE 0.73 0.72 0.61  CALCIUM 8.6* 8.2* 8.1*  GFRNONAA >60 >60 >60  GFRAA >60 >60 >60  ANIONGAP 9 7 9      Hematology Recent Labs  Lab 08/31/20 1900 08/31/20 1900 09/01/20 0446 09/01/20 1217 09/02/20 0609  WBC 12.1*  --  11.3*  --  10.9*  RBC 3.72*   < > 3.47* 3.71* 3.74*  HGB 10.0*  --  9.3*  --  10.2*  HCT 30.6*  --  28.7*  --  30.1*  MCV 82.3  --  82.7  --  80.5  MCH 26.9  --  26.8  --  27.3  MCHC 32.7  --  32.4  --  33.9  RDW 15.4  --  15.5  --  15.7*  PLT 249  --  236  --  212   < > = values in this interval not displayed.    BNPNo results for input(s): BNP, PROBNP in the last 168 hours.   DDimer No results for input(s): DDIMER in the last 168 hours.   Radiology    DG Chest 2 View  Result Date: 08/31/2020 CLINICAL DATA:  Chest pain. EXAM: CHEST - 2 VIEW COMPARISON:  01/24/2020 FINDINGS: The heart size and mediastinal contours are within normal limits. Both lungs are clear. The visualized skeletal structures are unremarkable. IMPRESSION: No active cardiopulmonary disease. Electronically Signed   By: Constance Holster M.D.   On: 08/31/2020 19:16   CT Head Wo Contrast  Result Date: 08/31/2020 CLINICAL DATA:  Cerebral hemorrhage suspected Emesis.  Episode of slurred speech. EXAM: CT HEAD WITHOUT CONTRAST TECHNIQUE: Contiguous axial images were obtained from the base of the skull through the vertex without intravenous contrast. COMPARISON:  Head CT 05/18/2016 FINDINGS: Brain: No intracranial hemorrhage, mass effect, or  midline shift. No hydrocephalus. The basilar cisterns are patent. No evidence of territorial infarct or acute ischemia. No extra-axial or intracranial fluid collection. Vascular: No hyperdense vessel or unexpected calcification. Skull: Normal. Negative for fracture or focal lesion. Sinuses/Orbits: Paranasal sinuses and mastoid air cells are clear. The visualized orbits are unremarkable. Other: None. IMPRESSION: Negative noncontrast head CT. Electronically Signed   By: Keith Rake M.D.   On: 08/31/2020 20:37   ECHOCARDIOGRAM COMPLETE  Result Date: 09/01/2020    ECHOCARDIOGRAM REPORT   Patient Name:   [name] Date of Exam: 09/01/2020 Medical Rec #:  [id number]       Height:       63.0 in Accession #:    [id number]      Weight:       193.0 lb Date of Birth:  1981-06-26        BSA:          1.905 m Patient Age:    46 years        BP:           127/68 mmHg Patient Gender: F               HR:           92 bpm. Exam Location:  [facility] Procedure: 2D Echo Indications:     NSTEMI I21.4  History:         Patient has no prior history of Echocardiogram examinations.                  Previous Myocardial Infarction; Risk Factors:Hypertension.  Sonographer:     L Thornton-Maynard Referring Phys:  [id number] [place] Diagnosing Phys: Kathlyn Sacramento MD IMPRESSIONS  1. Left ventricular ejection fraction, by estimation, is 55 to 60%. The left ventricle has normal function. The left ventricle has no regional wall motion abnormalities. There is moderate left ventricular hypertrophy. Left ventricular diastolic parameters are consistent with Grade II diastolic dysfunction (pseudonormalization).  2. Right ventricular systolic function is normal. The right ventricular size is normal. There is mildly elevated pulmonary artery systolic pressure.  3. Left atrial size was moderately dilated.  4. The mitral valve is normal in structure. Mild mitral valve regurgitation. No evidence of mitral stenosis.  5. The aortic valve is normal in  structure. Aortic valve regurgitation is not visualized. No aortic stenosis is present.  6. The inferior vena cava is normal in size with <50% respiratory variability, suggesting  right atrial pressure of 8 mmHg. FINDINGS  Left Ventricle: Left ventricular ejection fraction, by estimation, is 55 to 60%. The left ventricle has normal function. The left ventricle has no regional wall motion abnormalities. The left ventricular internal cavity size was normal in size. There is  moderate left ventricular hypertrophy. Left ventricular diastolic parameters are consistent with Grade II diastolic dysfunction (pseudonormalization). Right Ventricle: The right ventricular size is normal. No increase in right ventricular wall thickness. Right ventricular systolic function is normal. There is mildly elevated pulmonary artery systolic pressure. The tricuspid regurgitant velocity is 2.99  m/s, and with an assumed right atrial pressure of 8 mmHg, the estimated right ventricular systolic pressure is 23.5 mmHg. Left Atrium: Left atrial size was moderately dilated. Right Atrium: Right atrial size was normal in size. Pericardium: There is no evidence of pericardial effusion. Mitral Valve: The mitral valve is normal in structure. Mild mitral valve regurgitation. No evidence of mitral valve stenosis. Tricuspid Valve: The tricuspid valve is normal in structure. Tricuspid valve regurgitation is mild . No evidence of tricuspid stenosis. Aortic Valve: The aortic valve is normal in structure. Aortic valve regurgitation is not visualized. No aortic stenosis is present. Aortic valve mean gradient measures 5.0 mmHg. Aortic valve peak gradient measures 7.0 mmHg. Aortic valve area, by VTI measures 2.10 cm. Pulmonic Valve: The pulmonic valve was normal in structure. Pulmonic valve regurgitation is mild. No evidence of pulmonic stenosis. Aorta: The aortic root is normal in size and structure. Venous: The inferior vena cava is normal in size with less  than 50% respiratory variability, suggesting right atrial pressure of 8 mmHg. IAS/Shunts: No atrial level shunt detected by color flow Doppler.  LEFT VENTRICLE PLAX 2D LVIDd:         4.82 cm  Diastology LVIDs:         3.45 cm  LV e' medial:    7.94 cm/s LV PW:         1.35 cm  LV E/e' medial:  14.4 LV IVS:        1.52 cm  LV e' lateral:   7.18 cm/s LVOT diam:     1.80 cm  LV E/e' lateral: 15.9 LV SV:         53 LV SV Index:   28 LVOT Area:     2.54 cm  RIGHT VENTRICLE RV S prime:     10.60 cm/s TAPSE (M-mode): 2.8 cm LEFT ATRIUM             Index LA diam:        3.20 cm 1.68 cm/m LA Vol (A2C):   86.0 ml 45.15 ml/m LA Vol (A4C):   93.9 ml 49.30 ml/m LA Biplane Vol: 89.3 ml 46.89 ml/m  AORTIC VALVE                    PULMONIC VALVE AV Area (Vmax):    2.14 cm     PV Vmax:       0.99 m/s AV Area (Vmean):   1.85 cm     PV Peak grad:  3.9 mmHg AV Area (VTI):     2.10 cm AV Vmax:           132.00 cm/s AV Vmean:          104.000 cm/s AV VTI:            0.251 m AV Peak Grad:      7.0 mmHg AV Mean Grad:  5.0 mmHg LVOT Vmax:         111.00 cm/s LVOT Vmean:        75.600 cm/s LVOT VTI:          0.207 m LVOT/AV VTI ratio: 0.82  AORTA Ao Root diam: 3.00 cm MITRAL VALVE                TRICUSPID VALVE MV Area (PHT): 2.86 cm     TR Peak grad:   35.8 mmHg MV E velocity: 114.00 cm/s  TR Vmax:        299.00 cm/s MV A velocity: 49.00 cm/s MV E/A ratio:  2.33         SHUNTS                             Systemic VTI:  0.21 m                             Systemic Diam: 1.80 cm Kathlyn Sacramento MD Electronically signed by Kathlyn Sacramento MD Signature Date/Time: 09/01/2020/12:02:56 PM    Final    CT Angio Chest/Abd/Pel for Dissection W and/or Wo Contrast  Result Date: 08/31/2020 CLINICAL DATA:  Jaw pain.  Emesis. EXAM: CT ANGIOGRAPHY CHEST, ABDOMEN AND PELVIS TECHNIQUE: Non-contrast CT of the chest was initially obtained. Multidetector CT imaging through the chest, abdomen and pelvis was performed using the standard protocol  during bolus administration of intravenous contrast. Multiplanar reconstructed images and MIPs were obtained and reviewed to evaluate the vascular anatomy. CONTRAST:  19mL OMNIPAQUE IOHEXOL 350 MG/ML SOLN COMPARISON:  CT dated 01/25/2020 FINDINGS: CTA CHEST FINDINGS Cardiovascular: There is no evidence for thoracic aortic aneurysm or dissection. There is no evidence for an acute pulmonary embolism. The heart size is unremarkable. There is no significant pericardial effusion. Mediastinum/Nodes: -- No mediastinal lymphadenopathy. -- No hilar lymphadenopathy. -- No axillary lymphadenopathy. -- No supraclavicular lymphadenopathy. -- Normal thyroid gland where visualized. -  Unremarkable esophagus. Lungs/Pleura: Airways are patent. No pleural effusion, lobar consolidation, pneumothorax or pulmonary infarction. Musculoskeletal: No chest wall abnormality. No bony spinal canal stenosis. Review of the MIP images confirms the above findings. CTA ABDOMEN AND PELVIS FINDINGS VASCULAR Aorta: Normal caliber aorta without aneurysm, dissection, vasculitis or significant stenosis. Celiac: Patent without evidence of aneurysm, dissection, vasculitis or significant stenosis. SMA: Patent without evidence of aneurysm, dissection, vasculitis or significant stenosis. Renals: Both renal arteries are patent without evidence of aneurysm, dissection, vasculitis, fibromuscular dysplasia or significant stenosis. IMA: Patent without evidence of aneurysm, dissection, vasculitis or significant stenosis. Inflow: Patent without evidence of aneurysm, dissection, vasculitis or significant stenosis. Veins: No obvious venous abnormality within the limitations of this arterial phase study. Review of the MIP images confirms the above findings. NON-VASCULAR Hepatobiliary: The liver is normal. Normal gallbladder.There is no biliary ductal dilation. Pancreas: Normal contours without ductal dilatation. No peripancreatic fluid collection. Spleen: Unremarkable.  Adrenals/Urinary Tract: --Adrenal glands: Unremarkable. --Right kidney/ureter: No hydronephrosis or radiopaque kidney stones. --Left kidney/ureter: No hydronephrosis or radiopaque kidney stones. --Urinary bladder: Unremarkable. Stomach/Bowel: --Stomach/Duodenum: No hiatal hernia or other gastric abnormality. Normal duodenal course and caliber. --Small bowel: Unremarkable. --Colon: Unremarkable. --Appendix: Not visualized. No right lower quadrant inflammation or free fluid. Lymphatic: --No retroperitoneal lymphadenopathy. --No mesenteric lymphadenopathy. --No pelvic or inguinal lymphadenopathy. Reproductive: There is a new cystic ovarian mass involving the right ovary measuring 5.2 cm (axial series 6, image 159). The left ovary is  unremarkable. Other: No ascites or free air. The abdominal wall is normal. Musculoskeletal. No acute displaced fractures. Review of the MIP images confirms the above findings. IMPRESSION: 1. No acute thoracic, abdominal or pelvic pathology. Specifically, there is no evidence for an acute pulmonary embolism. 2. 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 Recommend follow-up ultrasound in 6-12 months to confirm resolution. Electronically Signed   By: Constance Holster M.D.   On: 08/31/2020 20:29    Cardiac Studies   TTE (09/01/2020): 1. Left ventricular ejection fraction, by estimation, is 55 to 60%. The  left ventricle has normal function. The left ventricle has no regional  wall motion abnormalities. There is moderate left ventricular hypertrophy.  Left ventricular diastolic  parameters are consistent with Grade II diastolic dysfunction  (pseudonormalization).  2. Right ventricular systolic function is normal. The right ventricular  size is normal. There is mildly elevated pulmonary artery systolic  pressure.  3. Left atrial size was moderately dilated.  4. The mitral valve is normal in structure. Mild mitral valve  regurgitation. No evidence of  mitral stenosis.  5. The aortic valve is normal in structure. Aortic valve regurgitation is  not visualized. No aortic stenosis is present.  6. The inferior vena cava is normal in size with <50% respiratory  variability, suggesting right atrial pressure of 8 mmHg.   Patient Profile     39 y.o. female with h/o CAD s/p PCI x 2 in 2018, nephrolithiasis, hypertension, and hyperlipidemia, admitted withi NSTEMI and recent melena with iron deficiency anemia.  Assessment & Plan    NSTEMI: Chest pressure persists.  Troponin downtrending.  Given continued chest discomfort and elevated HS-TnI, we will proceed with LHC and possible PCI this AM.  I have reviewed the risks, indications, and alternatives to cardiac catheterization, possible angioplasty, and stenting with the patient. Risks include but are not limited to bleeding, infection, vascular injury, stroke, myocardial infection, arrhythmia, kidney injury, radiation-related injury in the case of prolonged fluoroscopy use, emergency cardiac surgery, and death. The patient understands the risks of serious complication is 1-2 in 4315 with diagnostic cardiac cath and 1-2% or less with angioplasty/stenting.  Given h/o clopidogrel intolerance, we would need to use ticagrelor and aspirin for antiplatelet therapy.  Continue atorvastatin.  Continue metoprolol tartrate 25 mg BID.  Iron deficiency anemia and melena: Suspicious for upper GI bleed.  No further BM/melena for 2-3 days.  Hemoglobin stable.  Patient seen by GI yesterday; appreciate recommendations.  Given lack of further melena and improved hemoglobin today, I feel it would be best to define coronary anatomy before proceeding with endoscopy.  Ongoing management per GI and IM.  Continue IV pantoprazole.  Defer endoscopy pending catheterization results.  Hyperlipidemia:  Continue atorvastatin 80 mg daily.  For questions or updates, please contact [place] Please consult [email or web address]  for contact info under [facility] Cardiology.     Signed, Nelva Bush, MD  09/02/2020, 8:00 AM

## 2020-09-02 NOTE — Progress Notes (Addendum)
Erroneous note

## 2020-09-03 DIAGNOSIS — N838 Other noninflammatory disorders of ovary, fallopian tube and broad ligament: Secondary | ICD-10-CM

## 2020-09-03 DIAGNOSIS — F1990 Other psychoactive substance use, unspecified, uncomplicated: Secondary | ICD-10-CM

## 2020-09-03 LAB — MAGNESIUM: Magnesium: 2.1 mg/dL (ref 1.7–2.4)

## 2020-09-03 LAB — HEPARIN LEVEL (UNFRACTIONATED): Heparin Unfractionated: 0.36 IU/mL (ref 0.30–0.70)

## 2020-09-03 LAB — BASIC METABOLIC PANEL
Anion gap: 7 (ref 5–15)
BUN: 16 mg/dL (ref 6–20)
CO2: 26 mmol/L (ref 22–32)
Calcium: 8.2 mg/dL — ABNORMAL LOW (ref 8.9–10.3)
Chloride: 104 mmol/L (ref 98–111)
Creatinine, Ser: 0.76 mg/dL (ref 0.44–1.00)
GFR calc Af Amer: >60 mL/min (ref >60)
GFR calc non Af Amer: >60 mL/min (ref >60)
Glucose, Bld: 102 mg/dL — ABNORMAL HIGH (ref 70–99)
Potassium: 3.4 mmol/L — ABNORMAL LOW (ref 3.5–5.1)
Sodium: 137 mmol/L (ref 135–145)

## 2020-09-03 LAB — CBC
HCT: 30 % — ABNORMAL LOW (ref 36.0–46.0)
Hemoglobin: 9.9 g/dL — ABNORMAL LOW (ref 12.0–15.0)
MCH: 27 pg (ref 26.0–34.0)
MCHC: 33 g/dL (ref 30.0–36.0)
MCV: 81.7 fL (ref 80.0–100.0)
Platelets: 240 10*3/uL (ref 150–400)
RBC: 3.67 MIL/uL — ABNORMAL LOW (ref 3.87–5.11)
RDW: 15.5 % (ref 11.5–15.5)
WBC: 11.3 10*3/uL — ABNORMAL HIGH (ref 4.0–10.5)
nRBC: 0 % (ref 0.0–0.2)

## 2020-09-03 MED ORDER — CLOPIDOGREL BISULFATE 75 MG PO TABS: 75.0000 mg | ORAL_TABLET | Freq: Every day | ORAL | 0 refills | Status: AC

## 2020-09-03 MED ORDER — ISOSORBIDE MONONITRATE ER 30 MG PO TB24: 30.0000 mg | ORAL_TABLET | Freq: Every day | ORAL | 0 refills | Status: DC

## 2020-09-03 MED ORDER — POTASSIUM CHLORIDE CRYS ER 20 MEQ PO TBCR
40.0000 meq | EXTENDED_RELEASE_TABLET | Freq: Once | ORAL | Status: AC
  Administered 2020-09-03: 40 meq via ORAL
  Filled 2020-09-03: qty 2

## 2020-09-03 MED ORDER — ATORVASTATIN CALCIUM 80 MG PO TABS: 80.0000 mg | ORAL_TABLET | Freq: Every day | ORAL | 0 refills | Status: DC

## 2020-09-03 MED ORDER — ISOSORBIDE MONONITRATE ER 30 MG PO TB24
30.0000 mg | ORAL_TABLET | Freq: Every day | ORAL | Status: DC
  Administered 2020-09-03: 30 mg via ORAL
  Filled 2020-09-03: qty 1

## 2020-09-03 NOTE — Progress Notes (Signed)
Brenda Antigua, MD [address], [place], [place], [place], [address] [address], [place], [place], [place], [address] Phone: [phone]  Fax: [phone]   Subjective: No signs of active GI bleeding since hospital admission.  Denies abdominal pain.  No nausea or vomiting.  Reports brown bowel movement today   Objective: Exam: Vital signs in last 24 hours: Vitals:   09/03/20 0406 09/03/20 0407 09/03/20 0759 09/03/20 1147  BP: (!) 170/87  (!) 169/91 (!) 174/98  Pulse: 87  89 84  Resp: 19  19 18   Temp: 99.1 F (37.3 C)  98.5 F (36.9 C) 97.8 F (36.6 C)  TempSrc: Oral  Oral Oral  SpO2: 97%  97% 96%  Weight:  86.7 kg    Height:       Weight change: -0.016 kg  Intake/Output Summary (Last 24 hours) at 09/03/2020 1201 Last data filed at 09/03/2020 1146 Gross per 24 hour  Intake 1415.45 ml  Output 2975 ml  Net -1559.55 ml    General: No acute distress, AAO x3 Abd: Soft, NT/ND, No HSM Skin: Warm, no rashes Neck: Supple, Trachea midline   Lab Results: Lab Results  Component Value Date   WBC 11.3 (H) 09/03/2020   HGB 9.9 (L) 09/03/2020   HCT 30.0 (L) 09/03/2020   MCV 81.7 09/03/2020   PLT 240 09/03/2020   Micro Results: Recent Results (from the past 240 hour(s))  Respiratory Panel by RT PCR (Flu A&B, Covid) - Nasopharyngeal Swab     Status: None   Collection Time: 08/31/20 10:38 PM   Specimen: Nasopharyngeal Swab  Result Value Ref Range Status   SARS Coronavirus 2 by RT PCR NEGATIVE NEGATIVE Final    Comment: (NOTE) SARS-CoV-2 target nucleic acids are NOT DETECTED.  The SARS-CoV-2 RNA is generally detectable in upper respiratoy specimens during the acute phase of infection. The lowest concentration of SARS-CoV-2 viral copies this assay can detect is 131 copies/mL. A negative result does not preclude SARS-Cov-2 infection and should not be used as the sole basis for treatment or other patient management decisions. A negative result may occur with    improper specimen collection/handling, submission of specimen other than nasopharyngeal swab, presence of viral mutation(s) within the areas targeted by this assay, and inadequate number of viral copies (<131 copies/mL). A negative result must be combined with clinical observations, patient history, and epidemiological information. The expected result is Negative.  Fact Sheet for Patients:  PinkCheek.be  Fact Sheet for Healthcare Providers:  GravelBags.it  This test is no t yet approved or cleared by the [place] FDA and  has been authorized for detection and/or diagnosis of SARS-CoV-2 by FDA under an Emergency Use Authorization (EUA). This EUA will remain  in effect (meaning this test can be used) for the duration of the COVID-19 declaration under Section 564(b)(1) of the Act, 21 U.S.C. section 360bbb-3(b)(1), unless the authorization is terminated or revoked sooner.     Influenza A by PCR NEGATIVE NEGATIVE Final   Influenza B by PCR NEGATIVE NEGATIVE Final    Comment: (NOTE) The Xpert Xpress SARS-CoV-2/FLU/RSV assay is intended as an aid in  the diagnosis of influenza from Nasopharyngeal swab specimens and  should not be used as a sole basis for treatment. Nasal washings and  aspirates are unacceptable for Xpert Xpress SARS-CoV-2/FLU/RSV  testing.  Fact Sheet for Patients: PinkCheek.be  Fact Sheet for Healthcare Providers: GravelBags.it  This test is not yet approved or cleared by the [place] and  has been authorized for detection and/or diagnosis of SARS-CoV-2 by  FDA under an Emergency Use Authorization (EUA). This EUA will remain  in effect (meaning this test can be used) for the duration of the  Covid-19 declaration under Section 564(b)(1) of the Act, 21  U.S.C. section 360bbb-3(b)(1), unless the authorization is  terminated or  revoked. Performed at [facility], [address] [place], [place] [address]    Studies/Results: CARDIAC CATHETERIZATION  Result Date: 09/02/2020 Conclusions: 1. Severe single-vessel coronary artery disease; culprit lesion appears to be 99% stenosis in small OM2 branch (vessel is at most 2 mm in diameter).  Mild to moderate, non-obstructive CAD involving the LCx and RCA. 2. Widely patent rPDA and rPL1 stents. 3. Severely elevated left ventricular filling pressure (LVEDP 35-40 mmHg). Recommendations: 1. Aggressive medical therapy, given small size of culprit vessel, severely elevated LVEDP, and recent melena. 2. Restart heparin 2 hours after TR band removal, to complete at least 48 hours of IV heparin from admission. 3. Continue aspirin 81 mg daily (if tolerated from a GI standpoint).  Consider adding P2Y12 inhibitor to complete 12 months of DAPT, once cleared by GI. 4. Aggressive diuresis.  Furosemide 20 mg IV x 1 given during procedure.  Additional dosing will likely be required. 5. Aggressive secondary prevention of CAD. Nelva Bush, MD [facility]   Medications:  Scheduled Meds: . aspirin  81 mg Oral Daily  . atorvastatin  80 mg Oral Daily  . furosemide  20 mg Intravenous BID  . influenza vac split quadrivalent PF  0.5 mL Intramuscular Tomorrow-1000  . isosorbide mononitrate  30 mg Oral Daily  . metoprolol tartrate  25 mg Oral BID  . pantoprazole (PROTONIX) IV  40 mg Intravenous Q12H  . sodium chloride flush  3 mL Intravenous Q12H  . sodium chloride flush  3 mL Intravenous Q12H  . spironolactone  25 mg Oral Daily   Continuous Infusions: . sodium chloride    . heparin 1,600 Units/hr (09/03/20 0446)   PRN Meds:.sodium chloride, acetaminophen, ALPRAZolam, morphine injection, nitroGLYCERIN, ondansetron (ZOFRAN) IV, sodium chloride flush, traMADol, zolpidem   Assessment: Active Problems:   NSTEMI (non-ST elevated myocardial infarction) ([place])    Plan: Patient is  on heparin drip at this time due to NSTEMI, and has been started on aggressive medical therapy with aspirin Plavix, and Lipitor  Patient should continue on PPI twice daily for the next 8 to 12 weeks as patient likely had an NSAID induced GI ulcer given her ibuprofen use daily for 1 month and melena accompanied with this which resolved after being in the hospital and NSAIDs being stopped  Patient will need further evaluation of anemia and melena that occurred prior to presentation when it is safer to proceed from a cardiac and sedation standpoint  Anticipate upper endoscopy once cleared by cardiology and okay to hold Plavix prior to the procedure  If patient is going home today, she should have close follow-up on discharge with GI clinic as well  However, if patient has any signs of active GI bleeding, risks and benefits of procedure in the setting of recent NSTEMI will need to be evaluated and timing of procedure determined based on that.     LOS: 3 days   Brenda Antigua, MD 09/03/2020, 12:01 PM

## 2020-09-03 NOTE — Plan of Care (Signed)
  Problem: Education: Goal: Knowledge of General Education information will improve Description: Including pain rating scale, medication(s)/side effects and non-pharmacologic comfort measures Outcome: Adequate for Discharge   Problem: Health Behavior/Discharge Planning: Goal: Ability to manage health-related needs will improve Outcome: Adequate for Discharge   Problem: Clinical Measurements: Goal: Ability to maintain clinical measurements within normal limits will improve Outcome: Adequate for Discharge Goal: Will remain free from infection Outcome: Adequate for Discharge Goal: Diagnostic test results will improve Outcome: Adequate for Discharge Goal: Respiratory complications will improve Outcome: Adequate for Discharge Goal: Cardiovascular complication will be avoided Outcome: Adequate for Discharge   Problem: Activity: Goal: Risk for activity intolerance will decrease Outcome: Adequate for Discharge   Problem: Nutrition: Goal: Adequate nutrition will be maintained Outcome: Adequate for Discharge   Problem: Coping: Goal: Level of anxiety will decrease Outcome: Adequate for Discharge   Problem: Elimination: Goal: Will not experience complications related to bowel motility Outcome: Adequate for Discharge Goal: Will not experience complications related to urinary retention Outcome: Adequate for Discharge   Problem: Pain Managment: Goal: General experience of comfort will improve Outcome: Adequate for Discharge   Problem: Safety: Goal: Ability to remain free from injury will improve Outcome: Adequate for Discharge   Problem: Skin Integrity: Goal: Risk for impaired skin integrity will decrease Outcome: Adequate for Discharge   Problem: Education: Goal: Understanding of cardiac disease, CV risk reduction, and recovery process will improve Outcome: Adequate for Discharge Goal: Individualized Educational Video(s) Outcome: Adequate for Discharge   Problem:  Activity: Goal: Ability to tolerate increased activity will improve Outcome: Adequate for Discharge   Problem: Cardiac: Goal: Ability to achieve and maintain adequate cardiovascular perfusion will improve Outcome: Adequate for Discharge   Problem: Health Behavior/Discharge Planning: Goal: Ability to safely manage health-related needs after discharge will improve Outcome: Adequate for Discharge   Problem: Education: Goal: Understanding of CV disease, CV risk reduction, and recovery process will improve Outcome: Adequate for Discharge Goal: Individualized Educational Video(s) Outcome: Adequate for Discharge   Problem: Activity: Goal: Ability to return to baseline activity level will improve Outcome: Adequate for Discharge   Problem: Cardiovascular: Goal: Ability to achieve and maintain adequate cardiovascular perfusion will improve Outcome: Adequate for Discharge Goal: Vascular access site(s) Level 0-1 will be maintained Outcome: Adequate for Discharge   Problem: Health Behavior/Discharge Planning: Goal: Ability to safely manage health-related needs after discharge will improve Outcome: Adequate for Discharge

## 2020-09-03 NOTE — Progress Notes (Signed)
Mobility Specialist - Progress Note   09/03/20 1218  Mobility  Activity Ambulated in room  Level of Assistance Independent  Assistive Device None (pushed IV pole)  Distance Ambulated (ft) 70 ft  Mobility Response Tolerated well  Mobility performed by Mobility specialist  $Mobility charge 1 Mobility    Pre-mobility: 80 HR, 158/105 BP, 98% SpO2 Post-mobility: 78 HR, 179/107 BP, 98% SpO2   Pt eating lunch in bed upon arrival. Pt agreed to session. Pt independent w/ all needs. Pt ambulated 41' in room independently, pushing her own IV pole. Noted elevated BP, pt states that's her baseline. Nurse was notified. Overall, pt tolerated session very well. No c/o pain or SOB. Pt pleasant and motivated t/o session. Pt states she wants to go home. Pt left sitting EOB w/ all needs placed in reach.     Tayon Parekh Mobility Specialist  09/03/20, 12:22 PM

## 2020-09-03 NOTE — Discharge Instructions (Signed)
Heart Attack A heart attack occurs when blood and oxygen supply to the heart is cut off. A heart attack causes damage to the heart that cannot be fixed. A heart attack is also called a myocardial infarction, or MI. If you think you are having a heart attack, do not wait to see if the symptoms will go away. Get medical help right away. What are the causes? This condition may be caused by:  A fatty substance (plaque) in the blood vessels (arteries). This can block the flow of blood to the heart.  A blood clot in the blood vessels that go to the heart. The blood clot blocks blood flow.  Low blood pressure.  An abnormal heartbeat.  Some diseases, such as problems in red blood cells (anemia)orproblems in breathing (respiratory failure).  Tightening (spasm) of a blood vessel that cuts off blood to the heart.  A tear in a blood vessel of the heart.  High blood pressure. What increases the risk? The following factors may make you more likely to develop this condition:  Aging. The older you are, the higher your risk.  Having a personal or family history of chest pain, heart attack, stroke, or narrowing of the arteries in the legs, arms, head, or stomach (peripheral artery disease).  Being female.  Smoking.  Not getting regular exercise.  Being overweight or obese.  Having high blood pressure.  Having high cholesterol.  Having diabetes.  Drinking too much alcohol.  Using illegal drugs, such as cocaine or methamphetamine. What are the signs or symptoms? Symptoms of this condition include:  Chest pain. It may feel like: ? Crushing or squeezing. ? Tightness, pressure, fullness, or heaviness.  Pain in the arm, neck, jaw, back, or upper body.  Shortness of breath.  Heartburn.  Upset stomach (indigestion).  Feeling like you may vomit (nauseous).  Cold sweats.  Feeling tired.  Sudden light-headedness. How is this treated? A heart attack must be treated as soon as  possible. Treatment may include:  Medicines to: ? Break up or dissolve blood clots. ? Thin blood and help prevent blood clots. ? Treat blood pressure. ? Improve blood flow to the heart. ? Reduce pain. ? Reduce cholesterol.  Procedures to widen a blocked artery and keep it open.  Open heart surgery.  Receiving oxygen.  Making your heart strong again (cardiac rehabilitation) through exercise, education, and counseling. Follow these instructions at home: Medicines  Take over-the-counter and prescription medicines only as told by your doctor. You may need to take medicine: ? To keep your blood from clotting too easily. ? To control blood pressure. ? To lower cholesterol. ? To control heart rhythms.  Do not take these medicines unless your doctor says it is okay: ? NSAIDs, such as ibuprofen. ? Supplements that have vitamin A, vitamin E, or both. ? Hormone replacement therapy that has estrogen with or without progestin. Lifestyle      Do not use any products that have nicotine or tobacco, such as cigarettes, e-cigarettes, and chewing tobacco. If you need help quitting, ask your doctor.  Avoid secondhand smoke.  Exercise regularly. Ask your doctor about a cardiac rehab program.  Eat heart-healthy foods. Your doctor will tell you what foods to eat.  Stay at a healthy weight.  Lower your stress level.  Do not use illegal drugs. Alcohol use  Do not drink alcohol if: ? Your doctor tells you not to drink. ? You are pregnant, may be pregnant, or are planning to become pregnant.    If you drink alcohol: ? Limit how much you use to:  0-1 drink a day for women.  0-2 drinks a day for men. ? Know how much alcohol is in your drink. In the [place] one drink equals one 12 oz bottle of beer (355 mL), one 5 oz glass of wine (148 mL), or one 1 oz glass of hard liquor (44 mL). General instructions  Work with your doctor to treat other problems you may have, such as diabetes or high  blood pressure.  Get screened for depression. Get treatment if needed.  Keep your vaccines up to date. Get the flu shot (influenza vaccine) every year.  Keep all follow-up visits as told by your doctor. This is important. Contact a doctor if:  You feel very sad.  You have trouble doing your daily activities. Get help right away if:  You have sudden, unexplained discomfort in your chest, arms, back, neck, jaw, or upper body.  You have shortness of breath.  You have sudden sweating or clammy skin.  You feel like you may vomit.  You vomit.  You feel tired or weak.  You get light-headed or dizzy.  You feel your heart beating fast.  You feel your heart skipping beats.  You have blood pressure that is higher than 180/120. These symptoms may be an emergency. Do not wait to see if the symptoms will go away. Get medical help right away. Call your local emergency services (911 in the [place] Do not drive yourself to the hospital. Summary  A heart attack occurs when blood and oxygen supply to the heart is cut off.  Do not take NSAIDs unless your doctor says it is okay.  Do not smoke. Avoid secondhand smoke.  Exercise regularly. Ask your doctor about a cardiac rehab program. This information is not intended to replace advice given to you by your health care provider. Make sure you discuss any questions you have with your health care provider. Document Revised: 03/05/2019 Document Reviewed: 03/05/2019 Elsevier Patient Education  2020 [employer]  

## 2020-09-03 NOTE — Progress Notes (Signed)
Progress Note  Patient Name: Brenda Todd Date of Encounter: 09/03/2020  [facility] Cardiologist: New, Dr. Fletcher Anon  Subjective   Denies chest pain, denies shortness of breath.  Ambulated without any symptoms.  Would like to go home.  Inpatient Medications    Scheduled Meds: . aspirin  81 mg Oral Daily  . atorvastatin  80 mg Oral Daily  . furosemide  20 mg Intravenous BID  . influenza vac split quadrivalent PF  0.5 mL Intramuscular Tomorrow-1000  . isosorbide mononitrate  30 mg Oral Daily  . metoprolol tartrate  25 mg Oral BID  . pantoprazole (PROTONIX) IV  40 mg Intravenous Q12H  . sodium chloride flush  3 mL Intravenous Q12H  . sodium chloride flush  3 mL Intravenous Q12H  . spironolactone  25 mg Oral Daily   Continuous Infusions: . sodium chloride    . heparin 1,600 Units/hr (09/03/20 0446)   PRN Meds: sodium chloride, acetaminophen, ALPRAZolam, morphine injection, nitroGLYCERIN, ondansetron (ZOFRAN) IV, sodium chloride flush, traMADol, zolpidem   Vital Signs    Vitals:   09/02/20 2034 09/03/20 0406 09/03/20 0407 09/03/20 0759  BP: (!) 156/83 (!) 170/87  (!) 169/91  Pulse: 94 87  89  Resp: 18 19  19   Temp: 99.4 F (37.4 C) 99.1 F (37.3 C)  98.5 F (36.9 C)  TempSrc: Oral Oral  Oral  SpO2: 98% 97%  97%  Weight:   86.7 kg   Height:        Intake/Output Summary (Last 24 hours) at 09/03/2020 1012 Last data filed at 09/03/2020 0407 Gross per 24 hour  Intake 1055.45 ml  Output 2600 ml  Net -1544.55 ml   Last 3 Weights 09/03/2020 09/02/2020 09/01/2020  Weight (lbs) 191 lb 3.2 oz 193 lb 9 oz 193 lb 9.6 oz  Weight (kg) 86.728 kg 87.8 kg 87.816 kg      Telemetry    Sinus rhythm- Personally Reviewed  ECG    No new tracing- Personally Reviewed  Physical Exam   GEN: No acute distress.   Neck: No JVD Cardiac: RRR, no murmurs, rubs, or gallops.  Respiratory: Clear to auscultation bilaterally. GI: Soft, nontender, non-distended  MS: No edema; No  deformity. Neuro:  Nonfocal  Psych: Normal affect   Labs    High Sensitivity Troponin:   Recent Labs  Lab 08/31/20 1900 08/31/20 2056 09/01/20 0940 09/01/20 1217 09/01/20 1413  TROPONINIHS 230* 263* 331* 306* 247*      Chemistry Recent Labs  Lab 09/02/20 0609 09/02/20 1607 09/03/20 0451  NA 137 138 137  K 3.1* 3.8 3.4*  CL 104 106 104  CO2 24 23 26   GLUCOSE 98 103* 102*  BUN 8 12 16   CREATININE 0.61 0.66 0.76  CALCIUM 8.1* 8.4* 8.2*  GFRNONAA >60 >60 >60  GFRAA >60 >60 >60  ANIONGAP 9 9 7      Hematology Recent Labs  Lab 09/01/20 0446 09/01/20 1217 09/02/20 0609 09/03/20 0451  WBC 11.3*  --  10.9* 11.3*  RBC 3.47* 3.71* 3.74* 3.67*  HGB 9.3*  --  10.2* 9.9*  HCT 28.7*  --  30.1* 30.0*  MCV 82.7  --  80.5 81.7  MCH 26.8  --  27.3 27.0  MCHC 32.4  --  33.9 33.0  RDW 15.5  --  15.7* 15.5  PLT 236  --  212 240    BNPNo results for input(s): BNP, PROBNP in the last 168 hours.   DDimer No results for input(s): DDIMER  in the last 168 hours.   Radiology    CARDIAC CATHETERIZATION  Result Date: 09/02/2020 Conclusions: 1. Severe single-vessel coronary artery disease; culprit lesion appears to be 99% stenosis in small OM2 branch (vessel is at most 2 mm in diameter).  Mild to moderate, non-obstructive CAD involving the LCx and RCA. 2. Widely patent rPDA and rPL1 stents. 3. Severely elevated left ventricular filling pressure (LVEDP 35-40 mmHg). Recommendations: 1. Aggressive medical therapy, given small size of culprit vessel, severely elevated LVEDP, and recent melena. 2. Restart heparin 2 hours after TR band removal, to complete at least 48 hours of IV heparin from admission. 3. Continue aspirin 81 mg daily (if tolerated from a GI standpoint).  Consider adding P2Y12 inhibitor to complete 12 months of DAPT, once cleared by GI. 4. Aggressive diuresis.  Furosemide 20 mg IV x 1 given during procedure.  Additional dosing will likely be required. 5. Aggressive secondary  prevention of CAD. Nelva Bush, MD [facility]   ECHOCARDIOGRAM COMPLETE  Result Date: 09/01/2020    ECHOCARDIOGRAM REPORT   Patient Name:   Brenda Todd Date of Exam: 09/01/2020 Medical Rec #:  [id number]       Height:       63.0 in Accession #:    [id number]      Weight:       193.0 lb Date of Birth:  09-May-1981        BSA:          1.905 m Patient Age:    39 years        BP:           127/68 mmHg Patient Gender: F               HR:           92 bpm. Exam Location:  [facility] Procedure: 2D Echo Indications:     NSTEMI I21.4  History:         Patient has no prior history of Echocardiogram examinations.                  Previous Myocardial Infarction; Risk Factors:Hypertension.  Sonographer:     L Thornton-Maynard Referring Phys:  [id number] [place] Diagnosing Phys: Kathlyn Sacramento MD IMPRESSIONS  1. Left ventricular ejection fraction, by estimation, is 55 to 60%. The left ventricle has normal function. The left ventricle has no regional wall motion abnormalities. There is moderate left ventricular hypertrophy. Left ventricular diastolic parameters are consistent with Grade II diastolic dysfunction (pseudonormalization).  2. Right ventricular systolic function is normal. The right ventricular size is normal. There is mildly elevated pulmonary artery systolic pressure.  3. Left atrial size was moderately dilated.  4. The mitral valve is normal in structure. Mild mitral valve regurgitation. No evidence of mitral stenosis.  5. The aortic valve is normal in structure. Aortic valve regurgitation is not visualized. No aortic stenosis is present.  6. The inferior vena cava is normal in size with <50% respiratory variability, suggesting right atrial pressure of 8 mmHg. FINDINGS  Left Ventricle: Left ventricular ejection fraction, by estimation, is 55 to 60%. The left ventricle has normal function. The left ventricle has no regional wall motion abnormalities. The left ventricular internal cavity size was normal in  size. There is  moderate left ventricular hypertrophy. Left ventricular diastolic parameters are consistent with Grade II diastolic dysfunction (pseudonormalization). Right Ventricle: The right ventricular size is normal. No increase in right ventricular wall thickness. Right ventricular systolic  function is normal. There is mildly elevated pulmonary artery systolic pressure. The tricuspid regurgitant velocity is 2.99  m/s, and with an assumed right atrial pressure of 8 mmHg, the estimated right ventricular systolic pressure is 62.3 mmHg. Left Atrium: Left atrial size was moderately dilated. Right Atrium: Right atrial size was normal in size. Pericardium: There is no evidence of pericardial effusion. Mitral Valve: The mitral valve is normal in structure. Mild mitral valve regurgitation. No evidence of mitral valve stenosis. Tricuspid Valve: The tricuspid valve is normal in structure. Tricuspid valve regurgitation is mild . No evidence of tricuspid stenosis. Aortic Valve: The aortic valve is normal in structure. Aortic valve regurgitation is not visualized. No aortic stenosis is present. Aortic valve mean gradient measures 5.0 mmHg. Aortic valve peak gradient measures 7.0 mmHg. Aortic valve area, by VTI measures 2.10 cm. Pulmonic Valve: The pulmonic valve was normal in structure. Pulmonic valve regurgitation is mild. No evidence of pulmonic stenosis. Aorta: The aortic root is normal in size and structure. Venous: The inferior vena cava is normal in size with less than 50% respiratory variability, suggesting right atrial pressure of 8 mmHg. IAS/Shunts: No atrial level shunt detected by color flow Doppler.  LEFT VENTRICLE PLAX 2D LVIDd:         4.82 cm  Diastology LVIDs:         3.45 cm  LV e' medial:    7.94 cm/s LV PW:         1.35 cm  LV E/e' medial:  14.4 LV IVS:        1.52 cm  LV e' lateral:   7.18 cm/s LVOT diam:     1.80 cm  LV E/e' lateral: 15.9 LV SV:         53 LV SV Index:   28 LVOT Area:     2.54 cm   RIGHT VENTRICLE RV S prime:     10.60 cm/s TAPSE (M-mode): 2.8 cm LEFT ATRIUM             Index LA diam:        3.20 cm 1.68 cm/m LA Vol (A2C):   86.0 ml 45.15 ml/m LA Vol (A4C):   93.9 ml 49.30 ml/m LA Biplane Vol: 89.3 ml 46.89 ml/m  AORTIC VALVE                    PULMONIC VALVE AV Area (Vmax):    2.14 cm     PV Vmax:       0.99 m/s AV Area (Vmean):   1.85 cm     PV Peak grad:  3.9 mmHg AV Area (VTI):     2.10 cm AV Vmax:           132.00 cm/s AV Vmean:          104.000 cm/s AV VTI:            0.251 m AV Peak Grad:      7.0 mmHg AV Mean Grad:      5.0 mmHg LVOT Vmax:         111.00 cm/s LVOT Vmean:        75.600 cm/s LVOT VTI:          0.207 m LVOT/AV VTI ratio: 0.82  AORTA Ao Root diam: 3.00 cm MITRAL VALVE                TRICUSPID VALVE MV Area (PHT): 2.86 cm     TR Peak grad:  35.8 mmHg MV E velocity: 114.00 cm/s  TR Vmax:        299.00 cm/s MV A velocity: 49.00 cm/s MV E/A ratio:  2.33         SHUNTS                             Systemic VTI:  0.21 m                             Systemic Diam: 1.80 cm Kathlyn Sacramento MD Electronically signed by Kathlyn Sacramento MD Signature Date/Time: 09/01/2020/12:02:56 PM    Final     Cardiac Studies   Echo 09/01/2020 1. Left ventricular ejection fraction, by estimation, is 55 to 60%. The  left ventricle has normal function. The left ventricle has no regional  wall motion abnormalities. There is moderate left ventricular hypertrophy.  Left ventricular diastolic  parameters are consistent with Grade II diastolic dysfunction  (pseudonormalization).  2. Right ventricular systolic function is normal. The right ventricular  size is normal. There is mildly elevated pulmonary artery systolic  pressure.  3. Left atrial size was moderately dilated.  4. The mitral valve is normal in structure. Mild mitral valve  regurgitation. No evidence of mitral stenosis.  5. The aortic valve is normal in structure. Aortic valve regurgitation is  not visualized. No aortic  stenosis is present.  6. The inferior vena cava is normal in size with <50% respiratory  variability, suggesting right atrial pressure of 8 mmHg.   LHC  09/02/2020 Conclusions: 1. Severe single-vessel coronary artery disease; culprit lesion appears to be 99% stenosis in small OM2 branch (vessel is at most 2 mm in diameter).  Mild to moderate, non-obstructive CAD involving the LCx and RCA. 2. Widely patent rPDA and rPL1 stents. 3. Severely elevated left ventricular filling pressure (LVEDP 35-40 mmHg).  Recommendations: 1. Aggressive medical therapy, given small size of culprit vessel, severely elevated LVEDP, and recent melena. 2. Restart heparin 2 hours after TR band removal, to complete at least 48 hours of IV heparin from admission. 3. Continue aspirin 81 mg daily (if tolerated from a GI standpoint).  Consider adding P2Y12 inhibitor to complete 12 months of DAPT, once cleared by GI. 4. Aggressive diuresis.  Furosemide 20 mg IV x 1 given during procedure.  Additional dosing will likely be required. 5. Aggressive secondary prevention of CAD.   Patient Profile     39 y.o. female history of CAD status post PCI x2 in 2018, hypertension, hyperlipidemia presenting with chest pain found to have NSTEMI.  Assessment & Plan    1.  NSTEMI, hx of CAD -LHC with 99% stenosis OM2 branch (small vessel), Patent rPDA and rPL stents, mild-mod dz in LCx, RCA -Aggressive medical therapy, aspirin, Plavix, Lipitor 80. -Continue heparin drip to complete 48 hours (can be stopped after noon today) -Lopressor 25 mg twice daily -EF normal -Can be discharged later on in the afternoon today. -Close follow-up as outpatient.  2. hld -lipitor  3. htn -lopressor, increased imdur to 30mg  qd  Patient can be discharged on current cardiac medications later on today.  Close follow-up as outpatient advised.  Total encounter time 35 minutes  Greater than 50% was spent in counseling and coordination of care with  the patient     Signed, Kate Sable, MD  09/03/2020, 10:12 AM

## 2020-09-03 NOTE — Progress Notes (Signed)
ANTICOAGULATION CONSULT NOTE  Pharmacy Consult for Heparin Drip Indication: chest pain/ACS s/p LHC  Allergies  Allergen Reactions  . No [employer] Food Allergies Anaphylaxis and Other (See Comments)    Cherries  . Penicillins Anaphylaxis    Patient Measurements: Height: 5\' 3"  (160 cm) Weight: 86.7 kg (191 lb 3.2 oz) IBW/kg (Calculated) : 52.4 Heparin Dosing Weight: 72.1 kg  Vital Signs: Temp: 99.1 F (37.3 C) (09/29 0406) Temp Source: Oral (09/29 0406) BP: 170/87 (09/29 0406) Pulse Rate: 87 (09/29 0406)  Labs: Recent Labs     0000 08/31/20 1900 08/31/20 2056 09/01/20 0446 09/01/20 0446 09/01/20 0940 09/01/20 1217 09/01/20 1413 09/01/20 2150 09/02/20 0609 09/02/20 1607 09/02/20 2049 09/03/20 0451  HGB  --  10.0*   < > 9.3*  --   --   --   --   --  10.2*  --   --   --   HCT  --  30.6*  --  28.7*  --   --   --   --   --  30.1*  --   --   --   PLT  --  249  --  236  --   --   --   --   --  212  --   --   --   APTT  --  27  --   --   --   --   --   --   --   --   --   --   --   LABPROT  --  12.5  --   --   --   --   --   --   --   --   --   --   --   INR  --  1.0  --   --   --   --   --   --   --   --   --   --   --   HEPARINUNFRC  --   --   --  <0.10*   < >  --  <0.10*  --    < > 0.39  --  0.35 0.36  CREATININE   < > 0.73   < > 0.72  --   --   --   --   --  0.61 0.66  --  0.76  TROPONINIHS  --  230*   < >  --   --  331* 306* 247*  --   --   --   --   --    < > = values in this interval not displayed.    Estimated Creatinine Clearance: 98.5 mL/min (by C-G formula based on SCr of 0.76 mg/dL).   Medical History: Past Medical History:  Diagnosis Date  . Chronic hypertension   . Kidney calculi   . UTI (lower urinary tract infection)    Assessment: Patient is a 39yo female presenting with chest pain with PMH significant for HTN, CAD s/p PCI and 2 stents (2018), and nephrolithiasis and UTI. Per chart review, No prior anticoagulants listed in home meds.Troponin  230>263 peaked at 331. EKG showed sinus rhythm with no significant ST or T wave changes. Chest CTA revealed no evidence of PE. Echo with LVEF 87-56%; LV diastolic consistent with Grade II diastolic dysfunction. Pharmacy consulted for restarting Heparin 2 hours after TR band removal s/p LHC 9/28.   Confirmed with nurse that previous heparin drip was stopped prior  to procedure.  TR band removed 9/28 @ 1115 Patient received 4500 units heparin @0913  during the procedure  Hgb 10>9.3>10.2 Plt 761>950>932  9/27 1217 HL:  <0.1 9/27 2150 HL: 0.14 9/28 0609 HL: 0.39  9/28 2049 HL: 0.35 9/29 0451 HL: 0.36, therapeutic  Goal of Therapy:  Heparin level 0.3-0.7 units/ml Monitor platelets by anticoagulation protocol: Yes   Plan:  --Heparin level is therapeutic. Continue heparin at at 1600 units/hr --Check anti-Xa level tomorrow AM --Continue to monitor H&H and platelets daily  Hart Robinsons A 09/03/2020 5:36 AM

## 2020-09-06 NOTE — Discharge Summary (Signed)
[name] NAME: Brenda Todd    MR#:  [id number]  DATE OF BIRTH:  Nov 22, 1981  DATE OF ADMISSION:  08/31/2020   ADMITTING PHYSICIAN: Christel Mormon, MD  DATE OF DISCHARGE: 09/03/2020  3:58 PM  PRIMARY CARE PHYSICIAN: Patient, No Pcp Per   ADMISSION DIAGNOSIS:  Ovarian mass [N83.8] NSTEMI (non-ST elevated myocardial infarction) ([place]) [I21.4] DISCHARGE DIAGNOSIS:  Active Problems:   NSTEMI (non-ST elevated myocardial infarction) ([place])   Ovarian mass  SECONDARY DIAGNOSIS:   Past Medical History:  Diagnosis Date  . Chronic hypertension   . Kidney calculi   . UTI (lower urinary tract infection)    HOSPITAL COURSE:  39 year old female with a history of hypertension, CAD s/p PCI x2, nephrolithiasis and UTI admitted for non-STEMI  1.  NSTEMI, hx of CAD - LHC with 99% stenosis OM2 branch (small vessel), Patent rPDA and rPL stents, mild-mod dz in LCx, RCA -Aggressive medical therapy, aspirin, Plavix, Lipitor 80. -Lopressor 25 mg twice daily -EF normal -Outpatient cardiology follow-up  2. Melena - 2 episodes per pt in couple days before admission.   3. Acute Anemia, suspect due to blood loss -stable H&H, GI recommends outpatient follow-up and work-up  4. Hypokalemia - replaced  5. Accelerated Hypertension - present on admission BP 194/106, possibly contributed to chest pain and troponin elevation.  6. Essential Hypertension - continue Lopressor.    Add Imdur at discharge   Dyslipidemia / Hypertriglyceridemia - TG's 250, LDL 95, HDL 35.  Continue Lipitor.    Ovarian Mass - incidental finding on CT.  Follow up ultrasound recommended in 6-12 months as outpatient.   Obesity: Body mass index is 34.29 kg/m.  Complicates overall care and prognosis.  Pt counseled of diet and exercise for weight loss to benefit overall health    DISCHARGE CONDITIONS:  Stable CONSULTS OBTAINED:  Treatment Team:  Wellington Hampshire, MD DRUG  ALLERGIES:   Allergies  Allergen Reactions  . No [employer] Food Allergies Anaphylaxis and Other (See Comments)    Cherries  . Penicillins Anaphylaxis   DISCHARGE MEDICATIONS:   Allergies as of 09/03/2020      Reactions   No [employer] Food Allergies Anaphylaxis, Other (See Comments)   Cherries   Penicillins Anaphylaxis      Medication List    STOP taking these medications   ibuprofen 200 MG tablet Commonly known as: ADVIL     TAKE these medications   aspirin 81 MG chewable tablet Chew 81 mg by mouth daily.   atorvastatin 80 MG tablet Commonly known as: LIPITOR Take 1 tablet (80 mg total) by mouth daily. What changed:   medication strength  how much to take  when to take this   clopidogrel 75 MG tablet Commonly known as: PLAVIX Take 1 tablet (75 mg total) by mouth daily.   diphenhydrAMINE 25 MG tablet Commonly known as: BENADRYL Take 25 mg by mouth every 6 (six) hours as needed.   isosorbide mononitrate 30 MG 24 hr tablet Commonly known as: IMDUR Take 1 tablet (30 mg total) by mouth daily.   metoprolol tartrate 25 MG tablet Commonly known as: LOPRESSOR Take 25 mg by mouth 2 (two) times daily.      DISCHARGE INSTRUCTIONS:   DIET:  Cardiac diet DISCHARGE CONDITION:  Stable ACTIVITY:  Activity as tolerated OXYGEN:  Home Oxygen: No.  Oxygen Delivery: room air DISCHARGE LOCATION:  home   If you experience worsening  of your admission symptoms, develop shortness of breath, life threatening emergency, suicidal or homicidal thoughts you must seek medical attention immediately by calling 911 or calling your MD immediately  if symptoms less severe.  You Must read complete instructions/literature along with all the possible adverse reactions/side effects for all the Medicines you take and that have been prescribed to you. Take any new Medicines after you have completely understood and accpet all the possible adverse reactions/side effects.   Please  note  You were cared for by a hospitalist during your hospital stay. If you have any questions about your discharge medications or the care you received while you were in the hospital after you are discharged, you can call the unit and asked to speak with the hospitalist on call if the hospitalist that took care of you is not available. Once you are discharged, your primary care physician will handle any further medical issues. Please note that NO REFILLS for any discharge medications will be authorized once you are discharged, as it is imperative that you return to your primary care physician (or establish a relationship with a primary care physician if you do not have one) for your aftercare needs so that they can reassess your need for medications and monitor your lab values.    On the day of Discharge:  VITAL SIGNS:  Blood pressure (!) 174/98, pulse 84, temperature 97.8 F (36.6 C), temperature source Oral, resp. rate 18, height 5\' 3"  (1.6 m), weight 86.7 kg, last menstrual period 08/18/2020, SpO2 96 %. PHYSICAL EXAMINATION:  GENERAL:  39 y.o.-year-old patient lying in the bed with no acute distress.  EYES: Pupils equal, round, reactive to light and accommodation. No scleral icterus. Extraocular muscles intact.  HEENT: Head atraumatic, normocephalic. Oropharynx and nasopharynx clear.  NECK:  Supple, no jugular venous distention. No thyroid enlargement, no tenderness.  LUNGS: Normal breath sounds bilaterally, no wheezing, rales,rhonchi or crepitation. No use of accessory muscles of respiration.  CARDIOVASCULAR: S1, S2 normal. No murmurs, rubs, or gallops.  ABDOMEN: Soft, non-tender, non-distended. Bowel sounds present. No organomegaly or mass.  EXTREMITIES: No pedal edema, cyanosis, or clubbing.  NEUROLOGIC: Cranial nerves II through XII are intact. Muscle strength 5/5 in all extremities. Sensation intact. Gait not checked.  PSYCHIATRIC: The patient is alert and oriented x 3.  SKIN: No  obvious rash, lesion, or ulcer.  DATA REVIEW:   CBC Recent Labs  Lab 09/03/20 0451  WBC 11.3*  HGB 9.9*  HCT 30.0*  PLT 240    Chemistries  Recent Labs  Lab 09/03/20 0451  NA 137  K 3.4*  CL 104  CO2 26  GLUCOSE 102*  BUN 16  CREATININE 0.76  CALCIUM 8.2*  MG 2.1     Outpatient follow-up  Follow-up Information    [facility]. Schedule an appointment as soon as possible for a visit in 1 week.   Specialty: Primary Care Contact information: [place] [phone]       Kate Sable, MD. Schedule an appointment as soon as possible for a visit on 09/10/2020.   Specialties: Cardiology, Radiology Why: @ 8AM Contact information: [place] [address] [phone]                Management plans discussed with the patient, family and they are in agreement.  CODE STATUS: Prior   TOTAL TIME TAKING CARE OF THIS PATIENT: 45 minutes.    Brenda Todd  Manuella Ghazi M.D on 09/06/2020 at 5:23 PM  [facility]   CC: Primary care physician; Patient, No Pcp Per   Note: This dictation was prepared with Dragon dictation along with smaller phrase technology. Any transcriptional errors that result from this process are unintentional.

## 2020-09-08 NOTE — Progress Notes (Deleted)
Office Visit    Patient Name: Brenda Todd Date of Encounter: 09/08/2020  Primary Care Provider:  Patient, No Pcp Per Primary Cardiologist:  No primary care provider on file. Electrophysiologist:  None   Chief Complaint    Brenda Todd is a 39 y.o. female with a hx of CAD (s/p stentx2 in [place], ***), tobacco use, HTN, HLD, nephrolithiasis presents today for hospital follow up.    Past Medical History    Past Medical History:  Diagnosis Date  . Chronic hypertension   . Kidney calculi   . UTI (lower urinary tract infection)    Past Surgical History:  Procedure Laterality Date  . APPENDECTOMY     l/s. ruptured  . CESAREAN SECTION N/A 12/17/2015   Procedure: CESAREAN SECTION;  Surgeon: Honor Loh Ward, MD;  Location: [facility] ORS;  Service: Obstetrics;  Laterality: N/A;  . LEFT HEART CATH AND CORONARY ANGIOGRAPHY N/A 09/21/2020   Procedure: LEFT HEART CATH AND CORONARY ANGIOGRAPHY;  Surgeon: Nelva Bush, MD;  Location: [facility];  Service: Cardiovascular;  Laterality: N/A;  . LITHOTRIPSY      Allergies  Allergies  Allergen Reactions  . No [employer] Food Allergies Anaphylaxis and Other (See Comments)    Cherries  . Penicillins Anaphylaxis    History of Present Illness    Brenda Todd is a 39 y.o. female with a hx of *** last seen while hospitalized.  CAD with PCI and stent x2 in [place] per her report. Anginal symptoms at that time of hcest pressure and syncope. Post-intervention took aspirin/brilinta for 6 months however discontinued Brilinta due to cost.   Hospitalized 01/2020 for chest pain. Lexiscan myoview negative for ischemia and overall low risk study. Noted to be markedly hypertensive with BP 200/128. Pain was attributed to cervical radiculopathy. She was prescribed Plavix, however did not take due to cost and report of side effects.   Presented to [facility] ED 08/31/20 with chest pain, dizziness, shortness of breath, diaphoresis,  headache. Hypertensive on presentation with hypokalemia. HS-troponin 230 which peaked at 331. Anemic with Hb of 10 which then dropped to 9.3 - as she reported dark stools a few days prior she was recommended for outpatient evaluation. Underwent echo 09/01/20 EF 55-60%, no RWMA, moderate LVH, gr2DD, LA mildly dilated, mild MR.  Underwent catheterization 09-21-2020 showing severe single-vessel CAD with 99% of small OM2 branch treated medically, mild to moderate nonobstructive disease in LCx and RCA, widely patent stent to RPDA and RPL. Severely elevated LVEDP 35-34mmHg.   EKGs/Labs/Other Studies Reviewed:   The following studies were reviewed today: [facility] 2020/09/21 Conclusions: 1. Severe single-vessel coronary artery disease; culprit lesion appears to be 99% stenosis in small OM2 branch (vessel is at most 2 mm in diameter).  Mild to moderate, non-obstructive CAD involving the LCx and RCA. 2. Widely patent rPDA and rPL1 stents. 3. Severely elevated left ventricular filling pressure (LVEDP 35-40 mmHg).   Recommendations: 1. Aggressive medical therapy, given small size of culprit vessel, severely elevated LVEDP, and recent melena. 2. Restart heparin 2 hours after TR band removal, to complete at least 48 hours of IV heparin from admission. 3. Continue aspirin 81 mg daily (if tolerated from a GI standpoint).  Consider adding P2Y12 inhibitor to complete 12 months of DAPT, once cleared by GI. 4. Aggressive diuresis.  Furosemide 20 mg IV x 1 given during procedure.  Additional dosing will likely be required. 5. Aggressive secondary prevention of CAD.  Echo 09/01/20  1.  Left ventricular ejection fraction, by estimation, is 55 to 60%. The  left ventricle has normal function. The left ventricle has no regional  wall motion abnormalities. There is moderate left ventricular hypertrophy.  Left ventricular diastolic  parameters are consistent with Grade II diastolic dysfunction  (pseudonormalization).   2. Right  ventricular systolic function is normal. The right ventricular  size is normal. There is mildly elevated pulmonary artery systolic  pressure.   3. Left atrial size was moderately dilated.   4. The mitral valve is normal in structure. Mild mitral valve  regurgitation. No evidence of mitral stenosis.   5. The aortic valve is normal in structure. Aortic valve regurgitation is  not visualized. No aortic stenosis is present.   6. The inferior vena cava is normal in size with <50% respiratory  variability, suggesting right atrial pressure of 8 mmHg.   EKG:  EKG is ordered today.  The ekg ordered today demonstrates ***  Recent Labs: 09/03/2020: BUN 16; Creatinine, Ser 0.76; Hemoglobin 9.9; Magnesium 2.1; Platelets 240; Potassium 3.4; Sodium 137  Recent Lipid Panel    Component Value Date/Time   CHOL 180 09/01/2020 0446   TRIG 250 (H) 09/01/2020 0446   HDL 35 (L) 09/01/2020 0446   CHOLHDL 5.1 09/01/2020 0446   VLDL 50 (H) 09/01/2020 0446   Prentiss 95 09/01/2020 0446    Home Medications   No outpatient medications have been marked as taking for the 09/10/20 encounter (Appointment) with Loel Dubonnet, NP.      Review of Systems    ***   ROS All other systems reviewed and are otherwise negative except as noted above.  Physical Exam    VS:  LMP 08/18/2020  , BMI There is no height or weight on file to calculate BMI. GEN: Well nourished, well developed, in no acute distress. HEENT: normal. Neck: Supple, no JVD, carotid bruits, or masses. Cardiac: ***RRR, no murmurs, rubs, or gallops. No clubbing, cyanosis, edema.  ***Radials/DP/PT 2+ and equal bilaterally.  Respiratory:  ***Respirations regular and unlabored, clear to auscultation bilaterally. GI: Soft, nontender, nondistended, BS + x 4. MS: No deformity or atrophy. Skin: Warm and dry, no rash. Neuro:  Strength and sensation are intact. Psych: Normal affect.  Assessment & Plan    1. CAD - s/p stent x2 (rPDA, rPL) in 2018 in  [place] and s/p recent NSTEMI.   2. Diastolic dysfunction -   3. Anemia -   4. Hypokalemia -   5. HTN -   6. HLD, LDL goal < 70 -   7. Ovarian mass - Incidental finding on CT during admission. Recommended for ultrasound in 6-12 months for monitoring.   Disposition: Follow up {follow up:15908} with ***   Loel Dubonnet, NP 09/08/2020, 8:27 AM

## 2020-09-09 ENCOUNTER — Telehealth: Payer: Self-pay | Admitting: Gastroenterology

## 2020-09-09 NOTE — Telephone Encounter (Signed)
Tried calling patient 2x to schedule hospital follow up per Dr. Bonna Gains. Unable to contact patient, no VM set up. A letter was sent to patient

## 2020-09-10 ENCOUNTER — Ambulatory Visit: Payer: Medicaid - Out of State | Admitting: Family

## 2021-04-28 ENCOUNTER — Other Ambulatory Visit: Payer: Self-pay

## 2021-04-28 ENCOUNTER — Emergency Department: Payer: Medicaid - Out of State

## 2021-04-28 ENCOUNTER — Observation Stay
Admission: EM | Admit: 2021-04-28 | Discharge: 2021-04-29 | Disposition: A | Discharge status: Home / Self Care | Payer: Medicaid - Out of State | Attending: Emergency Medicine | Admitting: Emergency Medicine

## 2021-04-28 DIAGNOSIS — R079 Chest pain, unspecified: Secondary | ICD-10-CM

## 2021-04-28 DIAGNOSIS — Z7982 Long term (current) use of aspirin: Secondary | ICD-10-CM | POA: Insufficient documentation

## 2021-04-28 DIAGNOSIS — I161 Hypertensive emergency: Secondary | ICD-10-CM

## 2021-04-28 DIAGNOSIS — D72829 Elevated white blood cell count, unspecified: Secondary | ICD-10-CM | POA: Diagnosis present

## 2021-04-28 DIAGNOSIS — I2510 Atherosclerotic heart disease of native coronary artery without angina pectoris: Secondary | ICD-10-CM | POA: Insufficient documentation

## 2021-04-28 DIAGNOSIS — Z79899 Other long term (current) drug therapy: Secondary | ICD-10-CM | POA: Insufficient documentation

## 2021-04-28 DIAGNOSIS — R1013 Epigastric pain: Secondary | ICD-10-CM | POA: Diagnosis present

## 2021-04-28 DIAGNOSIS — I10 Essential (primary) hypertension: Secondary | ICD-10-CM | POA: Insufficient documentation

## 2021-04-28 DIAGNOSIS — Z20822 Contact with and (suspected) exposure to covid-19: Secondary | ICD-10-CM | POA: Insufficient documentation

## 2021-04-28 DIAGNOSIS — F17210 Nicotine dependence, cigarettes, uncomplicated: Secondary | ICD-10-CM | POA: Insufficient documentation

## 2021-04-28 DIAGNOSIS — R0789 Other chest pain: Principal | ICD-10-CM | POA: Insufficient documentation

## 2021-04-28 DIAGNOSIS — R300 Dysuria: Secondary | ICD-10-CM | POA: Clinically undetermined

## 2021-04-28 DIAGNOSIS — I160 Hypertensive urgency: Secondary | ICD-10-CM

## 2021-04-28 HISTORY — DX: Acute myocardial infarction, unspecified: I21.9

## 2021-04-28 LAB — URINALYSIS, COMPLETE (UACMP) WITH MICROSCOPIC
Bacteria, UA: NONE SEEN
Bilirubin Urine: NEGATIVE
Glucose, UA: NEGATIVE mg/dL
Ketones, ur: NEGATIVE mg/dL
Nitrite: NEGATIVE
Protein, ur: 30 mg/dL — AB
RBC / HPF: >50 RBC/hpf — ABNORMAL HIGH (ref 0–5)
Specific Gravity, Urine: >1.046 — ABNORMAL HIGH (ref 1.005–1.030)
WBC, UA: >50 WBC/hpf — ABNORMAL HIGH (ref 0–5)
pH: 5 (ref 5.0–8.0)

## 2021-04-28 LAB — URINE DRUG SCREEN, QUALITATIVE (ARMC ONLY)
Amphetamines, Ur Screen: NOT DETECTED
Barbiturates, Ur Screen: NOT DETECTED
Benzodiazepine, Ur Scrn: NOT DETECTED
Cannabinoid 50 Ng, Ur ~~LOC~~: POSITIVE — AB
Cocaine Metabolite,Ur ~~LOC~~: NOT DETECTED
MDMA (Ecstasy)Ur Screen: NOT DETECTED
Methadone Scn, Ur: NOT DETECTED
Opiate, Ur Screen: POSITIVE — AB
Phencyclidine (PCP) Ur S: NOT DETECTED
Tricyclic, Ur Screen: NOT DETECTED

## 2021-04-28 LAB — HEPATIC FUNCTION PANEL
ALT: 16 U/L (ref 0–44)
AST: 18 U/L (ref 15–41)
Albumin: 4 g/dL (ref 3.5–5.0)
Alkaline Phosphatase: 56 U/L (ref 38–126)
Bilirubin, Direct: <0.1 mg/dL (ref 0.0–0.2)
Total Bilirubin: 0.8 mg/dL (ref 0.3–1.2)
Total Protein: 7.6 g/dL (ref 6.5–8.1)

## 2021-04-28 LAB — BASIC METABOLIC PANEL
Anion gap: 12 (ref 5–15)
BUN: 16 mg/dL (ref 6–20)
CO2: 20 mmol/L — ABNORMAL LOW (ref 22–32)
Calcium: 9 mg/dL (ref 8.9–10.3)
Chloride: 104 mmol/L (ref 98–111)
Creatinine, Ser: 0.78 mg/dL (ref 0.44–1.00)
GFR, Estimated: >60 mL/min (ref >60)
Glucose, Bld: 119 mg/dL — ABNORMAL HIGH (ref 70–99)
Potassium: 3.4 mmol/L — ABNORMAL LOW (ref 3.5–5.1)
Sodium: 136 mmol/L (ref 135–145)

## 2021-04-28 LAB — CBC
HCT: 45.2 % (ref 36.0–46.0)
Hemoglobin: 15.1 g/dL — ABNORMAL HIGH (ref 12.0–15.0)
MCH: 25.8 pg — ABNORMAL LOW (ref 26.0–34.0)
MCHC: 33.4 g/dL (ref 30.0–36.0)
MCV: 77.1 fL — ABNORMAL LOW (ref 80.0–100.0)
Platelets: 269 10*3/uL (ref 150–400)
RBC: 5.86 MIL/uL — ABNORMAL HIGH (ref 3.87–5.11)
RDW: 14.6 % (ref 11.5–15.5)
WBC: 15.8 10*3/uL — ABNORMAL HIGH (ref 4.0–10.5)
nRBC: 0 % (ref 0.0–0.2)

## 2021-04-28 LAB — PROTIME-INR
INR: 1 (ref 0.8–1.2)
Prothrombin Time: 13.2 seconds (ref 11.4–15.2)

## 2021-04-28 LAB — RESP PANEL BY RT-PCR (FLU A&B, COVID) ARPGX2
Influenza A by PCR: NEGATIVE
Influenza B by PCR: NEGATIVE
SARS Coronavirus 2 by RT PCR: NEGATIVE

## 2021-04-28 LAB — TROPONIN I (HIGH SENSITIVITY)
Troponin I (High Sensitivity): 8 ng/L (ref <18)
Troponin I (High Sensitivity): 10 ng/L (ref <18)

## 2021-04-28 LAB — LIPASE, BLOOD: Lipase: 23 U/L (ref 11–51)

## 2021-04-28 MED ORDER — PANTOPRAZOLE SODIUM 40 MG IV SOLR
40.0000 mg | Freq: Two times a day (BID) | INTRAVENOUS | Status: DC
  Administered 2021-04-28 – 2021-04-29 (×2): 40 mg via INTRAVENOUS
  Filled 2021-04-28 (×2): qty 40

## 2021-04-28 MED ORDER — ONDANSETRON HCL 4 MG/2ML IJ SOLN
4.0000 mg | INTRAMUSCULAR | Status: AC
  Administered 2021-04-28: 4 mg via INTRAVENOUS
  Filled 2021-04-28: qty 2

## 2021-04-28 MED ORDER — IOHEXOL 350 MG/ML SOLN
100.0000 mL | Freq: Once | INTRAVENOUS | Status: AC | PRN
  Administered 2021-04-28: 100 mL via INTRAVENOUS

## 2021-04-28 MED ORDER — METOPROLOL TARTRATE 5 MG/5ML IV SOLN
2.5000 mg | Freq: Once | INTRAVENOUS | Status: AC
  Administered 2021-04-28: 2.5 mg via INTRAVENOUS
  Filled 2021-04-28: qty 5

## 2021-04-28 MED ORDER — ASPIRIN EC 81 MG PO TBEC
81.0000 mg | DELAYED_RELEASE_TABLET | Freq: Every day | ORAL | Status: DC
  Administered 2021-04-29: 81 mg via ORAL
  Filled 2021-04-28: qty 1

## 2021-04-28 MED ORDER — NITROGLYCERIN 0.4 MG SL SUBL: 0.4000 mg | SUBLINGUAL_TABLET | SUBLINGUAL | Status: DC | PRN

## 2021-04-28 MED ORDER — MORPHINE SULFATE (PF) 4 MG/ML IV SOLN
4.0000 mg | Freq: Once | INTRAVENOUS | Status: AC
  Administered 2021-04-28: 4 mg via INTRAVENOUS
  Filled 2021-04-28: qty 1

## 2021-04-28 MED ORDER — FENTANYL CITRATE (PF) 100 MCG/2ML IJ SOLN
50.0000 ug | Freq: Once | INTRAMUSCULAR | Status: AC
  Administered 2021-04-28: 50 ug via INTRAVENOUS
  Filled 2021-04-28: qty 2

## 2021-04-28 MED ORDER — METOPROLOL TARTRATE 25 MG PO TABS
12.5000 mg | ORAL_TABLET | Freq: Two times a day (BID) | ORAL | Status: DC
  Administered 2021-04-28 – 2021-04-29 (×2): 12.5 mg via ORAL
  Filled 2021-04-28 (×2): qty 1

## 2021-04-28 MED ORDER — SODIUM CHLORIDE 0.9 % IV SOLN
1.0000 g | INTRAVENOUS | Status: DC
  Administered 2021-04-28: 1 g via INTRAVENOUS
  Filled 2021-04-28 (×2): qty 10

## 2021-04-28 MED ORDER — FENTANYL CITRATE (PF) 100 MCG/2ML IJ SOLN
50.0000 ug | Freq: Once | INTRAMUSCULAR | Status: AC
Start: 2021-04-28 — End: 2021-04-28
  Administered 2021-04-28: 50 ug via INTRAVENOUS
  Filled 2021-04-28: qty 2

## 2021-04-28 MED ORDER — HYDROCODONE-ACETAMINOPHEN 5-325 MG PO TABS
1.0000 | ORAL_TABLET | ORAL | Status: DC | PRN
  Administered 2021-04-28 – 2021-04-29 (×3): 2 via ORAL
  Filled 2021-04-28 (×3): qty 2

## 2021-04-28 MED ORDER — METOPROLOL TARTRATE 5 MG/5ML IV SOLN
5.0000 mg | Freq: Once | INTRAVENOUS | Status: AC
  Administered 2021-04-28: 5 mg via INTRAVENOUS
  Filled 2021-04-28: qty 5

## 2021-04-28 MED ORDER — ATORVASTATIN CALCIUM 20 MG PO TABS
80.0000 mg | ORAL_TABLET | Freq: Every day | ORAL | Status: DC
  Administered 2021-04-28: 80 mg via ORAL
  Filled 2021-04-28: qty 4

## 2021-04-28 NOTE — ED Provider Notes (Signed)
-----------------------------------------   6:57 PM on 04/28/2021 -----------------------------------------  I took over care of this patient from Dr. Jacqualine Code.  CT angio of the chest was negative.  Troponins were negative x2.  The patient still has some chest pain although it is significantly improved.  Based on the initial plan discussed with Dr. Jacqualine Code, I consulted the hospitalist Dr. Derryl Harbor for admission.  The patient also reported some intermittent dark tarry stools so I performed a DRE, however it revealed brown stool which was coag negative.  I consulted Dr. Nehemiah Massed from cardiology.   Arta Silence, MD 04/28/21 [phone]

## 2021-04-28 NOTE — H&P (Addendum)
History and Physical    Brenda Todd:[id number] DOB: Jan 12, 1981 DOA: 04/28/2021  Referring MD/NP/PA:   PCP: Patient, No Pcp Per (Inactive)   Patient coming from:  The patient is coming from home.  At baseline, pt is independent for most of ADL.        Chief Complaint: chest pain   HPI: Brenda Todd is a 40 y.o. female with medical history significant of CAD status post stenting X 2,   hypertension,  nephrolithiasis, UTI, medication noncompliance presented with chest pain, epigastric pain, dysuria  Per patient, chart review, patient states since Friday she did not feel well, this morning and felt like her 64-year-old was laying on her chest but found it was not.  Also have epigastric pain, nausea but significant vomiting, no diarrhea, states feels similar to her previous heart attack couple years ago. also status has some dysuria, reports possible black stool, but ED guaiac test negative.  She went to the ED Patient reports no fever, chills, shortness of breath, cough,  leg swelling. Patient states she has been taking her metoprolol, Brilinta for the last 2 months because she cannot afford, she occasionally take a baby aspirin States marijuana use, last used 2 days ago  ED patient was found temperature 99.4, heart rate 64-91, respiratory rate 17-70, now 21, 26/94-2 1 1/115, oxygen saturation 95 on room air labs shows potassium 3.4, bicarb 20, glucose 119, otherwise unremarkable CMP, high-sensitivity troponin 8-10, WBC 15.8, hemoglobin 15.1, 0.0, SARS-CoV-2 negative, flu negative CTA chest abdomen pelvis: Intact thoracoabdominal aorta.  Negative for dissection. No other acute intrathoracic, abdominal or pelvic finding by CTA. Chest x-ray: Negative  The patient was given fentanyl, metoprolol, morphine, Zofran hospitalist was requested to admit patient    Review of Systems:   General: no fevers, chills, no body weight gain, has poor appetite, has fatigue HEENT: no blurry vision,  hearing changes or sore throat Respiratory: no dyspnea, coughing, wheezing CV: no chest pain, no palpitations GI: no nausea, vomiting, abdominal pain, diarrhea, constipation GU: no dysuria, burning on urination, increased urinary frequency, hematuria  Ext: no leg edema Neuro: no unilateral weakness, numbness, or tingling, no vision change or hearing loss Skin: no rash, no skin tear. MSK: No muscle spasm, no deformity, no limitation of range of movement in spin Heme: No easy bruising.  Travel history: No recent long distant travel.  Allergy:  Allergies  Allergen Reactions  . No [employer] Food Allergies Anaphylaxis and Other (See Comments)    Cherries  . Penicillins Anaphylaxis    Past Medical History:  Diagnosis Date  . Chronic hypertension   . Kidney calculi   . MI (myocardial infarction) ([place])   . UTI (lower urinary tract infection)     Past Surgical History:  Procedure Laterality Date  . APPENDECTOMY     l/s. ruptured  . CARDIAC SURGERY    . CESAREAN SECTION N/A 12/17/2015   Procedure: CESAREAN SECTION;  Surgeon: Honor Loh Ward, MD;  Location: [facility];  Service: Obstetrics;  Laterality: N/A;  . LEFT HEART CATH AND CORONARY ANGIOGRAPHY N/A 09/02/2020   Procedure: LEFT HEART CATH AND CORONARY ANGIOGRAPHY;  Surgeon: Nelva Bush, MD;  Location: [facility];  Service: Cardiovascular;  Laterality: N/A;  . LITHOTRIPSY      Social History:  reports that she has been smoking cigarettes. She has been smoking about 0.25 packs per day. She has never used smokeless tobacco. She reports current drug use. Drug: Marijuana. She reports that she  does not drink alcohol.  Family History: No family history on file.   Prior to Admission medications   Medication Sig Start Date End Date Taking? Authorizing Provider  aspirin 81 MG chewable tablet Chew 81 mg by mouth daily.    [provider]  atorvastatin (LIPITOR) 80 MG tablet Take 1 tablet (80 mg total) by mouth  daily. 09/04/20 10/04/20  Max Sane, MD  diphenhydrAMINE (BENADRYL) 25 MG tablet Take 25 mg by mouth every 6 (six) hours as needed.    [provider]  isosorbide mononitrate (IMDUR) 30 MG 24 hr tablet Take 1 tablet (30 mg total) by mouth daily. 09/04/20 10/04/20  Max Sane, MD  metoprolol tartrate (LOPRESSOR) 25 MG tablet Take 25 mg by mouth 2 (two) times daily.    [provider]    Physical Exam: Vitals:   04/28/21 1542 04/28/21 1600 04/28/21 1630 04/28/21 1700  BP: (!) 158/96 (!) 162/98 (!) 146/93 (!) 126/94  Pulse: 66 64 64 66  Resp: 18 (!) 21 (!) 23 (!) 21  Temp:      TempSrc:      SpO2: 96% 95% 94% 95%  Weight:      Height:       General: Not in acute distress HEENT:       Eyes: PERRL, EOMI, no scleral icterus.       ENT: No discharge from the ears and nose, no pharynx injection, no tonsillar enlargement.        Neck: No JVD, no bruit, no mass felt. Heme: No neck lymph node enlargement. Cardiac: S1/S2, RRR, No murmurs, No gallops or rubs. Respiratory: Good air movement bilaterally. No rales, wheezing, rhonchi or rubs. GI: Soft, nondistended, nontender, no rebound pain, no organomegaly, BS present. GU: No hematuria Ext: No pitting leg edema bilaterally. 2+DP/PT pulse bilaterally. Musculoskeletal: No joint deformities, No joint redness or warmth, no limitation of ROM in spin. Skin: No rashes.  Neuro: Alert, oriented X3, cranial nerves II-XII grossly intact, moves all extremities normally. Muscle strength 5/5 in all extremities, sensation to light touch intact. Brachial reflex 2+ bilaterally. Knee reflex 1+ bilaterally. Negative Babinski's sign. Normal finger to nose test. Psych: Patient is not psychotic, no suicidal or hemocidal ideation.  Labs on Admission: I have personally reviewed following labs and imaging studies  CBC: Recent Labs  Lab 04/28/21 1332  WBC 15.8*  HGB 15.1*  HCT 45.2  MCV 77.1*  PLT 347   Basic Metabolic Panel: Recent Labs   Lab 04/28/21 1332  NA 136  K 3.4*  CL 104  CO2 20*  GLUCOSE 119*  BUN 16  CREATININE 0.78  CALCIUM 9.0   GFR: Estimated Creatinine Clearance: 96.8 mL/min (by C-G formula based on SCr of 0.78 mg/dL). Liver Function Tests: Recent Labs  Lab 04/28/21 1332  AST 18  ALT 16  ALKPHOS 56  BILITOT 0.8  PROT 7.6  ALBUMIN 4.0   Recent Labs  Lab 04/28/21 1332  LIPASE 23   No results for input(s): AMMONIA in the last 168 hours. Coagulation Profile: Recent Labs  Lab 04/28/21 1332  INR 1.0   Cardiac Enzymes: No results for input(s): CKTOTAL, CKMB, CKMBINDEX, TROPONINI in the last 168 hours. BNP (last 3 results) No results for input(s): PROBNP in the last 8760 hours. HbA1C: No results for input(s): HGBA1C in the last 72 hours. CBG: No results for input(s): GLUCAP in the last 168 hours. Lipid Profile: No results for input(s): CHOL, HDL, LDLCALC, TRIG, CHOLHDL, LDLDIRECT in the last  72 hours. Thyroid Function Tests: No results for input(s): TSH, T4TOTAL, FREET4, T3FREE, THYROIDAB in the last 72 hours. Anemia Panel: No results for input(s): VITAMINB12, FOLATE, FERRITIN, TIBC, IRON, RETICCTPCT in the last 72 hours. Urine analysis:    Component Value Date/Time   COLORURINE YELLOW (A) 09/01/2020 1835   APPEARANCEUR HAZY (A) 09/01/2020 1835   APPEARANCEUR Cloudy 12/11/2014 1620   LABSPEC 1.012 09/01/2020 1835   LABSPEC 1.014 12/11/2014 1620   PHURINE 6.0 09/01/2020 1835   GLUCOSEU NEGATIVE 09/01/2020 1835   GLUCOSEU Negative 12/11/2014 1620   HGBUR NEGATIVE 09/01/2020 1835   BILIRUBINUR NEGATIVE 09/01/2020 1835   BILIRUBINUR Negative 12/11/2014 1620   KETONESUR NEGATIVE 09/01/2020 1835   PROTEINUR NEGATIVE 09/01/2020 1835   UROBILINOGEN 1.0 04/05/2012 1807   NITRITE NEGATIVE 09/01/2020 1835   LEUKOCYTESUR NEGATIVE 09/01/2020 1835   LEUKOCYTESUR Negative 12/11/2014 1620   Sepsis Labs: @LABRCNTIP (procalcitonin:4,lacticidven:4) ) Recent Results (from the past 240  hour(s))  Resp Panel by RT-PCR (Flu A&B, Covid) Nasopharyngeal Swab     Status: None   Collection Time: 04/28/21  2:26 PM   Specimen: Nasopharyngeal Swab; Nasopharyngeal(NP) swabs in vial transport medium  Result Value Ref Range Status   SARS Coronavirus 2 by RT PCR NEGATIVE NEGATIVE Final    Comment: (NOTE) SARS-CoV-2 target nucleic acids are NOT DETECTED.  The SARS-CoV-2 RNA is generally detectable in upper respiratory specimens during the acute phase of infection. The lowest concentration of SARS-CoV-2 viral copies this assay can detect is 138 copies/mL. A negative result does not preclude SARS-Cov-2 infection and should not be used as the sole basis for treatment or other patient management decisions. A negative result may occur with  improper specimen collection/handling, submission of specimen other than nasopharyngeal swab, presence of viral mutation(s) within the areas targeted by this assay, and inadequate number of viral copies(<138 copies/mL). A negative result must be combined with clinical observations, patient history, and epidemiological information. The expected result is Negative.  Fact Sheet for Patients:  EntrepreneurPulse.com.au  Fact Sheet for Healthcare Providers:  IncredibleEmployment.be  This test is no t yet approved or cleared by the [place] FDA and  has been authorized for detection and/or diagnosis of SARS-CoV-2 by FDA under an Emergency Use Authorization (EUA). This EUA will remain  in effect (meaning this test can be used) for the duration of the COVID-19 declaration under Section 564(b)(1) of the Act, 21 U.S.C.section 360bbb-3(b)(1), unless the authorization is terminated  or revoked sooner.       Influenza A by PCR NEGATIVE NEGATIVE Final   Influenza B by PCR NEGATIVE NEGATIVE Final    Comment: (NOTE) The Xpert Xpress SARS-CoV-2/FLU/RSV plus assay is intended as an aid in the diagnosis of influenza from  Nasopharyngeal swab specimens and should not be used as a sole basis for treatment. Nasal washings and aspirates are unacceptable for Xpert Xpress SARS-CoV-2/FLU/RSV testing.  Fact Sheet for Patients: EntrepreneurPulse.com.au  Fact Sheet for Healthcare Providers: IncredibleEmployment.be  This test is not yet approved or cleared by the [place] FDA and has been authorized for detection and/or diagnosis of SARS-CoV-2 by FDA under an Emergency Use Authorization (EUA). This EUA will remain in effect (meaning this test can be used) for the duration of the COVID-19 declaration under Section 564(b)(1) of the Act, 21 U.S.C. section 360bbb-3(b)(1), unless the authorization is terminated or revoked.  Performed at [facility], [address], [place], [place] [address]      Radiological Exams on Admission: DG Chest 2 View  Result Date: 04/28/2021 CLINICAL DATA:  Chest pain and shortness of breath which began 04/24/2021 EXAM: CHEST - 2 VIEW COMPARISON:  Radiograph 08/31/2020, CT 08/31/2020 FINDINGS: No consolidation, features of edema, pneumothorax, or effusion. Pulmonary vascularity is normally distributed. The cardiomediastinal contours are unremarkable. No acute osseous or soft tissue abnormality. IMPRESSION: No acute cardiopulmonary abnormality. Electronically Signed   By: Lovena Le M.D.   On: 04/28/2021 14:14   CT Angio Chest/Abd/Pel for Dissection W and/or Wo Contrast  Result Date: 04/28/2021 CLINICAL DATA:  Acute chest and back pain, concern for dissection. History of coronary disease and previous stenting. EXAM: CT ANGIOGRAPHY CHEST, ABDOMEN AND PELVIS TECHNIQUE: Non-contrast CT of the chest was initially obtained. Multidetector CT imaging through the chest, abdomen and pelvis was performed using the standard protocol during bolus administration of intravenous contrast. Multiplanar reconstructed images and MIPs were obtained and  reviewed to evaluate the vascular anatomy. CONTRAST:  144mL OMNIPAQUE IOHEXOL 350 MG/ML SOLN COMPARISON:  08/31/2020 FINDINGS: CTA CHEST FINDINGS Cardiovascular: Initial noncontrast imaging demonstrates no definitive hyperdense intramural hematoma. No mediastinal hemorrhage or hematoma. Normal heart size. No pericardial effusion. Post-contrast, the thoracic aorta is intact. Negative for dissection. No aneurysm. Patent 3 vessel arch anatomy. No acute aortic finding. Visualized pulmonary arteries appear patent. No signal filling defect or pulmonary embolus. Mediastinum/Nodes: No enlarged mediastinal, hilar, or axillary lymph nodes. Thyroid gland, trachea, and esophagus demonstrate no significant findings. Lungs/Pleura: Lungs are clear. No pleural effusion or pneumothorax. Musculoskeletal: No chest wall abnormality. No acute or significant osseous findings. Review of the MIP images confirms the above findings. CTA ABDOMEN AND PELVIS FINDINGS VASCULAR Aorta: Normal caliber aorta without aneurysm, dissection, vasculitis or significant stenosis. Celiac: Patent without evidence of aneurysm, dissection, vasculitis or significant stenosis. SMA: Patent without evidence of aneurysm, dissection, vasculitis or significant stenosis. Renals: Both renal arteries are patent without evidence of aneurysm, dissection, vasculitis, fibromuscular dysplasia or significant stenosis. IMA: Patent without evidence of aneurysm, dissection, vasculitis or significant stenosis. Inflow: Patent without evidence of aneurysm, dissection, vasculitis or significant stenosis. Veins: Dedicated venous imaging not performed Review of the MIP images confirms the above findings. NON-VASCULAR Hepatobiliary: No focal liver abnormality is seen. No gallstones, gallbladder wall thickening, or biliary dilatation. Pancreas: Unremarkable. No pancreatic ductal dilatation or surrounding inflammatory changes. Spleen: Normal in size without focal abnormality.  Adrenals/Urinary Tract: Adrenal glands are unremarkable. Kidneys are normal, without renal calculi, focal lesion, or hydronephrosis. Bladder is unremarkable. Stomach/Bowel: Stomach is within normal limits. Previous appendectomy noted. No evidence of bowel wall thickening, distention, or inflammatory changes. Lymphatic: No bulky adenopathy. Reproductive: Uterus and bilateral adnexa are unremarkable. Other: No abdominal wall hernia or abnormality. No abdominopelvic ascites. Musculoskeletal: No acute or significant osseous findings. Review of the MIP images confirms the above findings. IMPRESSION: Intact thoracoabdominal aorta.  Negative for dissection. No other acute intrathoracic, abdominal or pelvic finding by CTA. Electronically Signed   By: Jerilynn Mages.  Shick M.D.   On: 04/28/2021 15:53     EKG: Independently reviewed.  Not done in ED, will get one.   Assessment/Plan Active Problems:   Hypertensive urgency   Chest pain   Epigastric pain   Dysuria   Leukocytosis   #Chest pain: etiology unclear, most likely unstable angina, given flat troponin,  history of NSTEMI CAD status post PCI and 2 stents cannot rule out GI origin, see below  Was  seen by cardiology and Cath Lab performed on 08/2020, and found:  Severe single-vessel CAD with 99% stenosis of small OM2 branch. Mild  to moderate nonobstructive CAD in the left circumflex and RCA. Widely patent RPDA and RPL 1 stents. Severely elevated LVEDP 35 to 40 mmHg. Was on aspirin, Plavix, Lipitor, but patient states she only occasionally take aspirin We will trending troponin, EKG Urine drug screen pending rule out cocaine use cardiology consult, Dr.  Cammie Sickle notified  #Epigastric pain: Etiology unclear, possible gastritis versus PUD versus other Lipase with normal range Although patient reports black stool, however ED guaiac test negative CT abdomen pelvics negative Will empirically start on PPI Pain Management GI Consult if warranted  #Dysuria:  Possible UTI, UA pending  #Hypertensive urgency: Response to metoprolol  #Leukocytosis: Likely reactive, need to rule out infection, see above  #40 y.o. female with medical history significant of CAD status post stenting X 2,   hypertension,  nephrolithiasis, UTI, medication noncompliance presented with chest pain, epigastric pain, dysuria Established Active problems see above Continue home medications if appropriate follow-up with his PCP, specialist when necessary  Addendum at 9:49 PM: Urine drug screen positive for opiates and cannabis UA shows positive for UTI, patient has penicillin anaphylaxis history, chart review looks like patient was treated for UTI previously, not sure which antibiotics was used.  Pharmacist counsel ordered for antibiotic selection, then I discuss with pharmacist on the phone, from the record, patient previously was treated with Rocephin, no allergy reaction reported, thus Rocephin 1 g was ordered    DVT ppx: scd Code Status: Full code Family Communication: None at bed side.             Disposition Plan:  Anticipate discharge back to previous home environment Consults called:   Admission status: Obs / tele    Date of Service 04/28/2021    Lenna Sciara [facility]   If 7PM-7AM, please contact night-coverage [email or web address] Password [facility] 04/28/2021, 5:53 PM

## 2021-04-28 NOTE — ED Notes (Signed)
Patient to CT at this time

## 2021-04-28 NOTE — ED Triage Notes (Addendum)
Pt comes into the ED via EMS from home with c/o CP  Since firday, worse today with a hx of MI with stent placement, ASA 324mg , nitro paste, x1SL nitro.  States she has been out of her regular meds for the past 2 months due to cost  260/158 236/132 [id number]

## 2021-04-28 NOTE — ED Provider Notes (Signed)
[facility] Emergency Department Provider Note ____________________________________________   Event Date/Time   First MD Initiated Contact with Patient 04/28/21 1341     (approximate)  I have reviewed the triage vital signs and the nursing notes.  HISTORY  Chief Complaint Chest Pain  HPI Brenda Todd is a 40 y.o. female history of previous MI, cardiac stenting, NSTEMI hypertension  Patient reports she woke up this morning and felt like her 41-year-old was laying on her chest but found it was not.  She noticed she was having a very heavy chest pain.  It sits on top of her chest.  Is been persistent all day and also associated with some nausea and slight vomiting.  No shortness of breath.  She reports that she is not been able to comply with her metoprolol prescription due to cost issues around that as well as her other medications except occasionally takes a baby aspirin  She feels similar to how she felt when she had a heart attack a couple years ago  She does not currently have a cardiologist  Administered 324 mg aspirin as well as Nitropaste by EMS  Past Medical History:  Diagnosis Date  . Chronic hypertension   . Kidney calculi   . MI (myocardial infarction) ([place])   . UTI (lower urinary tract infection)     Patient Active Problem List   Diagnosis Date Noted  . Ovarian mass   . NSTEMI (non-ST elevated myocardial infarction) ([place]) 08/31/2020  . Problem with medical care compliance 01/25/2020  . Hypokalemia 01/25/2020  . Hypertensive urgency 01/25/2020  . Renal colic 56/43/3295  . History of MI (myocardial infarction) 01/24/2020  . Ischemic chest pain ([place]) 01/24/2020  . Postpartum care following cesarean delivery 12/17/2015  . Elevated blood pressure affecting pregnancy in third trimester, antepartum 12/08/2015  . Preeclampsia, severe 12/08/2015  . Nephrolithiasis 10/12/2015  . Supervision of high-risk pregnancy 10/11/2015  . Labor and  delivery, indication for care 10/11/2015  . Hypertension   . Cramping affecting pregnancy, antepartum 09/18/2015  . Elevated BP 09/02/2015  . Pyelonephritis affecting pregnancy in second trimester, antepartum 08/08/2015  . Kidney infection 08/06/2015  . Pyelonephritis affecting pregnancy in second trimester 07/13/2015  . History of stillbirth 07/10/2015    Past Surgical History:  Procedure Laterality Date  . APPENDECTOMY     l/s. ruptured  . CARDIAC SURGERY    . CESAREAN SECTION N/A 12/17/2015   Procedure: CESAREAN SECTION;  Surgeon: Honor Loh Ward, MD;  Location: [facility] ORS;  Service: Obstetrics;  Laterality: N/A;  . LEFT HEART CATH AND CORONARY ANGIOGRAPHY N/A 09/02/2020   Procedure: LEFT HEART CATH AND CORONARY ANGIOGRAPHY;  Surgeon: Nelva Bush, MD;  Location: [facility];  Service: Cardiovascular;  Laterality: N/A;  . LITHOTRIPSY      Prior to Admission medications   Medication Sig Start Date End Date Taking? Authorizing Provider  aspirin 81 MG chewable tablet Chew 81 mg by mouth daily.    [provider]  atorvastatin (LIPITOR) 80 MG tablet Take 1 tablet (80 mg total) by mouth daily. 09/04/20 10/04/20  Max Sane, MD  diphenhydrAMINE (BENADRYL) 25 MG tablet Take 25 mg by mouth every 6 (six) hours as needed.    [provider]  isosorbide mononitrate (IMDUR) 30 MG 24 hr tablet Take 1 tablet (30 mg total) by mouth daily. 09/04/20 10/04/20  Max Sane, MD  metoprolol tartrate (LOPRESSOR) 25 MG tablet Take 25 mg by mouth 2 (two) times daily.  [provider]    Allergies No healthtouch food allergies and Penicillins  No family history on file.  Social History Social History   Tobacco Use  . Smoking status: Current Every Day Smoker    Packs/day: 0.25    Types: Cigarettes  . Smokeless tobacco: Never Used  Vaping Use  . Vaping Use: Never used  Substance Use Topics  . Alcohol use: No    Alcohol/week: 0.0 standard drinks  . Drug  use: Yes    Types: Marijuana    Review of Systems Constitutional: No fever/chills or recent illness but did feel little bit fatigued today due to ago Eyes: No visual changes. ENT: No sore throat. Cardiovascular: See HPI Respiratory: Denies shortness of breath. Gastrointestinal: No abdominal pain.  Denies pregnancy.  Previous tubal ligation Genitourinary: Negative for dysuria.  Denies pregnancy.  Patient has had previous tubal ligation. Musculoskeletal: Pain in the chest does seem to radiate slightly towards the back and a little bit towards the mid abdomen. Skin: Negative for rash. Neurological: Negative for areas of focal weakness or numbness.  Mild headache after starting nitroglycerin with EMS    ____________________________________________   PHYSICAL EXAM:  VITAL SIGNS: ED Triage Vitals  Enc Vitals Group     BP 04/28/21 1330 (!) 192/113     Pulse Rate 04/28/21 1330 91     Resp 04/28/21 1330 (!) 28     Temp 04/28/21 1332 99.4 F (37.4 C)     Temp Source 04/28/21 1330 Oral     SpO2 04/28/21 1330 98 %     Weight 04/28/21 1327 172 lb 14.4 oz (78.4 kg)     Height 04/28/21 1327 5\' 5"  (1.651 m)     Head Circumference --      Peak Flow --      Pain Score 04/28/21 1327 9     Pain Loc --      Pain Edu? --      Excl. in [place]? --     Constitutional: Alert and oriented. Well appearing and in no acute distress.  He does appear quite uncomfortable and reports a pretty significant chest pressure Eyes: Conjunctivae are normal. Head: Atraumatic. Nose: No congestion/rhinnorhea. Mouth/Throat: Mucous membranes are moist. Neck: No stridor.  Cardiovascular: Normal rate, regular rhythm. Grossly normal heart sounds.  Good peripheral circulation.  Nitropaste left upper chest wall. Respiratory: Normal respiratory effort.  No retractions. Lungs CTAB. Gastrointestinal: Soft and nontender that she does report some moderate tenderness to palpation of the epigastrium and also in the right upper  quadrant though not definitively a positive Murphy. No distention. Musculoskeletal: No lower extremity tenderness nor edema.  Well perfused all extremities. Neurologic:  Normal speech and language. No gross focal neurologic deficits are appreciated.  Skin:  Skin is warm, dry and intact. No rash noted. Psychiatric: Mood and affect are normal to slightly anxious. Speech and behavior are normal.  ____________________________________________   LABS (all labs ordered are listed, but only abnormal results are displayed)  Labs Reviewed  BASIC METABOLIC PANEL - Abnormal; Notable for the following components:      Result Value   Potassium 3.4 (*)    CO2 20 (*)    Glucose, Bld 119 (*)    All other components within normal limits  CBC - Abnormal; Notable for the following components:   WBC 15.8 (*)    RBC 5.86 (*)    Hemoglobin 15.1 (*)    MCV 77.1 (*)    MCH 25.8 (*)  All other components within normal limits  RESP PANEL BY RT-PCR (FLU A&B, COVID) ARPGX2  HEPATIC FUNCTION PANEL  PROTIME-INR  LIPASE, BLOOD  POC URINE PREG, ED  TROPONIN I (HIGH SENSITIVITY)  TROPONIN I (HIGH SENSITIVITY)   ____________________________________________  EKG  Reviewed inter by me at 1339 heart rate 90 QRS 89 QTc 450 Normal sinus rhythm, probable LVH.  Repolarization abnormality is present, but favors that of LVH, do not see indication of STEMI ____________________________________________  RADIOLOGY  DG Chest 2 View  Result Date: 04/28/2021 CLINICAL DATA:  Chest pain and shortness of breath which began 04/24/2021 EXAM: CHEST - 2 VIEW COMPARISON:  Radiograph 08/31/2020, CT 08/31/2020 FINDINGS: No consolidation, features of edema, pneumothorax, or effusion. Pulmonary vascularity is normally distributed. The cardiomediastinal contours are unremarkable. No acute osseous or soft tissue abnormality. IMPRESSION: No acute cardiopulmonary abnormality. Electronically Signed   By: Lovena Le M.D.   On:  04/28/2021 14:14   Chest x-ray reviewed negative for acute.  Normal mediastinum   CT angiogram ordered to exclude dissection given the patient's symptoms, severe hypertensive event, and radiation of the pain somewhat to her back and upper abdomen.  Dr. Cherylann Banas to follow-up on result ____________________________________________   PROCEDURES  Procedure(s) performed: None  Procedures  Critical Care performed: Yes, see critical care note(s)  CRITICAL CARE Performed by: Delman Kitten   Total critical care time: 25 minutes  Critical care time was exclusive of separately billable procedures and treating other patients.  Critical care was necessary to treat or prevent imminent or life-threatening deterioration.  Critical care was time spent personally by me on the following activities: development of treatment plan with patient and/or surrogate as well as nursing, discussions with consultants, evaluation of patient's response to treatment, examination of patient, obtaining history from patient or surrogate, ordering and performing treatments and interventions, ordering and review of laboratory studies, ordering and review of radiographic studies, pulse oximetry and re-evaluation of patient's condition.  Patient presents with chest pain and severe systolic and diastolic hypertension.  Differential diagnosis includes hypertensive emergency, ACS, dissection, or other acute chest and intra-abdominal process.  I am very concerned about the severity of her hypertension in the setting of her reported symptoms.  She is felt high risk for acute coronary syndrome  Patient receiving 2 doses of antihypertensive IV. ____________________________________________   INITIAL IMPRESSION / ASSESSMENT AND PLAN / ED COURSE  Pertinent labs & imaging results that were available during my care of the patient were reviewed by me and considered in my medical decision making (see chart for details).   Differential  diagnosis includes, but is not limited to, ACS, aortic dissection, pulmonary embolism, cardiac tamponade, pneumothorax, pneumonia, pericarditis, myocarditis, GI-related causes including esophagitis/gastritis, and musculoskeletal chest wall pain.     Clinical Course as of 04/28/21 1524  Tue Apr 28, 2021  1502 Patient pain and pressure in her chest have improved mildly but still having moderate discomfort.  Blood pressure still 650 systolic.  Blood pressure is improving but still symptomatic of what appears to be hypertensive emergency.  Her troponin however at this time is normal, but await CT angiogram.  We will give a small additional dose of IV metoprolol as well as trial fentanyl for pain at this point. [MQ]  1521 Ongoing care and disposition assigned to Dr. Cherylann Banas.  Anticipate admission to the hospital for hypertensive urgency possible hypertensive emergency.  Pending CT angiogram to further evaluate for cause of chest pain and some upper abdominal pain on exam.  Differential diagnosis remains blotted broad, but I have significant concern for ACS, hypertensive emergency, or potentially upper abdominal process such as cholecystitis etc. though clinical history seems to be most suggestive of chest discomfort as the primary presentation [MQ]    Clinical Course User Index [MQ] Delman Kitten, MD     ____________________________________________   FINAL CLINICAL IMPRESSION(S) / ED DIAGNOSES  Final diagnoses:  Hypertensive emergency without congestive heart failure  Chest pain with high risk for cardiac etiology  Epigastric abdominal pain        Note:  This document was prepared using Dragon voice recognition software and may include unintentional dictation errors       Delman Kitten, MD 04/28/21 1524

## 2021-04-29 LAB — BASIC METABOLIC PANEL
Anion gap: 8 (ref 5–15)
BUN: 21 mg/dL — ABNORMAL HIGH (ref 6–20)
CO2: 26 mmol/L (ref 22–32)
Calcium: 8.8 mg/dL — ABNORMAL LOW (ref 8.9–10.3)
Chloride: 103 mmol/L (ref 98–111)
Creatinine, Ser: 0.73 mg/dL (ref 0.44–1.00)
GFR, Estimated: >60 mL/min (ref >60)
Glucose, Bld: 107 mg/dL — ABNORMAL HIGH (ref 70–99)
Potassium: 3.5 mmol/L (ref 3.5–5.1)
Sodium: 137 mmol/L (ref 135–145)

## 2021-04-29 LAB — CBC WITH DIFFERENTIAL/PLATELET
Abs Immature Granulocytes: 0.04 10*3/uL (ref 0.00–0.07)
Basophils Absolute: 0.1 10*3/uL (ref 0.0–0.1)
Basophils Relative: 1 %
Eosinophils Absolute: 0.2 10*3/uL (ref 0.0–0.5)
Eosinophils Relative: 2 %
HCT: 42 % (ref 36.0–46.0)
Hemoglobin: 13.6 g/dL (ref 12.0–15.0)
Immature Granulocytes: 0 %
Lymphocytes Relative: 30 %
Lymphs Abs: 3.5 10*3/uL (ref 0.7–4.0)
MCH: 25.9 pg — ABNORMAL LOW (ref 26.0–34.0)
MCHC: 32.4 g/dL (ref 30.0–36.0)
MCV: 79.8 fL — ABNORMAL LOW (ref 80.0–100.0)
Monocytes Absolute: 0.9 10*3/uL (ref 0.1–1.0)
Monocytes Relative: 7 %
Neutro Abs: 7 10*3/uL (ref 1.7–7.7)
Neutrophils Relative %: 60 %
Platelets: 235 10*3/uL (ref 150–400)
RBC: 5.26 MIL/uL — ABNORMAL HIGH (ref 3.87–5.11)
RDW: 14.9 % (ref 11.5–15.5)
WBC: 11.6 10*3/uL — ABNORMAL HIGH (ref 4.0–10.5)
nRBC: 0 % (ref 0.0–0.2)

## 2021-04-29 LAB — HIV ANTIBODY (ROUTINE TESTING W REFLEX): HIV Screen 4th Generation wRfx: NONREACTIVE

## 2021-04-29 LAB — TROPONIN I (HIGH SENSITIVITY): Troponin I (High Sensitivity): 11 ng/L (ref <18)

## 2021-04-29 MED ORDER — ATORVASTATIN CALCIUM 80 MG PO TABS: 80.0000 mg | ORAL_TABLET | Freq: Every day | ORAL | 0 refills | Status: DC

## 2021-04-29 MED ORDER — AMLODIPINE BESYLATE 10 MG PO TABS: 10.0000 mg | ORAL_TABLET | Freq: Every day | ORAL | 1 refills | Status: DC

## 2021-04-29 MED ORDER — ASPIRIN 81 MG PO CHEW: 81.0000 mg | CHEWABLE_TABLET | Freq: Every day | ORAL | 1 refills | Status: AC

## 2021-04-29 MED ORDER — METOPROLOL TARTRATE 25 MG PO TABS: 25.0000 mg | ORAL_TABLET | Freq: Two times a day (BID) | ORAL | 1 refills | Status: DC

## 2021-04-29 MED ORDER — AMLODIPINE BESYLATE 5 MG PO TABS
5.0000 mg | ORAL_TABLET | Freq: Every day | ORAL | Status: DC
  Administered 2021-04-29: 5 mg via ORAL
  Filled 2021-04-29: qty 1

## 2021-04-29 MED ORDER — NITROFURANTOIN MONOHYD MACRO 100 MG PO CAPS: 100.0000 mg | ORAL_CAPSULE | Freq: Two times a day (BID) | ORAL | 0 refills | Status: AC

## 2021-04-29 MED ORDER — ISOSORBIDE MONONITRATE ER 30 MG PO TB24: 30.0000 mg | ORAL_TABLET | Freq: Every day | ORAL | 0 refills | Status: DC

## 2021-04-29 MED ORDER — AMLODIPINE BESYLATE 10 MG PO TABS: 10.0000 mg | ORAL_TABLET | Freq: Every day | ORAL | Status: DC

## 2021-04-29 NOTE — Progress Notes (Signed)
Patient discharged to home at this time with all belongings. PIV/Tele removed by Probation officer. Patient verbalized understanding of printed AVS. NAD noted upon departure.

## 2021-04-29 NOTE — Discharge Summary (Signed)
[name] PQD:[id number] DOB: 05-08-1981 DOA: 04/28/2021  PCP: Patient, No Pcp Per (Inactive)  Admit date: 04/28/2021 Discharge date: 04/29/2021  Time spent: 35 minutes  Recommendations for Outpatient Follow-up:  1. Close cardiology f/u 2. Establish with pcp     Discharge Diagnoses:  Active Problems:   Hypertensive urgency   Chest pain   Epigastric pain   Dysuria   Leukocytosis   Discharge Condition: stable  Diet recommendation: heart healthy  Filed Weights   04/28/21 2023 04/29/21 0627 04/29/21 0900  Weight: 77.3 kg 77.9 kg 77.9 kg    History of present illness:  INIYA Todd is a 40 y.o. female with medical history significant of CAD status post stenting X 2,   hypertension,  nephrolithiasis, UTI, medication noncompliance presented with chest pain, epigastric pain, dysuria  Per patient, chart review, patient states since Friday she did not feel well, this morning and felt like her 63-year-old was laying on her chest but found it was not.  Also have epigastric pain, nausea but significant vomiting, no diarrhea, states feels similar to her previous heart attack couple years ago. also status has some dysuria, reports possible black stool, but ED guaiac test negative.  She went to the ED Patient reports no fever, chills, shortness of breath, cough,  leg swelling. Patient states she has been taking her metoprolol, Brilinta for the last 2 months because she cannot afford, she occasionally take a baby aspirin States marijuana use, last used 2 days ago  Hospital Course:  Pt with history nstemi, has not had cardiology f/u nor compliance w/ home meds. Presented with substernal chest pain/pressure. CTA neg, troponins negative, evaluated by cardiology and advised no intervention, resume home meds and f/u with cardiology in one week. Patient reported dark stools, no anemia found, guaiac negative, ct of abdomen unrevealing, advised pcp f/u if persists. Pt did report dysuria and  urinalysis suggestive of possible infection, shared decision to treat with macrobid. Patient was hypertensive on admission, amlodipine was started.   Procedures:  none   Consultations:  cardiology  Discharge Exam: Vitals:   04/29/21 0438 04/29/21 0743  BP: (!) 177/93 (!) 166/99  Pulse: 64 82  Resp: 18 18  Temp: 98 F (36.7 C) 98.1 F (36.7 C)  SpO2: 100% 100%    General: nad Cardiovascular: rrr, no rub/murmur Respiratory: ctab Abdomen: soft, non-tender Ext: no edema  Discharge Instructions   Discharge Instructions    Diet - low sodium heart healthy   Complete by: As directed    Increase activity slowly   Complete by: As directed      Allergies as of 04/29/2021      Reactions   No Healthtouch Food Allergies Anaphylaxis, Other (See Comments)   Cherries   Penicillins Anaphylaxis      Medication List    STOP taking these medications   diphenhydrAMINE 25 MG tablet Commonly known as: BENADRYL     TAKE these medications   amLODipine 10 MG tablet Commonly known as: NORVASC Take 1 tablet (10 mg total) by mouth daily. Start taking on: Apr 30, 2021   aspirin 81 MG chewable tablet Chew 1 tablet (81 mg total) by mouth daily.   atorvastatin 80 MG tablet Commonly known as: LIPITOR Take 1 tablet (80 mg total) by mouth daily.   isosorbide mononitrate 30 MG 24 hr tablet Commonly known as: IMDUR Take 1 tablet (30 mg total) by mouth daily.   metoprolol tartrate 25 MG tablet Commonly known as: LOPRESSOR Take 1  tablet (25 mg total) by mouth 2 (two) times daily.   nitrofurantoin (macrocrystal-monohydrate) 100 MG capsule Commonly known as: Macrobid Take 1 capsule (100 mg total) by mouth 2 (two) times daily for 4 days.      Allergies  Allergen Reactions  . No Healthtouch Food Allergies Anaphylaxis and Other (See Comments)    Cherries  . Penicillins Anaphylaxis    Follow-up Information    Corey Skains, MD Follow up in 1 week(s).   Specialty:  Cardiology Contact information: [address] [facility] [place] [address] [phone]                The results of significant diagnostics from this hospitalization (including imaging, microbiology, ancillary and laboratory) are listed below for reference.    Significant Diagnostic Studies: DG Chest 2 View  Result Date: 04/28/2021 CLINICAL DATA:  Chest pain and shortness of breath which began 04/24/2021 EXAM: CHEST - 2 VIEW COMPARISON:  Radiograph 08/31/2020, CT 08/31/2020 FINDINGS: No consolidation, features of edema, pneumothorax, or effusion. Pulmonary vascularity is normally distributed. The cardiomediastinal contours are unremarkable. No acute osseous or soft tissue abnormality. IMPRESSION: No acute cardiopulmonary abnormality. Electronically Signed   By: Lovena Le M.D.   On: 04/28/2021 14:14   CT Angio Chest/Abd/Pel for Dissection W and/or Wo Contrast  Result Date: 04/28/2021 CLINICAL DATA:  Acute chest and back pain, concern for dissection. History of coronary disease and previous stenting. EXAM: CT ANGIOGRAPHY CHEST, ABDOMEN AND PELVIS TECHNIQUE: Non-contrast CT of the chest was initially obtained. Multidetector CT imaging through the chest, abdomen and pelvis was performed using the standard protocol during bolus administration of intravenous contrast. Multiplanar reconstructed images and MIPs were obtained and reviewed to evaluate the vascular anatomy. CONTRAST:  120mL OMNIPAQUE IOHEXOL 350 MG/ML SOLN COMPARISON:  08/31/2020 FINDINGS: CTA CHEST FINDINGS Cardiovascular: Initial noncontrast imaging demonstrates no definitive hyperdense intramural hematoma. No mediastinal hemorrhage or hematoma. Normal heart size. No pericardial effusion. Post-contrast, the thoracic aorta is intact. Negative for dissection. No aneurysm. Patent 3 vessel arch anatomy. No acute aortic finding. Visualized pulmonary arteries appear patent. No signal filling defect or  pulmonary embolus. Mediastinum/Nodes: No enlarged mediastinal, hilar, or axillary lymph nodes. Thyroid gland, trachea, and esophagus demonstrate no significant findings. Lungs/Pleura: Lungs are clear. No pleural effusion or pneumothorax. Musculoskeletal: No chest wall abnormality. No acute or significant osseous findings. Review of the MIP images confirms the above findings. CTA ABDOMEN AND PELVIS FINDINGS VASCULAR Aorta: Normal caliber aorta without aneurysm, dissection, vasculitis or significant stenosis. Celiac: Patent without evidence of aneurysm, dissection, vasculitis or significant stenosis. SMA: Patent without evidence of aneurysm, dissection, vasculitis or significant stenosis. Renals: Both renal arteries are patent without evidence of aneurysm, dissection, vasculitis, fibromuscular dysplasia or significant stenosis. IMA: Patent without evidence of aneurysm, dissection, vasculitis or significant stenosis. Inflow: Patent without evidence of aneurysm, dissection, vasculitis or significant stenosis. Veins: Dedicated venous imaging not performed Review of the MIP images confirms the above findings. NON-VASCULAR Hepatobiliary: No focal liver abnormality is seen. No gallstones, gallbladder wall thickening, or biliary dilatation. Pancreas: Unremarkable. No pancreatic ductal dilatation or surrounding inflammatory changes. Spleen: Normal in size without focal abnormality. Adrenals/Urinary Tract: Adrenal glands are unremarkable. Kidneys are normal, without renal calculi, focal lesion, or hydronephrosis. Bladder is unremarkable. Stomach/Bowel: Stomach is within normal limits. Previous appendectomy noted. No evidence of bowel wall thickening, distention, or inflammatory changes. Lymphatic: No bulky adenopathy. Reproductive: Uterus and bilateral adnexa are unremarkable. Other: No abdominal wall hernia or abnormality. No abdominopelvic ascites.  Musculoskeletal: No acute or significant osseous findings. Review of the MIP  images confirms the above findings. IMPRESSION: Intact thoracoabdominal aorta.  Negative for dissection. No other acute intrathoracic, abdominal or pelvic finding by CTA. Electronically Signed   By: Jerilynn Mages.  Shick M.D.   On: 04/28/2021 15:53    Microbiology: Recent Results (from the past 240 hour(s))  Resp Panel by RT-PCR (Flu A&B, Covid) Nasopharyngeal Swab     Status: None   Collection Time: 04/28/21  2:26 PM   Specimen: Nasopharyngeal Swab; Nasopharyngeal(NP) swabs in vial transport medium  Result Value Ref Range Status   SARS Coronavirus 2 by RT PCR NEGATIVE NEGATIVE Final    Comment: (NOTE) SARS-CoV-2 target nucleic acids are NOT DETECTED.  The SARS-CoV-2 RNA is generally detectable in upper respiratory specimens during the acute phase of infection. The lowest concentration of SARS-CoV-2 viral copies this assay can detect is 138 copies/mL. A negative result does not preclude SARS-Cov-2 infection and should not be used as the sole basis for treatment or other patient management decisions. A negative result may occur with  improper specimen collection/handling, submission of specimen other than nasopharyngeal swab, presence of viral mutation(s) within the areas targeted by this assay, and inadequate number of viral copies(<138 copies/mL). A negative result must be combined with clinical observations, patient history, and epidemiological information. The expected result is Negative.  Fact Sheet for Patients:  EntrepreneurPulse.com.au  Fact Sheet for Healthcare Providers:  IncredibleEmployment.be  This test is no t yet approved or cleared by the [place] FDA and  has been authorized for detection and/or diagnosis of SARS-CoV-2 by FDA under an Emergency Use Authorization (EUA). This EUA will remain  in effect (meaning this test can be used) for the duration of the COVID-19 declaration under Section 564(b)(1) of the Act, 21 U.S.C.section  360bbb-3(b)(1), unless the authorization is terminated  or revoked sooner.       Influenza A by PCR NEGATIVE NEGATIVE Final   Influenza B by PCR NEGATIVE NEGATIVE Final    Comment: (NOTE) The Xpert Xpress SARS-CoV-2/FLU/RSV plus assay is intended as an aid in the diagnosis of influenza from Nasopharyngeal swab specimens and should not be used as a sole basis for treatment. Nasal washings and aspirates are unacceptable for Xpert Xpress SARS-CoV-2/FLU/RSV testing.  Fact Sheet for Patients: EntrepreneurPulse.com.au  Fact Sheet for Healthcare Providers: IncredibleEmployment.be  This test is not yet approved or cleared by the [place] FDA and has been authorized for detection and/or diagnosis of SARS-CoV-2 by FDA under an Emergency Use Authorization (EUA). This EUA will remain in effect (meaning this test can be used) for the duration of the COVID-19 declaration under Section 564(b)(1) of the Act, 21 U.S.C. section 360bbb-3(b)(1), unless the authorization is terminated or revoked.  Performed at [facility], [address] [place],  [address]      Labs: Basic Metabolic Panel: Recent Labs  Lab 04/28/21 1332 04/29/21 0353  NA 136 137  K 3.4* 3.5  CL 104 103  CO2 20* 26  GLUCOSE 119* 107*  BUN 16 21*  CREATININE 0.78 0.73  CALCIUM 9.0 8.8*   Liver Function Tests: Recent Labs  Lab 04/28/21 1332  AST 18  ALT 16  ALKPHOS 56  BILITOT 0.8  PROT 7.6  ALBUMIN 4.0   Recent Labs  Lab 04/28/21 1332  LIPASE 23   No results for input(s): AMMONIA in the last 168 hours. CBC: Recent Labs  Lab 04/28/21 1332 04/29/21 0353  WBC 15.8* 11.6*  NEUTROABS  --  7.0  HGB 15.1* 13.6  HCT 45.2 42.0  MCV 77.1* 79.8*  PLT 269 235   Cardiac Enzymes: No results for input(s): CKTOTAL, CKMB, CKMBINDEX, TROPONINI in the last 168 hours. BNP: BNP (last 3 results) No results for input(s): BNP in the last 8760  hours.  ProBNP (last 3 results) No results for input(s): PROBNP in the last 8760 hours.  CBG: No results for input(s): GLUCAP in the last 168 hours.     Signed:  Desma Maxim MD.  [facility] 04/29/2021, 11:03 AM

## 2021-04-29 NOTE — Progress Notes (Signed)
   04/29/21 1025  Clinical Encounter Type  Visited With Patient  Visit Type Spiritual support;Social support  Referral From Chaplain  Consult/Referral To [facility] visited with Brenda Todd who is awaiting discharge. Chaplain Burris provided reflective listening and support. Chaplain Burris encouraged self-care and continued rest.

## 2021-04-29 NOTE — Plan of Care (Signed)
Patient admit this shift for chest pain. Patient with chest pain but relieved with PO pain medication. VSS, BP elevated but trending down. NAD observed. See assessment for details.    Problem: Education: Goal: Knowledge of General Education information will improve Description: Including pain rating scale, medication(s)/side effects and non-pharmacologic comfort measures Outcome: Progressing   Problem: Health Behavior/Discharge Planning: Goal: Ability to manage health-related needs will improve Outcome: Progressing   Problem: Clinical Measurements: Goal: Ability to maintain clinical measurements within normal limits will improve Outcome: Progressing Goal: Will remain free from infection Outcome: Progressing Goal: Diagnostic test results will improve Outcome: Progressing Goal: Respiratory complications will improve Outcome: Progressing Goal: Cardiovascular complication will be avoided Outcome: Progressing   Problem: Activity: Goal: Risk for activity intolerance will decrease Outcome: Progressing   Problem: Nutrition: Goal: Adequate nutrition will be maintained Outcome: Progressing   Problem: Coping: Goal: Level of anxiety will decrease Outcome: Progressing   Problem: Elimination: Goal: Will not experience complications related to bowel motility Outcome: Progressing Goal: Will not experience complications related to urinary retention Outcome: Progressing   Problem: Pain Managment: Goal: General experience of comfort will improve Outcome: Progressing   Problem: Safety: Goal: Ability to remain free from injury will improve Outcome: Progressing   Problem: Skin Integrity: Goal: Risk for impaired skin integrity will decrease Outcome: Progressing   Problem: Education: Goal: Understanding of cardiac disease, CV risk reduction, and recovery process will improve Outcome: Progressing Goal: Understanding of medication regimen will improve Outcome: Progressing Goal:  Individualized Educational Video(s) Outcome: Progressing   Problem: Activity: Goal: Ability to tolerate increased activity will improve Outcome: Progressing   Problem: Cardiac: Goal: Ability to achieve and maintain adequate cardiopulmonary perfusion will improve Outcome: Progressing Goal: Vascular access site(s) Level 0-1 will be maintained Outcome: Progressing   Problem: Health Behavior/Discharge Planning: Goal: Ability to safely manage health-related needs after discharge will improve Outcome: Progressing

## 2021-04-29 NOTE — Consult Note (Signed)
[facility] Consultation Note  Patient ID: Brenda Todd, MRN: [id number], DOB/AGE: [birth year] 40 y.o. Admit date: 04/28/2021   Date of Consult: 04/29/2021 Primary Physician: Patient, No Pcp Per (Inactive) Primary Cardiologist: None  Chief Complaint:  Chief Complaint  Patient presents with  . Chest Pain   Reason for Consult: Chest pain  HPI: 40 y.o. female with no evidence of cardiovascular risk factors but apparently had primary cardiovascular event several years ago for which she had a stent placement in her PDA and posterior lateral artery.  She also had small vessel disease of obtuse marginal artery as well.  She was placed on appropriate medications in the past for which the patient has done relatively well but has not been as compliant as necessary.  In September 2021 the patient had recurrent episode of chest discomfort with cardiac catheterization showing patent stent in PL and PDA with small vessel coronary artery disease of obtuse marginal and an echocardiogram showing normal LV systolic function with ejection fraction 50%.  The patient now has been admitted for chest heaviness for the last 2 days constant in nature not associated with deep breathing and/or exercise.  The patient has had an EKG showing normal sinus rhythm otherwise normal EKG.  Troponin is 8, 10, and 11 consistent with no current evidence of acute coronary syndrome or myocardial infarction.  There is no other significant cardiovascular concerns.  Patient currently still feels a slight heaviness this morning but no evidence of true cardiovascular symptoms this AM.  Overall the patient is hemodynamically stable  Past Medical History:  Diagnosis Date  . Chronic hypertension   . Kidney calculi   . MI (myocardial infarction) ([place])   . UTI (lower urinary tract infection)       Surgical History:  Past Surgical History:  Procedure Laterality Date  . APPENDECTOMY     l/s. ruptured  . CARDIAC SURGERY    .  CESAREAN SECTION N/A 12/17/2015   Procedure: CESAREAN SECTION;  Surgeon: Honor Loh Ward, MD;  Location: [facility] ORS;  Service: Obstetrics;  Laterality: N/A;  . LEFT HEART CATH AND CORONARY ANGIOGRAPHY N/A 09/02/2020   Procedure: LEFT HEART CATH AND CORONARY ANGIOGRAPHY;  Surgeon: Nelva Bush, MD;  Location: [facility];  Service: Cardiovascular;  Laterality: N/A;  . LITHOTRIPSY       Home Meds: Prior to Admission medications   Medication Sig Start Date End Date Taking? Authorizing Provider  aspirin 81 MG chewable tablet Chew 81 mg by mouth daily.   Yes [provider]  atorvastatin (LIPITOR) 80 MG tablet Take 1 tablet (80 mg total) by mouth daily. 09/04/20 10/04/20  Max Sane, MD  diphenhydrAMINE (BENADRYL) 25 MG tablet Take 25 mg by mouth every 6 (six) hours as needed.    [provider]  isosorbide mononitrate (IMDUR) 30 MG 24 hr tablet Take 1 tablet (30 mg total) by mouth daily. 09/04/20 10/04/20  Max Sane, MD  metoprolol tartrate (LOPRESSOR) 25 MG tablet Take 25 mg by mouth 2 (two) times daily.    [provider]    Inpatient Medications:  . aspirin EC  81 mg Oral Daily  . atorvastatin  80 mg Oral QHS  . metoprolol tartrate  12.5 mg Oral BID  . pantoprazole (PROTONIX) IV  40 mg Intravenous Q12H   . cefTRIAXone (ROCEPHIN)  IV 1 g (04/28/21 2300)    Allergies:  Allergies  Allergen Reactions  . No [employer] Food Allergies Anaphylaxis and Other (See Comments)  Cherries  . Penicillins Anaphylaxis    Social History   Socioeconomic History  . Marital status: Divorced    Spouse name: Not on file  . Number of children: Not on file  . Years of education: Not on file  . Highest education level: Not on file  Occupational History  . Not on file  Tobacco Use  . Smoking status: Current Every Day Smoker    Packs/day: 0.25    Types: Cigarettes  . Smokeless tobacco: Never Used  Vaping Use  . Vaping Use: Never used  Substance and Sexual  Activity  . Alcohol use: No    Alcohol/week: 0.0 standard drinks  . Drug use: Yes    Types: Marijuana  . Sexual activity: Yes    Birth control/protection: Condom  Other Topics Concern  . Not on file  Social History Narrative  . Not on file   Social Determinants of Health   Financial Resource Strain: Not on file  Food Insecurity: Not on file  Transportation Needs: Not on file  Physical Activity: Not on file  Stress: Not on file  Social Connections: Not on file  Intimate Partner Violence: Not on file     No family history on file.   Review of Systems Positive for chest heaviness Negative for: General:  chills, fever, night sweats or weight changes.  Cardiovascular: PND orthopnea syncope dizziness  Dermatological skin lesions rashes Respiratory: Cough congestion Urologic: Frequent urination urination at night and hematuria Abdominal: negative for nausea, vomiting, diarrhea, bright red blood per rectum, melena, or hematemesis Neurologic: negative for visual changes, and/or hearing changes  All other systems reviewed and are otherwise negative except as noted above.  Labs: No results for input(s): CKTOTAL, CKMB, TROPONINI in the last 72 hours. Lab Results  Component Value Date   WBC 11.6 (H) 04/29/2021   HGB 13.6 04/29/2021   HCT 42.0 04/29/2021   MCV 79.8 (L) 04/29/2021   PLT 235 04/29/2021    Recent Labs  Lab 04/28/21 1332 04/29/21 0353  NA 136 137  K 3.4* 3.5  CL 104 103  CO2 20* 26  BUN 16 21*  CREATININE 0.78 0.73  CALCIUM 9.0 8.8*  PROT 7.6  --   BILITOT 0.8  --   ALKPHOS 56  --   ALT 16  --   AST 18  --   GLUCOSE 119* 107*   Lab Results  Component Value Date   CHOL 180 09/01/2020   HDL 35 (L) 09/01/2020   LDLCALC 95 09/01/2020   TRIG 250 (H) 09/01/2020   No results found for: DDIMER  Radiology/Studies:  DG Chest 2 View  Result Date: 04/28/2021 CLINICAL DATA:  Chest pain and shortness of breath which began 04/24/2021 EXAM: CHEST - 2 VIEW  COMPARISON:  Radiograph 08/31/2020, CT 08/31/2020 FINDINGS: No consolidation, features of edema, pneumothorax, or effusion. Pulmonary vascularity is normally distributed. The cardiomediastinal contours are unremarkable. No acute osseous or soft tissue abnormality. IMPRESSION: No acute cardiopulmonary abnormality. Electronically Signed   By: Lovena Le M.D.   On: 04/28/2021 14:14   CT Angio Chest/Abd/Pel for Dissection W and/or Wo Contrast  Result Date: 04/28/2021 CLINICAL DATA:  Acute chest and back pain, concern for dissection. History of coronary disease and previous stenting. EXAM: CT ANGIOGRAPHY CHEST, ABDOMEN AND PELVIS TECHNIQUE: Non-contrast CT of the chest was initially obtained. Multidetector CT imaging through the chest, abdomen and pelvis was performed using the standard protocol during bolus administration of intravenous contrast. Multiplanar reconstructed images and MIPs  were obtained and reviewed to evaluate the vascular anatomy. CONTRAST:  12mL OMNIPAQUE IOHEXOL 350 MG/ML SOLN COMPARISON:  08/31/2020 FINDINGS: CTA CHEST FINDINGS Cardiovascular: Initial noncontrast imaging demonstrates no definitive hyperdense intramural hematoma. No mediastinal hemorrhage or hematoma. Normal heart size. No pericardial effusion. Post-contrast, the thoracic aorta is intact. Negative for dissection. No aneurysm. Patent 3 vessel arch anatomy. No acute aortic finding. Visualized pulmonary arteries appear patent. No signal filling defect or pulmonary embolus. Mediastinum/Nodes: No enlarged mediastinal, hilar, or axillary lymph nodes. Thyroid gland, trachea, and esophagus demonstrate no significant findings. Lungs/Pleura: Lungs are clear. No pleural effusion or pneumothorax. Musculoskeletal: No chest wall abnormality. No acute or significant osseous findings. Review of the MIP images confirms the above findings. CTA ABDOMEN AND PELVIS FINDINGS VASCULAR Aorta: Normal caliber aorta without aneurysm, dissection,  vasculitis or significant stenosis. Celiac: Patent without evidence of aneurysm, dissection, vasculitis or significant stenosis. SMA: Patent without evidence of aneurysm, dissection, vasculitis or significant stenosis. Renals: Both renal arteries are patent without evidence of aneurysm, dissection, vasculitis, fibromuscular dysplasia or significant stenosis. IMA: Patent without evidence of aneurysm, dissection, vasculitis or significant stenosis. Inflow: Patent without evidence of aneurysm, dissection, vasculitis or significant stenosis. Veins: Dedicated venous imaging not performed Review of the MIP images confirms the above findings. NON-VASCULAR Hepatobiliary: No focal liver abnormality is seen. No gallstones, gallbladder wall thickening, or biliary dilatation. Pancreas: Unremarkable. No pancreatic ductal dilatation or surrounding inflammatory changes. Spleen: Normal in size without focal abnormality. Adrenals/Urinary Tract: Adrenal glands are unremarkable. Kidneys are normal, without renal calculi, focal lesion, or hydronephrosis. Bladder is unremarkable. Stomach/Bowel: Stomach is within normal limits. Previous appendectomy noted. No evidence of bowel wall thickening, distention, or inflammatory changes. Lymphatic: No bulky adenopathy. Reproductive: Uterus and bilateral adnexa are unremarkable. Other: No abdominal wall hernia or abnormality. No abdominopelvic ascites. Musculoskeletal: No acute or significant osseous findings. Review of the MIP images confirms the above findings. IMPRESSION: Intact thoracoabdominal aorta.  Negative for dissection. No other acute intrathoracic, abdominal or pelvic finding by CTA. Electronically Signed   By: Jerilynn Mages.  Shick M.D.   On: 04/28/2021 15:53    EKG: Normal sinus rhythm  Weights: Filed Weights   04/28/21 1327 04/28/21 2023 04/29/21 0627  Weight: 78.4 kg 77.3 kg 77.9 kg     Physical Exam: Blood pressure (!) 166/99, pulse 82, temperature 98.1 F (36.7 C), resp. rate  18, height 5\' 5"  (1.651 m), weight 77.9 kg, last menstrual period 04/05/2021, SpO2 100 %. Body mass index is 28.57 kg/m. General: Well developed, well nourished, in no acute distress. Head eyes ears nose throat: Normocephalic, atraumatic, sclera non-icteric, no xanthomas, nares are without discharge. No apparent thyromegaly and/or mass  Lungs: Normal respiratory effort.  no wheezes, no rales, no rhonchi.  Heart: RRR with normal S1 S2. no murmur gallop, no rub, PMI is normal size and placement, carotid upstroke normal without bruit, jugular venous pressure is normal Abdomen: Soft, non-tender, non-distended with normoactive bowel sounds. No hepatomegaly. No rebound/guarding. No obvious abdominal masses. Abdominal aorta is normal size without bruit Extremities: No edema. no cyanosis, no clubbing, no ulcers  Peripheral : 2+ bilateral upper extremity pulses, 2+ bilateral femoral pulses, 2+ bilateral dorsal pedal pulse Neuro: Alert and oriented. No facial asymmetry. No focal deficit. Moves all extremities spontaneously. Musculoskeletal: Normal muscle tone without kyphosis Psych:  Responds to questions appropriately with a normal affect.    Assessment: 40 year old female with previous cardiovascular history on appropriate medication management at this point with overall chest heaviness atypical in nature without  evidence of myocardial infarction or congestive heart failure at this time  Plan: 1.  No further cardiac diagnostics necessary at this time due to no evidence of true angina and or myocardial infarction or acute coronary syndrome and/or congestive heart failure 2.  Continue aspirin, high intensity cholesterol therapy, and metoprolol for medication management for treatment of previous cardiovascular disease and prevention 3.  If ambulating well without evidence of significant symptoms or other concerns okay for discharge home from cardiac standpoint with follow-up next week  Signed, Corey Skains M.D. [facility] 04/29/2021, 8:43 AM

## 2021-04-30 LAB — URINE CULTURE

## 2021-09-10 ENCOUNTER — Encounter: Payer: Self-pay | Admitting: Emergency Medicine

## 2021-09-10 ENCOUNTER — Emergency Department
Admission: EM | Admit: 2021-09-10 | Discharge: 2021-09-11 | Disposition: A | Discharge status: Home / Self Care | Payer: Medicaid - Out of State | Attending: Emergency Medicine | Admitting: Emergency Medicine

## 2021-09-10 ENCOUNTER — Emergency Department: Payer: Medicaid - Out of State

## 2021-09-10 ENCOUNTER — Other Ambulatory Visit: Payer: Self-pay

## 2021-09-10 DIAGNOSIS — R55 Syncope and collapse: Secondary | ICD-10-CM | POA: Diagnosis not present

## 2021-09-10 DIAGNOSIS — F17210 Nicotine dependence, cigarettes, uncomplicated: Secondary | ICD-10-CM | POA: Diagnosis not present

## 2021-09-10 DIAGNOSIS — Z7982 Long term (current) use of aspirin: Secondary | ICD-10-CM | POA: Insufficient documentation

## 2021-09-10 DIAGNOSIS — R000 Tachycardia, unspecified: Secondary | ICD-10-CM | POA: Diagnosis not present

## 2021-09-10 DIAGNOSIS — R079 Chest pain, unspecified: Secondary | ICD-10-CM | POA: Diagnosis present

## 2021-09-10 DIAGNOSIS — I10 Essential (primary) hypertension: Secondary | ICD-10-CM | POA: Insufficient documentation

## 2021-09-10 DIAGNOSIS — R1013 Epigastric pain: Secondary | ICD-10-CM | POA: Insufficient documentation

## 2021-09-10 DIAGNOSIS — R1011 Right upper quadrant pain: Secondary | ICD-10-CM | POA: Diagnosis not present

## 2021-09-10 DIAGNOSIS — Z79899 Other long term (current) drug therapy: Secondary | ICD-10-CM | POA: Diagnosis not present

## 2021-09-10 LAB — CBC
HCT: 41.2 % (ref 36.0–46.0)
Hemoglobin: 14 g/dL (ref 12.0–15.0)
MCH: 27.5 pg (ref 26.0–34.0)
MCHC: 34 g/dL (ref 30.0–36.0)
MCV: 80.9 fL (ref 80.0–100.0)
Platelets: 271 10*3/uL (ref 150–400)
RBC: 5.09 MIL/uL (ref 3.87–5.11)
RDW: 13.3 % (ref 11.5–15.5)
WBC: 12.9 10*3/uL — ABNORMAL HIGH (ref 4.0–10.5)
nRBC: 0 % (ref 0.0–0.2)

## 2021-09-10 LAB — HEPATIC FUNCTION PANEL
ALT: 12 U/L (ref 0–44)
AST: 21 U/L (ref 15–41)
Albumin: 3.9 g/dL (ref 3.5–5.0)
Alkaline Phosphatase: 48 U/L (ref 38–126)
Bilirubin, Direct: 0.2 mg/dL (ref 0.0–0.2)
Indirect Bilirubin: 0.7 mg/dL (ref 0.3–0.9)
Total Bilirubin: 0.9 mg/dL (ref 0.3–1.2)
Total Protein: 7 g/dL (ref 6.5–8.1)

## 2021-09-10 LAB — BASIC METABOLIC PANEL
Anion gap: 10 (ref 5–15)
BUN: 17 mg/dL (ref 6–20)
CO2: 25 mmol/L (ref 22–32)
Calcium: 9 mg/dL (ref 8.9–10.3)
Chloride: 102 mmol/L (ref 98–111)
Creatinine, Ser: 0.77 mg/dL (ref 0.44–1.00)
GFR, Estimated: >60 mL/min (ref >60)
Glucose, Bld: 99 mg/dL (ref 70–99)
Potassium: 3.5 mmol/L (ref 3.5–5.1)
Sodium: 137 mmol/L (ref 135–145)

## 2021-09-10 LAB — POC URINE PREG, ED: Preg Test, Ur: NEGATIVE

## 2021-09-10 LAB — TROPONIN I (HIGH SENSITIVITY): Troponin I (High Sensitivity): 8 ng/L (ref <18)

## 2021-09-10 LAB — LIPASE, BLOOD: Lipase: 29 U/L (ref 11–51)

## 2021-09-10 MED ORDER — NITROGLYCERIN 0.4 MG SL SUBL
0.4000 mg | SUBLINGUAL_TABLET | SUBLINGUAL | Status: DC | PRN
  Administered 2021-09-10 (×3): 0.4 mg via SUBLINGUAL
  Filled 2021-09-10 (×3): qty 1

## 2021-09-10 MED ORDER — ALUM & MAG HYDROXIDE-SIMETH 200-200-20 MG/5ML PO SUSP
30.0000 mL | Freq: Once | ORAL | Status: AC
  Administered 2021-09-10: 30 mL via ORAL
  Filled 2021-09-10: qty 30

## 2021-09-10 MED ORDER — LIDOCAINE VISCOUS HCL 2 % MT SOLN
15.0000 mL | Freq: Once | OROMUCOSAL | Status: AC
  Administered 2021-09-10: 15 mL via ORAL
  Filled 2021-09-10: qty 15

## 2021-09-10 MED ORDER — METOPROLOL TARTRATE 5 MG/5ML IV SOLN
5.0000 mg | Freq: Once | INTRAVENOUS | Status: AC
  Administered 2021-09-10: 5 mg via INTRAVENOUS
  Filled 2021-09-10: qty 5

## 2021-09-10 MED ORDER — MORPHINE SULFATE (PF) 4 MG/ML IV SOLN
4.0000 mg | Freq: Once | INTRAVENOUS | Status: AC
  Administered 2021-09-10: 4 mg via INTRAVENOUS
  Filled 2021-09-10: qty 1

## 2021-09-10 MED ORDER — ONDANSETRON HCL 4 MG/2ML IJ SOLN
4.0000 mg | Freq: Once | INTRAMUSCULAR | Status: AC
  Administered 2021-09-10: 4 mg via INTRAVENOUS
  Filled 2021-09-10: qty 2

## 2021-09-10 NOTE — ED Triage Notes (Signed)
FIRST NURSE NOTE:  Syncopal at work, arrived via [facility] 10/10 CP rad to L arm, hx of MI 2018, 2021 with stents x 2  230/120 209/118 after NTG, p-111 99% RA CBG 111  18G R AC 324 ASA and 1 NTG spray no change.

## 2021-09-10 NOTE — Discharge Instructions (Addendum)
Please seek medical attention for any high fevers, chest pain, shortness of breath, change in behavior, persistent vomiting, bloody stool or any other new or concerning symptoms.  Please take your previously prescribed blood pressure medicine.

## 2021-09-10 NOTE — ED Triage Notes (Signed)
Pt states has been feeling "funky" all day, states sudden onset CP and upper abd pain while at work, states "next thing I know I was on the floor". Pt states +syncopal episode x 2-3 seconds. Pt states hx of MI, states hx of stent placement in 2018.

## 2021-09-10 NOTE — ED Provider Notes (Signed)
[facility] Emergency Department Provider Note   ____________________________________________   I have reviewed the triage vital signs and the nursing notes.   HISTORY  Chief Complaint Loss of Consciousness and Chest Pain   History limited by: Not Limited   HPI Brenda Todd is a 40 y.o. female who presents to the emergency department today because of concerns for chest pain and a syncopal episode.  Patient states she had been feeling off throughout the day.  She was then at work.  She started having chest pain and pain in her upper abdomen.  She does think she then passed out.  However she only thinks she was out for a couple of seconds.  She was given aspirin and nitroglycerin without any significant change.  Patient does state that she has a history of heart attack and had a stent placed back in 2018.  Patient denies any similar symptoms recently. She does state that she has not taken her blood pressure in roughly 1 week due to running out and waiting for refill.   Records reviewed. Per medical record review patient has a history of MI, CAD.   Past Medical History:  Diagnosis Date   Chronic hypertension    Kidney calculi    MI (myocardial infarction) ([place])    UTI (lower urinary tract infection)     Patient Active Problem List   Diagnosis Date Noted   Chest pain 04/28/2021   Epigastric pain 04/28/2021   Dysuria 04/28/2021   Leukocytosis 04/28/2021   Ovarian mass    NSTEMI (non-ST elevated myocardial infarction) ([place]) 08/31/2020   Problem with medical care compliance 01/25/2020   Hypokalemia 01/25/2020   Hypertensive urgency 45/80/9983   Renal colic 38/25/0539   History of MI (myocardial infarction) 01/24/2020   Ischemic chest pain ([place]) 01/24/2020   Postpartum care following cesarean delivery 12/17/2015   Elevated blood pressure affecting pregnancy in third trimester, antepartum 12/08/2015   Preeclampsia, severe 12/08/2015   Nephrolithiasis  10/12/2015   Supervision of high-risk pregnancy 10/11/2015   Labor and delivery, indication for care 10/11/2015   Hypertension    Cramping affecting pregnancy, antepartum 09/18/2015   Elevated BP 09/02/2015   Pyelonephritis affecting pregnancy in second trimester, antepartum 08/08/2015   Kidney infection 08/06/2015   Pyelonephritis affecting pregnancy in second trimester 07/13/2015   History of stillbirth 07/10/2015    Past Surgical History:  Procedure Laterality Date   APPENDECTOMY     l/s. ruptured   CARDIAC SURGERY     CESAREAN SECTION N/A 12/17/2015   Procedure: CESAREAN SECTION;  Surgeon: Honor Loh Ward, MD;  Location: [facility] ORS;  Service: Obstetrics;  Laterality: N/A;   LEFT HEART CATH AND CORONARY ANGIOGRAPHY N/A 09/02/2020   Procedure: LEFT HEART CATH AND CORONARY ANGIOGRAPHY;  Surgeon: Nelva Bush, MD;  Location: [facility];  Service: Cardiovascular;  Laterality: N/A;   LITHOTRIPSY      Prior to Admission medications   Medication Sig Start Date End Date Taking? Authorizing Provider  amLODipine (NORVASC) 10 MG tablet Take 1 tablet (10 mg total) by mouth daily. 04/30/21   Wouk, Ailene Rud, MD  aspirin 81 MG chewable tablet Chew 1 tablet (81 mg total) by mouth daily. 04/29/21   Wouk, Ailene Rud, MD  atorvastatin (LIPITOR) 80 MG tablet Take 1 tablet (80 mg total) by mouth daily. 04/29/21 05/29/21  Wouk, Ailene Rud, MD  isosorbide mononitrate (IMDUR) 30 MG 24 hr tablet Take 1 tablet (30 mg total) by mouth daily. 04/29/21 05/29/21  Wouk, Ailene Rud, MD  metoprolol tartrate (LOPRESSOR) 25 MG tablet Take 1 tablet (25 mg total) by mouth 2 (two) times daily. 04/29/21   Wouk, Ailene Rud, MD    Allergies No healthtouch food allergies and Penicillins  History reviewed. No pertinent family history.  Social History Social History   Tobacco Use   Smoking status: Every Day    Packs/day: 0.25    Types: Cigarettes   Smokeless tobacco: Never  Vaping Use   Vaping  Use: Never used  Substance Use Topics   Alcohol use: No    Alcohol/week: 0.0 standard drinks   Drug use: Yes    Types: Marijuana    Review of Systems Constitutional: No fever/chills Eyes: No visual changes. ENT: No sore throat. Cardiovascular: Positive for chest pain. Respiratory: Denies shortness of breath. Gastrointestinal: Positive for epigastric pain. Genitourinary: Negative for dysuria. Musculoskeletal: Negative for back pain. Skin: Negative for rash. Neurological: Positive for syncopal episode.   ____________________________________________   PHYSICAL EXAM:  VITAL SIGNS: ED Triage Vitals  Enc Vitals Group     BP 09/10/21 2132 (!) 209/118     Pulse Rate 09/10/21 2132 (!) 118     Resp 09/10/21 2132 16     Temp 09/10/21 2132 98.7 F (37.1 C)     Temp Source 09/10/21 2132 Oral     SpO2 09/10/21 2132 100 %     Weight 09/10/21 2138 167 lb (75.8 kg)     Height 09/10/21 2138 5\' 3"  (1.6 m)     Head Circumference --      Peak Flow --      Pain Score 09/10/21 2138 10   Constitutional: Alert and oriented.  Eyes: Conjunctivae are normal.  ENT      Head: Normocephalic and atraumatic.      Nose: No congestion/rhinnorhea.      Mouth/Throat: Mucous membranes are moist.      Neck: No stridor. Hematological/Lymphatic/Immunilogical: No cervical lymphadenopathy. Cardiovascular: Tachycardic, regular rhythm.  No murmurs, rubs, or gallops.  Respiratory: Normal respiratory effort without tachypnea nor retractions. Breath sounds are clear and equal bilaterally. No wheezes/rales/rhonchi. Gastrointestinal: Soft and tender to palpation in the right upper quadrant.  Genitourinary: Deferred Musculoskeletal: Normal range of motion in all extremities. No lower extremity edema. Neurologic:  Normal speech and language. No gross focal neurologic deficits are appreciated.  Skin:  Skin is warm, dry and intact. No rash noted. Psychiatric: Mood and affect are normal. Speech and behavior are  normal. Patient exhibits appropriate insight and judgment.  ____________________________________________    LABS (pertinent positives/negatives)  CBC wbc 12.9, hgb 14.0, plt 271 BMP wnl Trop hs 8 Lipase 29 Hepatic function panel wnl ____________________________________________   EKG  I, Nance Pear, attending physician, personally viewed and interpreted this EKG  EKG Time: 2135 Rate: 111 Rhythm: sinus tachycardia Axis: normal Intervals: qtc 448 QRS: LVH ST changes: st elevation v1, st depression v6 Impression: abnormal ekg  ____________________________________________    RADIOLOGY  [place] RUQ Normal RUQ [place]  CXR No active cardiopulmonary disease  ____________________________________________   PROCEDURES  Procedures  ____________________________________________   INITIAL IMPRESSION / ASSESSMENT AND PLAN / ED COURSE  Pertinent labs & imaging results that were available during my care of the patient were reviewed by me and considered in my medical decision making (see chart for details).  Patient with history of MI with stent placement in the past who presents to the emergency department today because of concerns for chest pain.  Patient also having some  pain in the epigastric right upper quadrant and is tender in the right upper quadrant.  Initial troponin was negative.  Did obtain a right upper quadrant ultrasound to evaluate for possible gallbladder disease however this was negative as well.  Will plan on checking a second troponin.  Will try GI cocktail.  Patient's blood pressure was significantly elevated here on initial presentation and this is likely due to her not taking her blood pressure medication.  She was given some IV metoprolol.  Per chart review she had a presentation and admission over the summer of this year for somewhat similar symptoms.  Work-up at that time was reassuring for any cardiac  etiology.  ____________________________________________   FINAL CLINICAL IMPRESSION(S) / ED DIAGNOSES  Final diagnoses:  RUQ abdominal pain  Chest pain, unspecified type     Note: This dictation was prepared with Dragon dictation. Any transcriptional errors that result from this process are unintentional     Nance Pear, MD 09/10/21 2357

## 2021-09-11 LAB — TROPONIN I (HIGH SENSITIVITY): Troponin I (High Sensitivity): 9 ng/L (ref <18)

## 2021-09-11 MED ORDER — METOPROLOL TARTRATE 25 MG PO TABS
25.0000 mg | ORAL_TABLET | Freq: Once | ORAL | Status: AC
  Administered 2021-09-11: 25 mg via ORAL
  Filled 2021-09-11: qty 1

## 2021-09-11 MED ORDER — AMLODIPINE BESYLATE 5 MG PO TABS
10.0000 mg | ORAL_TABLET | Freq: Once | ORAL | Status: AC
  Administered 2021-09-11: 10 mg via ORAL
  Filled 2021-09-11: qty 2

## 2021-09-11 NOTE — ED Provider Notes (Signed)
-----------------------------------------   12:03 AM on 09/11/2021 -----------------------------------------  Assuming care from Dr. Archie Balboa.  In short, Brenda Todd is a 40 y.o. female with a chief complaint of chest pain.  Refer to the original H&P for additional details.  The current plan of care is to follow up on repeat troponin.  Patient has had a very similar presentation in the past for which she was admitted and who had a reassuring cardiology work-up at the time.  She has been noncompliant with her blood pressure medicine.  I ordered amlodipine 10 mg and metoprolol tartrate 25 mg by mouth in addition to the metoprolol 5 mg IV she received previously.   ----------------------------------------- 1:07 AM on 09/11/2021 -----------------------------------------  Patient's second troponin is normal.  I have been busy with multiple active and ill patients in the emergency department, but the patient informed her nurse that if her troponin is normal "then I'm leaving," so I printed out her discharge instructions.  She has recommendations for following up as an outpatient, taking her medications, etc.  She does not appear to be in any distress and her blood pressure has improved after the medications.  She knows to return to the ED with new or worsening symptoms.   Hinda Kehr, MD 09/11/21 [phone]

## 2022-01-05 ENCOUNTER — Ambulatory Visit
Admission: EM | Admit: 2022-01-05 | Discharge: 2022-01-05 | Disposition: A | Discharge status: Home / Self Care | Payer: Medicaid - Out of State | Attending: Student | Admitting: Student

## 2022-01-05 ENCOUNTER — Other Ambulatory Visit: Payer: Self-pay

## 2022-01-05 ENCOUNTER — Encounter: Payer: Self-pay | Admitting: Emergency Medicine

## 2022-01-05 DIAGNOSIS — I160 Hypertensive urgency: Secondary | ICD-10-CM

## 2022-01-05 DIAGNOSIS — Z760 Encounter for issue of repeat prescription: Secondary | ICD-10-CM

## 2022-01-05 DIAGNOSIS — H66002 Acute suppurative otitis media without spontaneous rupture of ear drum, left ear: Secondary | ICD-10-CM

## 2022-01-05 DIAGNOSIS — Z880 Allergy status to penicillin: Secondary | ICD-10-CM

## 2022-01-05 MED ORDER — AMLODIPINE BESYLATE 10 MG PO TABS: 10.0000 mg | ORAL_TABLET | Freq: Every day | ORAL | 1 refills | Status: DC

## 2022-01-05 MED ORDER — DOXYCYCLINE HYCLATE 100 MG PO CAPS: 100.0000 mg | ORAL_CAPSULE | Freq: Two times a day (BID) | ORAL | 0 refills | Status: AC

## 2022-01-05 NOTE — Discharge Instructions (Signed)
-  Doxycycline twice daily for 7 days.  Make sure to wear sunscreen while spending time outside while on this medication as it can increase your chance of sunburn. You can take this medication with food if you have a sensitive stomach. -Resume the Amlodipine once daily  -Please check your blood pressure at home or at the pharmacy. If this continues to be >140/90, follow-up with your primary care provider for further blood pressure management/ medication titration. If you develop chest pain, shortness of breath, vision changes, the worst headache of your life- head straight to the ED or call 911.

## 2022-01-05 NOTE — ED Provider Notes (Signed)
Roderic Palau    CSN: [id number] Arrival date & time: 01/05/22  1417      History   Chief Complaint Chief Complaint  Patient presents with   Nasal Congestion   Otalgia   Sinus Pressure    HPI Brenda Todd is a 41 y.o. female presenting with concern for sinusitis and L Aom folllowing virus. Medical history MI, hypertension. Describes L ear pain, nasal congestion, sinus pressure x3 days following virus. Muffled hearing. Denies dizziness, tinnitus. Out of BP medications x2 weeks due to cost.  HPI  Past Medical History:  Diagnosis Date   Chronic hypertension    Kidney calculi    MI (myocardial infarction) ([place])    UTI (lower urinary tract infection)     Patient Active Problem List   Diagnosis Date Noted   Chest pain 04/28/2021   Epigastric pain 04/28/2021   Dysuria 04/28/2021   Leukocytosis 04/28/2021   Ovarian mass    NSTEMI (non-ST elevated myocardial infarction) ([place]) 08/31/2020   Problem with medical care compliance 01/25/2020   Hypokalemia 01/25/2020   Hypertensive urgency 63/78/5885   Renal colic 02/77/4128   History of MI (myocardial infarction) 01/24/2020   Ischemic chest pain ([place]) 01/24/2020   Postpartum care following cesarean delivery 12/17/2015   Elevated blood pressure affecting pregnancy in third trimester, antepartum 12/08/2015   Preeclampsia, severe 12/08/2015   Nephrolithiasis 10/12/2015   Supervision of high-risk pregnancy 10/11/2015   Labor and delivery, indication for care 10/11/2015   Hypertension    Cramping affecting pregnancy, antepartum 09/18/2015   Elevated BP 09/02/2015   Pyelonephritis affecting pregnancy in second trimester, antepartum 08/08/2015   Kidney infection 08/06/2015   Pyelonephritis affecting pregnancy in second trimester 07/13/2015   History of stillbirth 07/10/2015    Past Surgical History:  Procedure Laterality Date   APPENDECTOMY     l/s. ruptured   CARDIAC SURGERY     CESAREAN SECTION N/A 12/17/2015    Procedure: CESAREAN SECTION;  Surgeon: Honor Loh Ward, MD;  Location: [facility] ORS;  Service: Obstetrics;  Laterality: N/A;   LEFT HEART CATH AND CORONARY ANGIOGRAPHY N/A 09/02/2020   Procedure: LEFT HEART CATH AND CORONARY ANGIOGRAPHY;  Surgeon: Nelva Bush, MD;  Location: [facility];  Service: Cardiovascular;  Laterality: N/A;   LITHOTRIPSY      OB History     Gravida  2   Para  3   Term  0   Preterm  2   AB      Living  1      SAB      IAB      Ectopic      Multiple  0   Live Births  2            Home Medications    Prior to Admission medications   Medication Sig Start Date End Date Taking? Authorizing Provider  doxycycline (VIBRAMYCIN) 100 MG capsule Take 1 capsule (100 mg total) by mouth 2 (two) times daily for 7 days. 01/05/22 01/12/22 Yes Hazel Sams, PA-C  amLODipine (NORVASC) 10 MG tablet Take 1 tablet (10 mg total) by mouth daily. 01/05/22   Hazel Sams, PA-C  aspirin 81 MG chewable tablet Chew 1 tablet (81 mg total) by mouth daily. 04/29/21   Wouk, Ailene Rud, MD  atorvastatin (LIPITOR) 80 MG tablet Take 1 tablet (80 mg total) by mouth daily. 04/29/21 05/29/21  Wouk, Ailene Rud, MD  isosorbide mononitrate (IMDUR) 30 MG 24 hr tablet Take 1  tablet (30 mg total) by mouth daily. 04/29/21 05/29/21  Wouk, Ailene Rud, MD  metoprolol tartrate (LOPRESSOR) 25 MG tablet Take 1 tablet (25 mg total) by mouth 2 (two) times daily. 04/29/21   Wouk, Ailene Rud, MD    Family History History reviewed. No pertinent family history.  Social History Social History   Tobacco Use   Smoking status: Every Day    Packs/day: 0.25    Types: Cigarettes   Smokeless tobacco: Never  Vaping Use   Vaping Use: Never used  Substance Use Topics   Alcohol use: No    Alcohol/week: 0.0 standard drinks   Drug use: Yes    Types: Marijuana     Allergies   No healthtouch food allergies and Penicillins   Review of Systems Review of Systems  Constitutional:   Negative for appetite change, chills and fever.  HENT:  Positive for congestion, ear pain and sinus pressure. Negative for rhinorrhea, sinus pain and sore throat.   Eyes:  Negative for redness and visual disturbance.  Respiratory:  Negative for cough, chest tightness, shortness of breath and wheezing.   Cardiovascular:  Negative for chest pain and palpitations.  Gastrointestinal:  Negative for abdominal pain, constipation, diarrhea, nausea and vomiting.  Genitourinary:  Negative for dysuria, frequency and urgency.  Musculoskeletal:  Negative for myalgias.  Neurological:  Negative for dizziness, weakness and headaches.  Psychiatric/Behavioral:  Negative for confusion.   All other systems reviewed and are negative.   Physical Exam Triage Vital Signs ED Triage Vitals  Enc Vitals Group     BP      Pulse      Resp      Temp      Temp src      SpO2      Weight      Height      Head Circumference      Peak Flow      Pain Score      Pain Loc      Pain Edu?      Excl. in [place]?    No data found.  Updated Vital Signs BP (!) 186/125 (BP Location: Left Arm)    Pulse 96    Temp 98.4 F (36.9 C) (Oral)    Resp 20    LMP 12/27/2021    SpO2 98%   Visual Acuity Right Eye Distance:   Left Eye Distance:   Bilateral Distance:    Right Eye Near:   Left Eye Near:    Bilateral Near:     Physical Exam Vitals reviewed.  Constitutional:      Appearance: Normal appearance. She is not ill-appearing.  HENT:     Head: Normocephalic and atraumatic.     Right Ear: Hearing, ear canal and external ear normal. No swelling or tenderness. No middle ear effusion. There is no impacted cerumen. No mastoid tenderness. Tympanic membrane is erythematous and bulging. Tympanic membrane is not injected, scarred, perforated or retracted.     Left Ear: Hearing, ear canal and external ear normal. Tenderness present. No swelling.  No middle ear effusion. There is no impacted cerumen. No mastoid tenderness. Tympanic  membrane is erythematous and bulging. Tympanic membrane is not injected, scarred, perforated or retracted.     Mouth/Throat:     Pharynx: Oropharynx is clear. No oropharyngeal exudate or posterior oropharyngeal erythema.  Cardiovascular:     Rate and Rhythm: Normal rate and regular rhythm.     Heart sounds: Normal heart sounds.  Pulmonary:     Effort: Pulmonary effort is normal.     Breath sounds: Normal breath sounds.  Lymphadenopathy:     Cervical: Cervical adenopathy present.     Right cervical: No superficial, deep or posterior cervical adenopathy.    Left cervical: Superficial cervical adenopathy present. No deep or posterior cervical adenopathy.  Neurological:     General: No focal deficit present.     Mental Status: She is alert and oriented to person, place, and time.     Comments: PERRLA, EOMI  Psychiatric:        Mood and Affect: Mood normal.        Behavior: Behavior normal.        Thought Content: Thought content normal.        Judgment: Judgment normal.     UC Treatments / Results  Labs (all labs ordered are listed, but only abnormal results are displayed) Labs Reviewed - No data to display  EKG   Radiology No results found.  Procedures Procedures (including critical care time)  Medications Ordered in UC Medications - No data to display  Initial Impression / Assessment and Plan / UC Course  I have reviewed the triage vital signs and the nursing notes.  Pertinent labs & imaging results that were available during my care of the patient were reviewed by me and considered in my medical decision making (see chart for details).     This patient is a very pleasant 41 y.o. year old female presenting with L AOM following viral syndrome. Afebrile, nontachy. LMP 12/27/21, States she is not pregnant or breastfeeding.  Penicillin allergic. Doxycycline as below.   Out of the amlodipine x2 weeks. BP 186/125 today. Denies headaches, SOB, CP, dizziness, weakness,  vision changes. Refilled as below.   ED return precautions discussed. Patient verbalizes understanding and agreement.   level 4 for unstable chronic illness and prescription drug management.   Final Clinical Impressions(s) / UC Diagnoses   Final diagnoses:  Non-recurrent acute suppurative otitis media of left ear without spontaneous rupture of tympanic membrane  Hypertensive urgency  Penicillin allergy  Medication refill     Discharge Instructions      -Doxycycline twice daily for 7 days.  Make sure to wear sunscreen while spending time outside while on this medication as it can increase your chance of sunburn. You can take this medication with food if you have a sensitive stomach. -Resume the Amlodipine once daily  -Please check your blood pressure at home or at the pharmacy. If this continues to be >140/90, follow-up with your primary care provider for further blood pressure management/ medication titration. If you develop chest pain, shortness of breath, vision changes, the worst headache of your life- head straight to the ED or call 911.      ED Prescriptions     Medication Sig Dispense Auth. Provider   doxycycline (VIBRAMYCIN) 100 MG capsule Take 1 capsule (100 mg total) by mouth 2 (two) times daily for 7 days. 14 capsule Hazel Sams, PA-C   amLODipine (NORVASC) 10 MG tablet Take 1 tablet (10 mg total) by mouth daily. 30 tablet Hazel Sams, PA-C      PDMP not reviewed this encounter.   Hazel Sams, PA-C 01/05/22 1459

## 2022-01-05 NOTE — ED Triage Notes (Signed)
Pt c/o left ear pain, nasal congestion, and sinus pressure/pain x 3 days

## 2022-02-05 ENCOUNTER — Encounter: Payer: Self-pay | Admitting: Emergency Medicine

## 2022-02-05 ENCOUNTER — Ambulatory Visit
Admission: EM | Admit: 2022-02-05 | Discharge: 2022-02-05 | Disposition: A | Discharge status: Home / Self Care | Payer: Self-pay | Attending: Urgent Care | Admitting: Urgent Care

## 2022-02-05 DIAGNOSIS — J0141 Acute recurrent pansinusitis: Secondary | ICD-10-CM

## 2022-02-05 DIAGNOSIS — H65116 Acute and subacute allergic otitis media (mucoid) (sanguinous) (serous), recurrent, bilateral: Secondary | ICD-10-CM

## 2022-02-05 MED ORDER — LEVOFLOXACIN 500 MG PO TABS: 500.0000 mg | ORAL_TABLET | Freq: Every day | ORAL | 0 refills | Status: AC

## 2022-02-05 MED ORDER — LEVOCETIRIZINE DIHYDROCHLORIDE 5 MG PO TABS: 5.0000 mg | ORAL_TABLET | Freq: Every evening | ORAL | 0 refills | Status: DC

## 2022-02-05 MED ORDER — PREDNISONE 10 MG (21) PO TBPK: ORAL_TABLET | Freq: Every day | ORAL | 0 refills | Status: DC

## 2022-02-05 MED ORDER — BUDESONIDE 32 MCG/ACT NA SUSP: 1.0000 | Freq: Every day | NASAL | 0 refills | Status: DC

## 2022-02-05 NOTE — ED Triage Notes (Signed)
Pt here with continuing sinus pressure, congestion and bilateral ear fullness and pressure x 1 month.  ?

## 2022-02-05 NOTE — ED Provider Notes (Signed)
Roderic Palau    CSN: [id number] Arrival date & time: 02/05/22  1324      History   Chief Complaint Chief Complaint  Patient presents with   Ear Fullness   Nasal Congestion    HPI ELLANORE VANHOOK is a 41 y.o. female.   Pleasant 41yo female presents today with a 1+ month hx of ear pain, sinus pain and nasal congestion. She was seen the end of January and prescribed doxycycline x1 week for sinusitis. She took the abx as prescribed and states she felt better for about 5 days. She states her sx came back with a vengeance and is having severe nasal congestion, ear pressure and feels like her head is in a vice. She states her hearing is muffled and "clanging." She has been taking numerous OTC medications without relief. She does have a dry cough but feels it is related to her post nasal drip.   The history is provided by the patient.  Ear Fullness This is a recurrent problem. The current episode started more than 1 week ago. The problem occurs constantly. The problem has been gradually worsening. Associated symptoms include headaches (sinus). Pertinent negatives include no chest pain, no abdominal pain and no shortness of breath. The symptoms are aggravated by bending and coughing. Nothing relieves the symptoms. She has tried acetaminophen for the symptoms.   Past Medical History:  Diagnosis Date   Chronic hypertension    Kidney calculi    MI (myocardial infarction) ([place])    UTI (lower urinary tract infection)     Patient Active Problem List   Diagnosis Date Noted   Chest pain 04/28/2021   Epigastric pain 04/28/2021   Dysuria 04/28/2021   Leukocytosis 04/28/2021   Ovarian mass    NSTEMI (non-ST elevated myocardial infarction) ([place]) 08/31/2020   Problem with medical care compliance 01/25/2020   Hypokalemia 01/25/2020   Hypertensive urgency 56/21/3086   Renal colic 57/84/6962   History of MI (myocardial infarction) 01/24/2020   Ischemic chest pain ([place]) 01/24/2020    Postpartum care following cesarean delivery 12/17/2015   Elevated blood pressure affecting pregnancy in third trimester, antepartum 12/08/2015   Preeclampsia, severe 12/08/2015   Nephrolithiasis 10/12/2015   Supervision of high-risk pregnancy 10/11/2015   Labor and delivery, indication for care 10/11/2015   Hypertension    Cramping affecting pregnancy, antepartum 09/18/2015   Elevated BP 09/02/2015   Pyelonephritis affecting pregnancy in second trimester, antepartum 08/08/2015   Kidney infection 08/06/2015   Pyelonephritis affecting pregnancy in second trimester 07/13/2015   History of stillbirth 07/10/2015    Past Surgical History:  Procedure Laterality Date   APPENDECTOMY     l/s. ruptured   CARDIAC SURGERY     CESAREAN SECTION N/A 12/17/2015   Procedure: CESAREAN SECTION;  Surgeon: Honor Loh Ward, MD;  Location: [facility];  Service: Obstetrics;  Laterality: N/A;   LEFT HEART CATH AND CORONARY ANGIOGRAPHY N/A 09/02/2020   Procedure: LEFT HEART CATH AND CORONARY ANGIOGRAPHY;  Surgeon: Nelva Bush, MD;  Location: [facility];  Service: Cardiovascular;  Laterality: N/A;   LITHOTRIPSY      OB History     Gravida  2   Para  3   Term  0   Preterm  2   AB      Living  1      SAB      IAB      Ectopic      Multiple  0   Live Births  2            Home Medications    Prior to Admission medications   Medication Sig Start Date End Date Taking? Authorizing Provider  budesonide (EQ BUDESONIDE NASAL) 32 MCG/ACT nasal spray Place 1 spray into both nostrils daily. 02/05/22  Yes Raha Tennison L, PA  levocetirizine (XYZAL) 5 MG tablet Take 1 tablet (5 mg total) by mouth every evening. 02/05/22  Yes Alisha Bacus L, PA  levofloxacin (LEVAQUIN) 500 MG tablet Take 1 tablet (500 mg total) by mouth daily for 7 days. 02/05/22 02/12/22 Yes Rashidi Loh L, PA  predniSONE (STERAPRED UNI-PAK 21 TAB) 10 MG (21) TBPK tablet Take by mouth daily. Take 6 tabs by mouth daily  for 2 days, then 5 tabs for 2 days, then 4 tabs for 2 days, then 3 tabs for 2 days, 2 tabs for 2 days, then 1 tab by mouth daily for 2 days. DO NOT START UNTIL AFTER COMPLETING THE ANTIBIOTIC! 02/05/22  Yes Lakeita Panther L, PA  amLODipine (NORVASC) 10 MG tablet Take 1 tablet (10 mg total) by mouth daily. 01/05/22   Hazel Sams, PA-C  aspirin 81 MG chewable tablet Chew 1 tablet (81 mg total) by mouth daily. 04/29/21   Wouk, Ailene Rud, MD  atorvastatin (LIPITOR) 80 MG tablet Take 1 tablet (80 mg total) by mouth daily. 04/29/21 05/29/21  Wouk, Ailene Rud, MD  isosorbide mononitrate (IMDUR) 30 MG 24 hr tablet Take 1 tablet (30 mg total) by mouth daily. 04/29/21 05/29/21  Wouk, Ailene Rud, MD  metoprolol tartrate (LOPRESSOR) 25 MG tablet Take 1 tablet (25 mg total) by mouth 2 (two) times daily. 04/29/21   Wouk, Ailene Rud, MD    Family History History reviewed. No pertinent family history.  Social History Social History   Tobacco Use   Smoking status: Every Day    Packs/day: 0.25    Types: Cigarettes   Smokeless tobacco: Never  Vaping Use   Vaping Use: Never used  Substance Use Topics   Alcohol use: No    Alcohol/week: 0.0 standard drinks   Drug use: Yes    Types: Marijuana     Allergies   No healthtouch food allergies and Penicillins   Review of Systems Review of Systems  HENT:  Positive for congestion, ear pain, postnasal drip, sinus pressure and sinus pain.   Respiratory:  Positive for cough. Negative for shortness of breath.   Cardiovascular:  Negative for chest pain.  Gastrointestinal:  Negative for abdominal pain.  Neurological:  Positive for headaches (sinus).  All other systems reviewed and are negative.   Physical Exam Triage Vital Signs ED Triage Vitals  Enc Vitals Group     BP 02/05/22 1357 (!) 142/86     Pulse Rate 02/05/22 1357 78     Resp 02/05/22 1357 20     Temp 02/05/22 1357 98.8 F (37.1 C)     Temp src --      SpO2 02/05/22 1357 98 %     Weight  --      Height --      Head Circumference --      Peak Flow --      Pain Score 02/05/22 1359 5     Pain Loc --      Pain Edu? --      Excl. in [place]? --    No data found.  Updated Vital Signs BP (!) 142/86    Pulse 78    Temp  98.8 F (37.1 C)    Resp 20    SpO2 98%   Visual Acuity Right Eye Distance:   Left Eye Distance:   Bilateral Distance:    Right Eye Near:   Left Eye Near:    Bilateral Near:     Physical Exam Vitals and nursing note reviewed.  Constitutional:      General: She is not in acute distress.    Appearance: Normal appearance. She is well-developed. She is ill-appearing. She is not toxic-appearing or diaphoretic.     Comments: Very nasal/ congested quality voice  HENT:     Head: Normocephalic and atraumatic.     Right Ear: Ear canal and external ear normal. There is no impacted cerumen.     Left Ear: Ear canal and external ear normal. There is no impacted cerumen.     Ears:     Comments: Bilateral Tms cloudy with thick amber fluid posterior TM. Light reflex present but loss of landmarks noted    Nose: Congestion and rhinorrhea present.     Right Turbinates: Enlarged and swollen.     Left Turbinates: Enlarged and swollen.     Right Sinus: Maxillary sinus tenderness and frontal sinus tenderness present.     Left Sinus: Maxillary sinus tenderness and frontal sinus tenderness present.     Mouth/Throat:     Mouth: Mucous membranes are moist.     Pharynx: Oropharynx is clear. No oropharyngeal exudate or posterior oropharyngeal erythema.  Eyes:     General: No scleral icterus.       Right eye: No discharge.        Left eye: No discharge.     Extraocular Movements: Extraocular movements intact.     Conjunctiva/sclera: Conjunctivae normal.     Pupils: Pupils are equal, round, and reactive to light.  Cardiovascular:     Rate and Rhythm: Normal rate and regular rhythm.     Pulses: Normal pulses.     Heart sounds: Normal heart sounds. No murmur heard. Pulmonary:      Effort: Pulmonary effort is normal. No respiratory distress.     Breath sounds: Normal breath sounds. No stridor. No wheezing or rales.  Chest:     Chest wall: No tenderness.  Abdominal:     Palpations: Abdomen is soft.     Tenderness: There is no abdominal tenderness.  Musculoskeletal:        General: No swelling.     Cervical back: Normal range of motion and neck supple. No rigidity or tenderness.  Lymphadenopathy:     Cervical: Cervical adenopathy (anterior cervical chain bilaterally) present.  Skin:    General: Skin is warm and dry.     Capillary Refill: Capillary refill takes less than 2 seconds.  Neurological:     General: No focal deficit present.     Mental Status: She is alert and oriented to person, place, and time.  Psychiatric:        Mood and Affect: Mood normal.     UC Treatments / Results  Labs (all labs ordered are listed, but only abnormal results are displayed) Labs Reviewed - No data to display  EKG   Radiology No results found.  Procedures Procedures (including critical care time)  Medications Ordered in UC Medications - No data to display  Initial Impression / Assessment and Plan / UC Course  I have reviewed the triage vital signs and the nursing notes.  Pertinent labs & imaging results that were available during my  care of the patient were reviewed by me and considered in my medical decision making (see chart for details).     Recurrent sinusitis - pt already completed one week of doxycycline. She has anaphylactic reactions to PCNs and cephs, therefore will have to do fluoroquinolone. Side effect profile discussed. Humidification, saline rinses, abx. May also need to try OTC antihistamine. Allergic mucoid OM - due to degree of nasal congestion and swelling, pt will likely need PO and nasal steroids, however understands concurrent administration of fluoroquinolones and steroids is contraindicated. Pt will do abx first, followed by steroid  pack.   Final Clinical Impressions(s) / UC Diagnoses   Final diagnoses:  Acute recurrent pansinusitis  Acute and subacute allergic otitis media (mucoid) (sanguinous) (serous), recurrent, bilateral     Discharge Instructions      Purchase a warm mist vaporizer for use at nighttime, continue using steam from shower. Please start using the nasal spray prescribed today, 1-2 times daily per nostril.  Stop use if nasal bleeding occurs Start taking the prednisone as prescribed, best taken in the morning to prevent insomnia at night. DO NOT TAKE AT THE SAME TIME AS THE ANTIBIOTIC - antibiotics must be completely prior to initiating steroid therapy. Take xyzal at night as this can make you sleepy. Start taking levaquin once daily x 1 week. Please increase your water intake. Avoid excessive sunlight. If severe ankle/ heel pain occurs, stop the antibiotic immediately. Follow up with PCP should symptoms persist    ED Prescriptions     Medication Sig Dispense Auth. Provider   levofloxacin (LEVAQUIN) 500 MG tablet Take 1 tablet (500 mg total) by mouth daily for 7 days. 7 tablet Trenda Corliss L, PA   predniSONE (STERAPRED UNI-PAK 21 TAB) 10 MG (21) TBPK tablet Take by mouth daily. Take 6 tabs by mouth daily for 2 days, then 5 tabs for 2 days, then 4 tabs for 2 days, then 3 tabs for 2 days, 2 tabs for 2 days, then 1 tab by mouth daily for 2 days. DO NOT START UNTIL AFTER COMPLETING THE ANTIBIOTIC! 42 tablet Cadie Sorci L, PA   budesonide (EQ BUDESONIDE NASAL) 32 MCG/ACT nasal spray Place 1 spray into both nostrils daily. 8.43 mL Amijah Timothy L, PA   levocetirizine (XYZAL) 5 MG tablet Take 1 tablet (5 mg total) by mouth every evening. 30 tablet Lindsay Straka L, [place]      PDMP not reviewed this encounter.   Chaney Malling, [place] 02/05/22 1531

## 2022-02-05 NOTE — Discharge Instructions (Addendum)
Purchase a warm mist vaporizer for use at nighttime, continue using steam from shower. ?Please start using the nasal spray prescribed today, 1-2 times daily per nostril.  Stop use if nasal bleeding occurs ?Start taking the prednisone as prescribed, best taken in the morning to prevent insomnia at night. DO NOT TAKE AT THE SAME TIME AS THE ANTIBIOTIC - antibiotics must be completely prior to initiating steroid therapy. ?Take xyzal at night as this can make you sleepy. ?Start taking levaquin once daily x 1 week. Please increase your water intake. Avoid excessive sunlight. If severe ankle/ heel pain occurs, stop the antibiotic immediately. ?Follow up with PCP should symptoms persist ?

## 2022-05-18 ENCOUNTER — Observation Stay
Admission: EM | Admit: 2022-05-18 | Discharge: 2022-05-19 | Disposition: A | Discharge status: Home / Self Care | Payer: Self-pay | Attending: Internal Medicine | Admitting: Internal Medicine

## 2022-05-18 ENCOUNTER — Emergency Department: Payer: Self-pay

## 2022-05-18 ENCOUNTER — Encounter: Payer: Self-pay | Admitting: Emergency Medicine

## 2022-05-18 DIAGNOSIS — R079 Chest pain, unspecified: Secondary | ICD-10-CM | POA: Diagnosis present

## 2022-05-18 DIAGNOSIS — Z6830 Body mass index (BMI) 30.0-30.9, adult: Secondary | ICD-10-CM | POA: Insufficient documentation

## 2022-05-18 DIAGNOSIS — Z955 Presence of coronary angioplasty implant and graft: Secondary | ICD-10-CM | POA: Insufficient documentation

## 2022-05-18 DIAGNOSIS — R072 Precordial pain: Principal | ICD-10-CM | POA: Insufficient documentation

## 2022-05-18 DIAGNOSIS — I2510 Atherosclerotic heart disease of native coronary artery without angina pectoris: Secondary | ICD-10-CM | POA: Insufficient documentation

## 2022-05-18 DIAGNOSIS — Z79899 Other long term (current) drug therapy: Secondary | ICD-10-CM | POA: Insufficient documentation

## 2022-05-18 DIAGNOSIS — E669 Obesity, unspecified: Secondary | ICD-10-CM | POA: Insufficient documentation

## 2022-05-18 DIAGNOSIS — F17210 Nicotine dependence, cigarettes, uncomplicated: Secondary | ICD-10-CM | POA: Insufficient documentation

## 2022-05-18 DIAGNOSIS — I10 Essential (primary) hypertension: Secondary | ICD-10-CM | POA: Insufficient documentation

## 2022-05-18 DIAGNOSIS — Z7982 Long term (current) use of aspirin: Secondary | ICD-10-CM | POA: Insufficient documentation

## 2022-05-18 DIAGNOSIS — R29818 Other symptoms and signs involving the nervous system: Secondary | ICD-10-CM | POA: Insufficient documentation

## 2022-05-18 DIAGNOSIS — I160 Hypertensive urgency: Secondary | ICD-10-CM | POA: Insufficient documentation

## 2022-05-18 DIAGNOSIS — K921 Melena: Secondary | ICD-10-CM | POA: Insufficient documentation

## 2022-05-18 DIAGNOSIS — I252 Old myocardial infarction: Secondary | ICD-10-CM

## 2022-05-18 LAB — BASIC METABOLIC PANEL
Anion gap: 9 (ref 5–15)
BUN: 12 mg/dL (ref 6–20)
CO2: 24 mmol/L (ref 22–32)
Calcium: 8.7 mg/dL — ABNORMAL LOW (ref 8.9–10.3)
Chloride: 104 mmol/L (ref 98–111)
Creatinine, Ser: 0.62 mg/dL (ref 0.44–1.00)
GFR, Estimated: >60 mL/min (ref >60)
Glucose, Bld: 95 mg/dL (ref 70–99)
Potassium: 3.1 mmol/L — ABNORMAL LOW (ref 3.5–5.1)
Sodium: 137 mmol/L (ref 135–145)

## 2022-05-18 LAB — CBC
HCT: 42.7 % (ref 36.0–46.0)
Hemoglobin: 13.9 g/dL (ref 12.0–15.0)
MCH: 26.3 pg (ref 26.0–34.0)
MCHC: 32.6 g/dL (ref 30.0–36.0)
MCV: 80.7 fL (ref 80.0–100.0)
Platelets: 270 10*3/uL (ref 150–400)
RBC: 5.29 MIL/uL — ABNORMAL HIGH (ref 3.87–5.11)
RDW: 14.3 % (ref 11.5–15.5)
WBC: 14.1 10*3/uL — ABNORMAL HIGH (ref 4.0–10.5)
nRBC: 0 % (ref 0.0–0.2)

## 2022-05-18 LAB — LIPASE, BLOOD: Lipase: 31 U/L (ref 11–51)

## 2022-05-18 LAB — HEPATIC FUNCTION PANEL
ALT: 18 U/L (ref 0–44)
AST: 16 U/L (ref 15–41)
Albumin: 3.9 g/dL (ref 3.5–5.0)
Alkaline Phosphatase: 47 U/L (ref 38–126)
Bilirubin, Direct: <0.1 mg/dL (ref 0.0–0.2)
Total Bilirubin: 0.6 mg/dL (ref 0.3–1.2)
Total Protein: 7 g/dL (ref 6.5–8.1)

## 2022-05-18 LAB — TROPONIN I (HIGH SENSITIVITY): Troponin I (High Sensitivity): 9 ng/L (ref <18)

## 2022-05-18 MED ORDER — MORPHINE SULFATE (PF) 4 MG/ML IV SOLN
4.0000 mg | Freq: Once | INTRAVENOUS | Status: AC
  Administered 2022-05-18: 4 mg via INTRAVENOUS
  Filled 2022-05-18: qty 1

## 2022-05-18 MED ORDER — ASPIRIN 81 MG PO CHEW
324.0000 mg | CHEWABLE_TABLET | Freq: Once | ORAL | Status: AC
  Administered 2022-05-19: 324 mg via ORAL
  Filled 2022-05-18: qty 4

## 2022-05-18 MED ORDER — IOHEXOL 350 MG/ML SOLN
75.0000 mL | Freq: Once | INTRAVENOUS | Status: AC | PRN
Start: 2022-05-18 — End: 2022-05-18
  Administered 2022-05-18: 75 mL via INTRAVENOUS

## 2022-05-18 MED ORDER — ONDANSETRON 4 MG PO TBDP
4.0000 mg | ORAL_TABLET | Freq: Once | ORAL | Status: AC
  Administered 2022-05-18: 4 mg via ORAL
  Filled 2022-05-18: qty 1

## 2022-05-18 MED ORDER — SODIUM CHLORIDE 0.9 % IV BOLUS
1000.0000 mL | Freq: Once | INTRAVENOUS | Status: AC
  Administered 2022-05-18: 1000 mL via INTRAVENOUS

## 2022-05-18 MED ORDER — ALUM & MAG HYDROXIDE-SIMETH 200-200-20 MG/5ML PO SUSP
30.0000 mL | Freq: Once | ORAL | Status: AC
  Administered 2022-05-18: 30 mL via ORAL
  Filled 2022-05-18: qty 30

## 2022-05-18 NOTE — ED Provider Notes (Signed)
[facility] Provider Note    Event Date/Time   First MD Initiated Contact with Patient 05/18/22 2114     (approximate)   History   Chest Pain   HPI  Brenda Todd is a 41 y.o. female   past medical history of NSTEMI CAD status post PCI x2 who presents with chest pain.  Symptoms started on 615.  She endorses sharp stabbing pain around the left breast radiating around to the left back.  Also has had nausea and some epigastric discomfort.  Today also noted a dark stool.  She feels mildly short of breath.  Patient also notes that over the last several days has had numbness in the left arm and some weakness.  Denies difficulty speaking or seeing or other numbness weakness.  Patient has history of MI says that she did feel similar with prior MI.  Does not have insurance has been off of all of her medication.     Past Medical History:  Diagnosis Date   Chronic hypertension    Kidney calculi    MI (myocardial infarction) ([place])    UTI (lower urinary tract infection)     Patient Active Problem List   Diagnosis Date Noted   Chest pain 04/28/2021   Epigastric pain 04/28/2021   Dysuria 04/28/2021   Leukocytosis 04/28/2021   Ovarian mass    NSTEMI (non-ST elevated myocardial infarction) ([place]) 08/31/2020   Problem with medical care compliance 01/25/2020   Hypokalemia 01/25/2020   Hypertensive urgency 26/71/2458   Renal colic 09/98/3382   History of MI (myocardial infarction) 01/24/2020   Ischemic chest pain ([place]) 01/24/2020   Postpartum care following cesarean delivery 12/17/2015   Elevated blood pressure affecting pregnancy in third trimester, antepartum 12/08/2015   Preeclampsia, severe 12/08/2015   Nephrolithiasis 10/12/2015   Supervision of high-risk pregnancy 10/11/2015   Labor and delivery, indication for care 10/11/2015   Hypertension    Cramping affecting pregnancy, antepartum 09/18/2015   Elevated BP 09/02/2015   Pyelonephritis affecting  pregnancy in second trimester, antepartum 08/08/2015   Kidney infection 08/06/2015   Pyelonephritis affecting pregnancy in second trimester 07/13/2015   History of stillbirth 07/10/2015     Physical Exam  Triage Vital Signs: ED Triage Vitals  Enc Vitals Group     BP 05/18/22 2058 (!) 220/114     Pulse Rate 05/18/22 2058 89     Resp 05/18/22 2058 (!) 21     Temp 05/18/22 2058 98.4 F (36.9 C)     Temp Source 05/18/22 2058 Oral     SpO2 05/18/22 2058 97 %     Weight 05/18/22 2057 174 lb (78.9 kg)     Height 05/18/22 2057 '5\' 3"'$  (1.6 m)     Head Circumference --      Peak Flow --      Pain Score --      Pain Loc --      Pain Edu? --      Excl. in [place]? --     Most recent vital signs: Vitals:   05/18/22 2200 05/18/22 2330  BP: (!) 174/115 (!) 205/122  Pulse: 83 82  Resp: (!) 21 17  Temp:    SpO2: 99% 98%     General: Awake, no distress.  CV:  Good peripheral perfusion.  Resp:  Normal effort.  No increased work of breathing Abd:  No distention.  Tender to palpation epigastric region Neuro:  Awake, Alert, Oriented x 3  Other:  Brown stool on rectal exam Aox3, nml speech  PERRL, EOMI, face symmetric, nml tongue movement  4-5 strength with grip and left upper extremity compared to right Normal sensation to light touch in bilateral upper extremities Finger-nose-finger intact BL    ED Results / Procedures / Treatments  Labs (all labs ordered are listed, but only abnormal results are displayed) Labs Reviewed  BASIC METABOLIC PANEL - Abnormal; Notable for the following components:      Result Value   Potassium 3.1 (*)    Calcium 8.7 (*)    All other components within normal limits  CBC - Abnormal; Notable for the following components:   WBC 14.1 (*)    RBC 5.29 (*)    All other components within normal limits  HEPATIC FUNCTION PANEL  LIPASE, BLOOD  POC URINE PREG, ED  TROPONIN I (HIGH SENSITIVITY)  TROPONIN I (HIGH SENSITIVITY)     EKG  EKG  interpretation performed by myself: NSR, nml axis, nml intervals, no acute ischemic changes      RADIOLOGY    PROCEDURES:  Critical Care performed: No  Procedures  The patient is on the cardiac monitor to evaluate for evidence of arrhythmia and/or significant heart rate changes.   MEDICATIONS ORDERED IN ED: Medications  aspirin chewable tablet 324 mg (has no administration in time range)  potassium chloride SA (KLOR-CON M) CR tablet 40 mEq (has no administration in time range)  labetalol (NORMODYNE) injection 10 mg (has no administration in time range)  alum & mag hydroxide-simeth (MAALOX/MYLANTA) 200-200-20 MG/5ML suspension 30 mL (30 mLs Oral Given 05/18/22 2319)  ondansetron (ZOFRAN-ODT) disintegrating tablet 4 mg (4 mg Oral Given 05/18/22 2319)  sodium chloride 0.9 % bolus 1,000 mL (1,000 mLs Intravenous New Bag/Given 05/18/22 2325)  morphine (PF) 4 MG/ML injection 4 mg (4 mg Intravenous Given 05/18/22 2320)  iohexol (OMNIPAQUE) 350 MG/ML injection 75 mL (75 mLs Intravenous Contrast Given 05/18/22 2300)     IMPRESSION / MDM / ASSESSMENT AND PLAN / ED COURSE  I reviewed the triage vital signs and the nursing notes.                              Patient's presentation is most consistent with acute presentation with potential threat to life or bodily function.  Differential diagnosis includes, but is not limited to, ACS, PE, aortic dissection,   The patient is a 41 year old female past medical history of CAD who presents with chest pain vomiting nausea and epigastric discomfort.  Her symptoms of chest pain started around 6 PM.  She endorses sharp left-sided chest pain radiating around to the back as well as nausea and mild dyspnea.  Has had left arm numbness and weakness for several days cannot say exactly when this started.  Patient is hypertensive but vitals otherwise within normal limits.  She appears well on exam she does have tenderness in the epigastric region but abdomen is  overall benign.  Neurologic exam is notable for decreased grip strength in the left hand but otherwise exam is unremarkable sensation is grossly intact.  EKG is nonischemic initial troponin is negative she is mildly hypokalemic.  Given the abdominal pain chest pain and neurologic symptoms will obtain a CTA to rule out dissection patient also with poorly controlled blood pressure so is at risk for this.  Will treat pain with morphine and GI cocktail and nausea with Zofran.  We will send repeat troponin.  Also obtain a CT head given the numbness although I suspect that this is more of a peripheral neuropathy given no other neurologic findings.   CTA is negative for dissection or other abnormality.  Patient still having ongoing chest pain after morphine GI cocktail.  Given her history elevated blood pressure and ongoing chest pain will admit to the hospital service.  FINAL CLINICAL IMPRESSION(S) / ED DIAGNOSES   Final diagnoses:  Chest pain, unspecified type     Rx / DC Orders   ED Discharge Orders     None        Note:  This document was prepared using Dragon voice recognition software and may include unintentional dictation errors.   Rada Hay, MD 05/19/22 [phone]

## 2022-05-18 NOTE — ED Triage Notes (Signed)
Pt arrived via [facility] from job where she had sudden onset of chest tightness, pressure, sharp intermittent substernal pain that radiates into left shoulder, jaw and posterior left shoulder. Pt with cardiac hx including 2 previous MI's with 2 cardiac stent placements, last MI in 2018. Pt unable to afford medication and has been off Brilinta and metoprolol over 1 year.   Elevated BP in triage 220/114

## 2022-05-18 NOTE — ED Triage Notes (Signed)
EMS brings pt in for c/o chest pain/pressure for several days accomp by black tarry stools & emesis; hx MI

## 2022-05-19 ENCOUNTER — Observation Stay
Admit: 2022-05-19 | Discharge: 2022-05-19 | Disposition: A | Discharge status: Home / Self Care | Payer: Self-pay | Attending: Cardiology | Admitting: Cardiology

## 2022-05-19 ENCOUNTER — Observation Stay: Payer: Self-pay

## 2022-05-19 ENCOUNTER — Encounter: Payer: Self-pay | Admitting: Anesthesiology

## 2022-05-19 ENCOUNTER — Other Ambulatory Visit: Payer: Self-pay

## 2022-05-19 DIAGNOSIS — I10 Essential (primary) hypertension: Secondary | ICD-10-CM

## 2022-05-19 DIAGNOSIS — K921 Melena: Secondary | ICD-10-CM

## 2022-05-19 DIAGNOSIS — E669 Obesity, unspecified: Secondary | ICD-10-CM

## 2022-05-19 DIAGNOSIS — R072 Precordial pain: Secondary | ICD-10-CM

## 2022-05-19 LAB — HIV ANTIBODY (ROUTINE TESTING W REFLEX): HIV Screen 4th Generation wRfx: NONREACTIVE

## 2022-05-19 LAB — TROPONIN I (HIGH SENSITIVITY): Troponin I (High Sensitivity): 9 ng/L (ref <18)

## 2022-05-19 LAB — HEMOGLOBIN: Hemoglobin: 12.5 g/dL (ref 12.0–15.0)

## 2022-05-19 MED ORDER — AMLODIPINE BESYLATE 5 MG PO TABS
10.0000 mg | ORAL_TABLET | Freq: Every day | ORAL | Status: DC
  Administered 2022-05-19: 10 mg via ORAL
  Filled 2022-05-19: qty 2

## 2022-05-19 MED ORDER — METOPROLOL TARTRATE 25 MG PO TABS
25.0000 mg | ORAL_TABLET | Freq: Two times a day (BID) | ORAL | Status: DC
  Administered 2022-05-19: 25 mg via ORAL
  Filled 2022-05-19: qty 1

## 2022-05-19 MED ORDER — ONDANSETRON HCL 4 MG/2ML IJ SOLN
4.0000 mg | Freq: Four times a day (QID) | INTRAMUSCULAR | Status: DC | PRN
  Administered 2022-05-19: 4 mg via INTRAVENOUS
  Filled 2022-05-19: qty 2

## 2022-05-19 MED ORDER — TECHNETIUM TC 99M TETROFOSMIN IV KIT
9.9200 | PACK | Freq: Once | INTRAVENOUS | Status: AC | PRN
  Administered 2022-05-19: 9.92 via INTRAVENOUS

## 2022-05-19 MED ORDER — MORPHINE SULFATE (PF) 2 MG/ML IV SOLN
2.0000 mg | INTRAVENOUS | Status: DC | PRN
  Administered 2022-05-19 (×2): 2 mg via INTRAVENOUS
  Filled 2022-05-19 (×2): qty 1

## 2022-05-19 MED ORDER — ENOXAPARIN SODIUM 40 MG/0.4ML IJ SOSY: 40.0000 mg | PREFILLED_SYRINGE | INTRAMUSCULAR | Status: DC

## 2022-05-19 MED ORDER — NITROGLYCERIN 0.4 MG SL SUBL
0.4000 mg | SUBLINGUAL_TABLET | SUBLINGUAL | Status: DC | PRN
  Administered 2022-05-19: 0.4 mg via SUBLINGUAL
  Filled 2022-05-19: qty 1

## 2022-05-19 MED ORDER — ASPIRIN 81 MG PO CHEW
81.0000 mg | CHEWABLE_TABLET | Freq: Every day | ORAL | Status: DC
  Administered 2022-05-19: 81 mg via ORAL
  Filled 2022-05-19: qty 1

## 2022-05-19 MED ORDER — TECHNETIUM TC 99M TETROFOSMIN IV KIT
30.4700 | PACK | Freq: Once | INTRAVENOUS | Status: AC | PRN
  Administered 2022-05-19: 30.47 via INTRAVENOUS

## 2022-05-19 MED ORDER — LABETALOL HCL 5 MG/ML IV SOLN
10.0000 mg | Freq: Once | INTRAVENOUS | Status: AC
  Administered 2022-05-19: 10 mg via INTRAVENOUS
  Filled 2022-05-19: qty 4

## 2022-05-19 MED ORDER — PANTOPRAZOLE SODIUM 40 MG IV SOLR
40.0000 mg | INTRAVENOUS | Status: DC
  Administered 2022-05-19: 40 mg via INTRAVENOUS
  Filled 2022-05-19: qty 10

## 2022-05-19 MED ORDER — NITROGLYCERIN 2 % TD OINT
0.5000 [in_us] | TOPICAL_OINTMENT | Freq: Four times a day (QID) | TRANSDERMAL | Status: DC
  Administered 2022-05-19 (×2): 0.5 [in_us] via TOPICAL
  Filled 2022-05-19 (×2): qty 1

## 2022-05-19 MED ORDER — ATORVASTATIN CALCIUM 20 MG PO TABS
80.0000 mg | ORAL_TABLET | Freq: Every day | ORAL | Status: DC
  Administered 2022-05-19: 80 mg via ORAL
  Filled 2022-05-19: qty 4

## 2022-05-19 MED ORDER — POTASSIUM CHLORIDE CRYS ER 20 MEQ PO TBCR
40.0000 meq | EXTENDED_RELEASE_TABLET | Freq: Once | ORAL | Status: AC
Start: 2022-05-19 — End: 2022-05-19
  Administered 2022-05-19: 40 meq via ORAL
  Filled 2022-05-19: qty 2

## 2022-05-19 MED ORDER — REGADENOSON 0.4 MG/5ML IV SOLN
0.4000 mg | Freq: Once | INTRAVENOUS | Status: AC
  Administered 2022-05-19: 0.4 mg via INTRAVENOUS

## 2022-05-19 MED ORDER — ACETAMINOPHEN 325 MG PO TABS: 650.0000 mg | ORAL_TABLET | ORAL | Status: DC | PRN

## 2022-05-19 MED ORDER — PANTOPRAZOLE SODIUM 40 MG IV SOLR
40.0000 mg | Freq: Two times a day (BID) | INTRAVENOUS | Status: DC
  Administered 2022-05-19: 40 mg via INTRAVENOUS
  Filled 2022-05-19: qty 10

## 2022-05-19 NOTE — ED Notes (Signed)
Pt gave verbal consent for Dc

## 2022-05-19 NOTE — Consult Note (Addendum)
Brenda Darby, MD [address]  [place]  [place], [place] [address]  Main: [phone]  Fax: [phone] Pager: [phone]   Consultation  Referring Provider:     No ref. provider found Primary Care Physician:  Patient, No Pcp Per (Inactive) Primary Gastroenterologist: Althia Forts        Reason for Consultation:     Black tarry stools  Date of Admission:  05/18/2022 Date of Consultation:  05/19/2022         HPI:   Brenda Todd is a 41 y.o. female history of coronary disease s/p PCI, stent placement, history of hypertension presented to the ER with retrosternal chest pain radiating to the left arm, neck and back associated with nausea and shortness of breath.  Apparently patient has not been on any medications for last 6 months.  She also reports that she had black stool yesterday and told ER that it happened after she took Pepto-Bismol.  She was hypertensive in the ER, troponins negative, mild leukocytosis, hemoglobin was 13.9 on arrival which is the same as baseline.  Normal BUN/creatinine.,  Normal platelets.  EKG unrevealing.  Cardiology is also consulted along with GI.  Patient is scheduled to undergo echo and stress test today.  She is started on Protonix 40 mg IV daily.   NSAIDs: None  Antiplts/Anticoagulants/Anti thrombotics: None  GI Procedures: None  Past Medical History:  Diagnosis Date   Chronic hypertension    Kidney calculi    MI (myocardial infarction) ([place])    UTI (lower urinary tract infection)     Past Surgical History:  Procedure Laterality Date   APPENDECTOMY     l/s. ruptured   CARDIAC SURGERY     CESAREAN SECTION N/A 12/17/2015   Procedure: CESAREAN SECTION;  Surgeon: Honor Loh Ward, MD;  Location: [facility] ORS;  Service: Obstetrics;  Laterality: N/A;   LEFT HEART CATH AND CORONARY ANGIOGRAPHY N/A 09/02/2020   Procedure: LEFT HEART CATH AND CORONARY ANGIOGRAPHY;  Surgeon: Nelva Bush, MD;  Location: [facility];  Service:  Cardiovascular;  Laterality: N/A;   LITHOTRIPSY      Current Facility-Administered Medications:    acetaminophen (TYLENOL) tablet 650 mg, 650 mg, Oral, Q4H PRN, Athena Masse, MD   amLODipine (NORVASC) tablet 10 mg, 10 mg, Oral, Daily, Judd Gaudier V, MD   aspirin chewable tablet 81 mg, 81 mg, Oral, Daily, Judd Gaudier V, MD   atorvastatin (LIPITOR) tablet 80 mg, 80 mg, Oral, Daily, Judd Gaudier V, MD   metoprolol tartrate (LOPRESSOR) tablet 25 mg, 25 mg, Oral, BID, Judd Gaudier V, MD, 25 mg at 05/19/22 0552   morphine (PF) 2 MG/ML injection 2 mg, 2 mg, Intravenous, Q2H PRN, Judd Gaudier V, MD, 2 mg at 05/19/22 0552   nitroGLYCERIN (NITROGLYN) 2 % ointment 0.5 inch, 0.5 inch, Topical, Q6H, Athena Masse, MD, 0.5 inch at 05/19/22 0717   ondansetron (ZOFRAN) injection 4 mg, 4 mg, Intravenous, Q6H PRN, Athena Masse, MD, 4 mg at 05/19/22 0210   pantoprazole (PROTONIX) injection 40 mg, 40 mg, Intravenous, Q12H, Brenda Todd, Tally Due, MD  Current Outpatient Medications:    aspirin 81 MG chewable tablet, Chew 1 tablet (81 mg total) by mouth daily., Disp: 90 tablet, Rfl: 1   ibuprofen (ADVIL) 200 MG tablet, Take 400 mg by mouth every 6 (six) hours as needed for mild pain, headache or fever., Disp: , Rfl:    amLODipine (NORVASC) 10 MG tablet, Take 1 tablet (10 mg  total) by mouth daily. (Patient not taking: Reported on 05/19/2022), Disp: 30 tablet, Rfl: 1   atorvastatin (LIPITOR) 80 MG tablet, Take 1 tablet (80 mg total) by mouth daily. (Patient not taking: Reported on 05/19/2022), Disp: 30 tablet, Rfl: 0   budesonide (EQ BUDESONIDE NASAL) 32 MCG/ACT nasal spray, Place 1 spray into both nostrils daily. (Patient not taking: Reported on 05/19/2022), Disp: 8.43 mL, Rfl: 0   isosorbide mononitrate (IMDUR) 30 MG 24 hr tablet, Take 1 tablet (30 mg total) by mouth daily. (Patient not taking: Reported on 05/19/2022), Disp: 30 tablet, Rfl: 0   levocetirizine (XYZAL) 5 MG tablet, Take 1 tablet (5 mg total)  by mouth every evening. (Patient not taking: Reported on 05/19/2022), Disp: 30 tablet, Rfl: 0   metoprolol tartrate (LOPRESSOR) 25 MG tablet, Take 1 tablet (25 mg total) by mouth 2 (two) times daily. (Patient not taking: Reported on 05/19/2022), Disp: 60 tablet, Rfl: 1   predniSONE (STERAPRED UNI-PAK 21 TAB) 10 MG (21) TBPK tablet, Take by mouth daily. Take 6 tabs by mouth daily for 2 days, then 5 tabs for 2 days, then 4 tabs for 2 days, then 3 tabs for 2 days, 2 tabs for 2 days, then 1 tab by mouth daily for 2 days. DO NOT START UNTIL AFTER COMPLETING THE ANTIBIOTIC! (Patient not taking: Reported on 05/19/2022), Disp: 42 tablet, Rfl: 0    History reviewed. No pertinent family history.   Social History   Tobacco Use   Smoking status: Every Day    Packs/day: 0.25    Types: Cigarettes   Smokeless tobacco: Never  Vaping Use   Vaping Use: Never used  Substance Use Topics   Alcohol use: No    Alcohol/week: 0.0 standard drinks of alcohol   Drug use: Yes    Types: Marijuana    Allergies as of 05/18/2022 - Review Complete 05/18/2022  Allergen Reaction Noted   No healthtouch food allergies Anaphylaxis and Other (See Comments) 08/31/2020   Penicillins Anaphylaxis 04/05/2012    Review of Systems:    All systems reviewed and negative except where noted in HPI.   Physical Exam:  Vital signs in last 24 hours: Temp:  [98.3 F (36.8 C)-98.4 F (36.9 C)] 98.3 F (36.8 C) (06/14 0718) Pulse Rate:  [68-89] 75 (06/14 0830) Resp:  [17-24] 21 (06/14 0830) BP: (133-220)/(69-122) 141/83 (06/14 0830) SpO2:  [95 %-100 %] 96 % (06/14 0830) Weight:  [78.9 kg] 78.9 kg (06/13 2057)   General:   Pleasant, cooperative in NAD Head:  Normocephalic and atraumatic. Eyes:   No icterus.   Conjunctiva pink. PERRLA. Ears:  Normal auditory acuity. Neck:  Supple; no masses or thyroidomegaly Lungs: Respirations even and unlabored. Lungs clear to auscultation bilaterally.   No wheezes, crackles, or rhonchi.   Heart:  Regular rate and rhythm;  Without murmur, clicks, rubs or gallops Abdomen:  Soft, nondistended, nontender. Normal bowel sounds. No appreciable masses or hepatomegaly.  No rebound or guarding.  Rectal:  Not performed. Msk:  Symmetrical without gross deformities.  Strength normal Extremities:  Without edema, cyanosis or clubbing. Neurologic:  Alert and oriented x3;  grossly normal neurologically. Skin:  Intact without significant lesions or rashes. Psych:  Alert and cooperative. Normal affect.  LAB RESULTS:    Latest Ref Rng & Units 05/19/2022    5:58 AM 05/18/2022    9:02 PM 09/10/2021    9:42 PM  CBC  WBC 4.0 - 10.5 K/uL  14.1  12.9   Hemoglobin 12.0 -  15.0 g/dL 12.5  13.9  14.0   Hematocrit 36.0 - 46.0 %  42.7  41.2   Platelets 150 - 400 K/uL  270  271     BMET    Latest Ref Rng & Units 05/18/2022    9:02 PM 09/10/2021    9:42 PM 04/29/2021    3:53 AM  BMP  Glucose 70 - 99 mg/dL 95  99  107   BUN 6 - 20 mg/dL 12  17  21    Creatinine 0.44 - 1.00 mg/dL 0.62  0.77  0.73   Sodium 135 - 145 mmol/L 137  137  137   Potassium 3.5 - 5.1 mmol/L 3.1  3.5  3.5   Chloride 98 - 111 mmol/L 104  102  103   CO2 22 - 32 mmol/L 24  25  26    Calcium 8.9 - 10.3 mg/dL 8.7  9.0  8.8     LFT    Latest Ref Rng & Units 05/18/2022    9:02 PM 09/10/2021    9:42 PM 04/28/2021    1:32 PM  Hepatic Function  Total Protein 6.5 - 8.1 g/dL 7.0  7.0  7.6   Albumin 3.5 - 5.0 g/dL 3.9  3.9  4.0   AST 15 - 41 U/L 16  21  18    ALT 0 - 44 U/L 18  12  16    Alk Phosphatase 38 - 126 U/L 47  48  56   Total Bilirubin 0.3 - 1.2 mg/dL 0.6  0.9  0.8   Bilirubin, Direct 0.0 - 0.2 mg/dL <0.1  0.2  <0.1      STUDIES: CT HEAD WO CONTRAST (5MM)  Result Date: 05/18/2022 CLINICAL DATA:  Neuro deficit, acute, stroke suspected EXAM: CT HEAD WITHOUT CONTRAST TECHNIQUE: Contiguous axial images were obtained from the base of the skull through the vertex without intravenous contrast. RADIATION DOSE REDUCTION: This  exam was performed according to the departmental dose-optimization program which includes automated exposure control, adjustment of the mA and/or kV according to patient size and/or use of iterative reconstruction technique. COMPARISON:  None Available. FINDINGS: Brain: No acute intracranial abnormality. Specifically, no hemorrhage, hydrocephalus, mass lesion, acute infarction, or significant intracranial injury. Vascular: No hyperdense vessel or unexpected calcification. Skull: No acute calvarial abnormality. Sinuses/Orbits: No acute findings Other: None IMPRESSION: No acute intracranial abnormality. Electronically Signed   By: Rolm Baptise M.D.   On: 05/18/2022 23:32   CT Angio Chest/Abd/Pel for Dissection W and/or Wo Contrast  Result Date: 05/18/2022 CLINICAL DATA:  Chest pain or back pain, aortic dissection suspected EXAM: CT ANGIOGRAPHY CHEST, ABDOMEN AND PELVIS TECHNIQUE: Non-contrast CT of the chest was initially obtained. Multidetector CT imaging through the chest, abdomen and pelvis was performed using the standard protocol during bolus administration of intravenous contrast. Multiplanar reconstructed images and MIPs were obtained and reviewed to evaluate the vascular anatomy. RADIATION DOSE REDUCTION: This exam was performed according to the departmental dose-optimization program which includes automated exposure control, adjustment of the mA and/or kV according to patient size and/or use of iterative reconstruction technique. CONTRAST:  10m OMNIPAQUE IOHEXOL 350 MG/ML SOLN COMPARISON:  04/28/2021 FINDINGS: CTA CHEST FINDINGS Cardiovascular: Preferential opacification of the thoracic aorta. No evidence of thoracic aortic aneurysm or dissection. Normal heart size. No pericardial effusion. Mediastinum/Nodes: No enlarged mediastinal, hilar, or axillary lymph nodes. Thyroid gland, trachea, and esophagus demonstrate no significant findings. Lungs/Pleura: Lungs are clear. No pleural effusion or  pneumothorax. Musculoskeletal: Chest wall soft tissues are unremarkable.  No acute bony abnormality. Review of the MIP images confirms the above findings. CTA ABDOMEN AND PELVIS FINDINGS VASCULAR Aorta: Normal caliber aorta without aneurysm, dissection, vasculitis or significant stenosis. Celiac: Patent without evidence of aneurysm, dissection, vasculitis or significant stenosis. SMA: Patent without evidence of aneurysm, dissection, vasculitis or significant stenosis. Renals: Both renal arteries are patent without evidence of aneurysm, dissection, vasculitis, fibromuscular dysplasia or significant stenosis. IMA: Patent without evidence of aneurysm, dissection, vasculitis or significant stenosis. Inflow: Patent without evidence of aneurysm, dissection, vasculitis or significant stenosis. Veins: No obvious venous abnormality within the limitations of this arterial phase study. No focal hepatic abnormality. Gallbladder unremarkable. Review of the MIP images confirms the above findings. NON-VASCULAR Hepatobiliary: No focal hepatic abnormality. Gallbladder unremarkable. Pancreas: No focal abnormality or ductal dilatation. Spleen: This No focal abnormality.  Normal size. Adrenals/Urinary Tract: No adrenal abnormality. No focal renal abnormality. No stones or hydronephrosis. Urinary bladder is unremarkable. Stomach/Bowel: Act Stomach, large and small bowel grossly unremarkable. Lymphatic: No adenopathy Reproductive: Uterus and left adnexa unremarkable. 3.8 cm cyst in the right ovary. Other: No free fluid or free air. Musculoskeletal: No acute bony abnormality. Review of the MIP images confirms the above findings. IMPRESSION: No evidence of aortic aneurysm or dissection. No acute findings in the chest, abdomen or pelvis. 3.8 cm right ovarian cyst. No follow-up imaging recommended. Note: This recommendation does not apply to premenarchal patients and to those with increased risk (genetic, family history, elevated tumor  markers or other high-risk factors) of ovarian cancer. Reference: JACR 2020 Feb; 17(2):248-254 Electronically Signed   By: Rolm Baptise M.D.   On: 05/18/2022 23:31   DG Chest 2 View  Result Date: 05/18/2022 CLINICAL DATA:  Chest pain EXAM: CHEST - 2 VIEW COMPARISON:  09/10/2021 FINDINGS: The heart size and mediastinal contours are within normal limits. Both lungs are clear. The visualized skeletal structures are unremarkable. IMPRESSION: No active cardiopulmonary disease. Electronically Signed   By: Donavan Foil M.D.   On: 05/18/2022 21:20      Impression / Plan:   Brenda Todd is a 41 y.o. female with history of hypertension, coronary artery disease s/p PCI with 2 stents presented with chest pain as well as black tarry stools.  Black stool secondary to Pepto-Bismol Hemoglobin is normal and at baseline, normal BUN/creatinine No further work-up from GI standpoint  Thank you for involving me in the care of this patient.      LOS: 0 days   Sherri Sear, MD  05/19/2022, 9:02 AM    Note: This dictation was prepared with Dragon dictation along with smaller phrase technology. Any transcriptional errors that result from this process are unintentional.

## 2022-05-19 NOTE — Consult Note (Signed)
[place] NOTE       Patient ID: Brenda Todd MRN: [id number] DOB/AGE: 1981-11-11 41 y.o.  Admit date: 05/18/2022 Referring Physician Dr. Judd Gaudier  Primary Physician none Primary Cardiologist Nehemiah Massed  Reason for Consultation chest pain  HPI: Brenda Todd is a 58yoF with a PMH of CAD s/p PCI x2 in 2018 to the [place] and rPL1 (widely patent by [place] in 2021), 99% stenosis and small OM 2 medically managed, mild to moderate nonobstructive disease in the LCx and RCA, hypertension, hyperlipidemia who presented to [facility] ED 05/18/2022 with chest discomfort.  Cardiology is consulted for further assistance.  She states yesterday she was at work Medical laboratory scientific officer at [employer]) when she suddenly felt like something was sitting on her chest with associated nausea, heart racing, and sweating.  She said the chest pressure was a 9 out of 10 and she felt like she was having an anxiety attack but notes she does not have a history of anxiety.  The pain radiates up into the left side of her neck.  She says this pressure was similar to when she had her heart attack in 2018, but at that time she had a syncopal episode, and also has some numbness in her left fingertips that has been going on for a few days.  The last time she had this chest pressure was last year (November) and had a negative work-up in the ED.  She admits her pain is worse with laying flat and somewhat relieved with leaning forward, overall she feels like her pain went away as soon as she came to the ER and was told that she was not having an active heart attack.  She has dependent bilateral lower extremity edema that resolves with elevation of her lower legs.  She also admits to 1 episode of melena, GI was consulted for this and plans on doing an EGD at some point.  Unfortunately, she has trouble affording her medications as has only been taking her baby aspirin daily.  She has not been able to follow-up with cardiology or a PCP  outpatient for financial reasons as well.  At my time of evaluation this morning she is conversational and comfortable appearing, although rates her chest pressure about a 6.5 out of 10.   Vitals are notable for a severely elevated blood pressure on presentation at 220/114, SPO2 97% on room air and heart rate 89 in sinus rhythm on telemetry.  Labs are notable for a potassium of 3.1, BUN/creatinine 12/0.62, EGFR greater than 60.  Troponins -9-9.  Slight leukocytosis to 14, H&H slightly downtrending at 13.9-12.5/42.7   Review of systems complete and found to be negative unless listed above     Past Medical History:  Diagnosis Date   Chronic hypertension    Kidney calculi    MI (myocardial infarction) ([place])    UTI (lower urinary tract infection)     Past Surgical History:  Procedure Laterality Date   APPENDECTOMY     l/s. ruptured   CARDIAC SURGERY     CESAREAN SECTION N/A 12/17/2015   Procedure: CESAREAN SECTION;  Surgeon: Honor Loh Ward, MD;  Location: [facility] ORS;  Service: Obstetrics;  Laterality: N/A;   LEFT HEART CATH AND CORONARY ANGIOGRAPHY N/A 09/02/2020   Procedure: LEFT HEART CATH AND CORONARY ANGIOGRAPHY;  Surgeon: Nelva Bush, MD;  Location: [facility];  Service: Cardiovascular;  Laterality: N/A;   LITHOTRIPSY      (Not in a hospital admission)  Social  History   Socioeconomic History   Marital status: Divorced    Spouse name: Not on file   Number of children: Not on file   Years of education: Not on file   Highest education level: Not on file  Occupational History   Not on file  Tobacco Use   Smoking status: Every Day    Packs/day: 0.25    Types: Cigarettes   Smokeless tobacco: Never  Vaping Use   Vaping Use: Never used  Substance and Sexual Activity   Alcohol use: No    Alcohol/week: 0.0 standard drinks of alcohol   Drug use: Yes    Types: Marijuana   Sexual activity: Yes    Birth control/protection: Condom  Other Topics Concern   Not on  file  Social History Narrative   Not on file   Social Determinants of Health   Financial Resource Strain: Not on file  Food Insecurity: Not on file  Transportation Needs: Not on file  Physical Activity: Not on file  Stress: Not on file  Social Connections: Not on file  Intimate Partner Violence: Not on file    History reviewed. No pertinent family history.    PHYSICAL EXAM General: Pleasant Caucasian female, well nourished, in no acute distress.  Laying nearly flat in ED stretcher HEENT:  Normocephalic and atraumatic. Neck:  No JVD.  Lungs: Normal respiratory effort on room air. Clear bilaterally to auscultation. No wheezes, crackles, rhonchi.  Heart: HRRR . Normal S1 and S2 without gallops or murmurs. Radial & DP pulses 2+ bilaterally. Abdomen: Non-distended appearing.  Msk: Normal strength and tone for age. Extremities: Warm and well perfused. No clubbing, cyanosis.  No peripheral edema.  Neuro: Alert and oriented X 3. Psych:  Answers questions appropriately.   Labs:   Lab Results  Component Value Date   WBC 14.1 (H) 05/18/2022   HGB 12.5 05/19/2022   HCT 42.7 05/18/2022   MCV 80.7 05/18/2022   PLT 270 05/18/2022    Recent Labs  Lab 05/18/22 2102  NA 137  K 3.1*  CL 104  CO2 24  BUN 12  CREATININE 0.62  CALCIUM 8.7*  PROT 7.0  BILITOT 0.6  ALKPHOS 47  ALT 18  AST 16  GLUCOSE 95   No results found for: "CKTOTAL", "CKMB", "CKMBINDEX", "TROPONINI"  Lab Results  Component Value Date   CHOL 180 09/01/2020   CHOL 220 (H) 01/25/2020   Lab Results  Component Value Date   HDL 35 (L) 09/01/2020   HDL 36 (L) 01/25/2020   Lab Results  Component Value Date   LDLCALC 95 09/01/2020   LDLCALC 131 (H) 01/25/2020   Lab Results  Component Value Date   TRIG 250 (H) 09/01/2020   TRIG 263 (H) 01/25/2020   Lab Results  Component Value Date   CHOLHDL 5.1 09/01/2020   CHOLHDL 6.1 01/25/2020   No results found for: "LDLDIRECT"    Radiology: CT HEAD WO  CONTRAST (5MM)  Result Date: 05/18/2022 CLINICAL DATA:  Neuro deficit, acute, stroke suspected EXAM: CT HEAD WITHOUT CONTRAST TECHNIQUE: Contiguous axial images were obtained from the base of the skull through the vertex without intravenous contrast. RADIATION DOSE REDUCTION: This exam was performed according to the departmental dose-optimization program which includes automated exposure control, adjustment of the mA and/or kV according to patient size and/or use of iterative reconstruction technique. COMPARISON:  None Available. FINDINGS: Brain: No acute intracranial abnormality. Specifically, no hemorrhage, hydrocephalus, mass lesion, acute infarction, or significant intracranial injury. Vascular:  No hyperdense vessel or unexpected calcification. Skull: No acute calvarial abnormality. Sinuses/Orbits: No acute findings Other: None IMPRESSION: No acute intracranial abnormality. Electronically Signed   By: Rolm Baptise M.D.   On: 05/18/2022 23:32   CT Angio Chest/Abd/Pel for Dissection W and/or Wo Contrast  Result Date: 05/18/2022 CLINICAL DATA:  Chest pain or back pain, aortic dissection suspected EXAM: CT ANGIOGRAPHY CHEST, ABDOMEN AND PELVIS TECHNIQUE: Non-contrast CT of the chest was initially obtained. Multidetector CT imaging through the chest, abdomen and pelvis was performed using the standard protocol during bolus administration of intravenous contrast. Multiplanar reconstructed images and MIPs were obtained and reviewed to evaluate the vascular anatomy. RADIATION DOSE REDUCTION: This exam was performed according to the departmental dose-optimization program which includes automated exposure control, adjustment of the mA and/or kV according to patient size and/or use of iterative reconstruction technique. CONTRAST:  69m OMNIPAQUE IOHEXOL 350 MG/ML SOLN COMPARISON:  04/28/2021 FINDINGS: CTA CHEST FINDINGS Cardiovascular: Preferential opacification of the thoracic aorta. No evidence of thoracic aortic  aneurysm or dissection. Normal heart size. No pericardial effusion. Mediastinum/Nodes: No enlarged mediastinal, hilar, or axillary lymph nodes. Thyroid gland, trachea, and esophagus demonstrate no significant findings. Lungs/Pleura: Lungs are clear. No pleural effusion or pneumothorax. Musculoskeletal: Chest wall soft tissues are unremarkable. No acute bony abnormality. Review of the MIP images confirms the above findings. CTA ABDOMEN AND PELVIS FINDINGS VASCULAR Aorta: Normal caliber aorta without aneurysm, dissection, vasculitis or significant stenosis. Celiac: Patent without evidence of aneurysm, dissection, vasculitis or significant stenosis. SMA: Patent without evidence of aneurysm, dissection, vasculitis or significant stenosis. Renals: Both renal arteries are patent without evidence of aneurysm, dissection, vasculitis, fibromuscular dysplasia or significant stenosis. IMA: Patent without evidence of aneurysm, dissection, vasculitis or significant stenosis. Inflow: Patent without evidence of aneurysm, dissection, vasculitis or significant stenosis. Veins: No obvious venous abnormality within the limitations of this arterial phase study. No focal hepatic abnormality. Gallbladder unremarkable. Review of the MIP images confirms the above findings. NON-VASCULAR Hepatobiliary: No focal hepatic abnormality. Gallbladder unremarkable. Pancreas: No focal abnormality or ductal dilatation. Spleen: This No focal abnormality.  Normal size. Adrenals/Urinary Tract: No adrenal abnormality. No focal renal abnormality. No stones or hydronephrosis. Urinary bladder is unremarkable. Stomach/Bowel: Act Stomach, large and small bowel grossly unremarkable. Lymphatic: No adenopathy Reproductive: Uterus and left adnexa unremarkable. 3.8 cm cyst in the right ovary. Other: No free fluid or free air. Musculoskeletal: No acute bony abnormality. Review of the MIP images confirms the above findings. IMPRESSION: No evidence of aortic aneurysm  or dissection. No acute findings in the chest, abdomen or pelvis. 3.8 cm right ovarian cyst. No follow-up imaging recommended. Note: This recommendation does not apply to premenarchal patients and to those with increased risk (genetic, family history, elevated tumor markers or other high-risk factors) of ovarian cancer. Reference: JACR 2020 Feb; 17(2):248-254 Electronically Signed   By: KRolm BaptiseM.D.   On: 05/18/2022 23:31   DG Chest 2 View  Result Date: 05/18/2022 CLINICAL DATA:  Chest pain EXAM: CHEST - 2 VIEW COMPARISON:  09/10/2021 FINDINGS: The heart size and mediastinal contours are within normal limits. Both lungs are clear. The visualized skeletal structures are unremarkable. IMPRESSION: No active cardiopulmonary disease. Electronically Signed   By: KDonavan FoilM.D.   On: 05/18/2022 21:20    ECHO 09/01/2020  1. Left ventricular ejection fraction, by estimation, is 55 to 60%. The  left ventricle has normal function. The left ventricle has no regional  wall motion abnormalities. There is moderate left ventricular hypertrophy.  Left ventricular diastolic  parameters are consistent with Grade II diastolic dysfunction  (pseudonormalization).   2. Right ventricular systolic function is normal. The right ventricular  size is normal. There is mildly elevated pulmonary artery systolic  pressure.   3. Left atrial size was moderately dilated.   4. The mitral valve is normal in structure. Mild mitral valve  regurgitation. No evidence of mitral stenosis.   5. The aortic valve is normal in structure. Aortic valve regurgitation is  not visualized. No aortic stenosis is present.   6. The inferior vena cava is normal in size with <50% respiratory  variability, suggesting right atrial pressure of 8 mmHg.   TELEMETRY reviewed by me: Sinus rhythm rate 70s-80s  EKG reviewed by me: Sinus rhythm rate 77, LVH, no acute ST/T wave changes  ASSESSMENT AND PLAN:  Brenda Todd is a 61yoF with a PMH of  CAD s/p PCI x2 in 2018 to the rPDA and rPL1 (widely patent by [place] in 2021), 99% stenosis and small OM 2 medically managed, mild to moderate nonobstructive disease in the LCx and RCA, hypertension, hyperlipidemia who presented to [facility] ED 05/18/2022 with chest discomfort.  Cardiology is consulted for further assistance.  #CAD s/p PCI x2 in 2018 with angina #Hypertensive urgency Patient presents with 9 out of 10 chest pressure described as "something sitting on her chest" with associated nausea, heart racing, diaphoresis.  Chest pressure eased off somewhat once she came to the ED, but still rates it as a 6.5 out of 10 during interview, although she is conversational and comfortable appearing.  Notably, her blood pressure was severely elevated at 220/110 on arrival, improving with amlodipine, labetalol, Nitropaste.  Her troponins are negative on 2 repeats at 9-9, EKG without acute ischemic changes, not suggestive of ACS.  With her history of CAD and stent placement and her symptoms, functional study should be obtained for further work-up prior to discharge and prior to EGD for evaluation of her episode of melena. -S/p 325 mg aspirin, continue 81 mg aspirin daily -Defer heparin drip due to negative troponins -Continue metoprolol tartrate 25 mg twice daily -Nitropaste, as needed pain meds -Continue high intensity statin, atorva 58m  -Repeat echocardiogram complete -Plan for functional study with Lexiscan Myoview today at 12pm, further recommendations based on results  This patient's plan of care was discussed and created with Dr. CClayborn Bignessand he is in agreement.  Signed: LTristan Schroeder, PA-C 05/19/2022, 8:13 AM KMad[facility] Hospital[facility]

## 2022-05-19 NOTE — Anesthesia Preprocedure Evaluation (Deleted)
Anesthesia Evaluation  Patient identified by MRN, date of birth, ID band Patient awake    Reviewed: Allergy & Precautions, NPO status , Patient's Chart, lab work & pertinent test results, reviewed documented beta blocker date and time   Airway Mallampati: III  TM Distance: >3 FB Neck ROM: full    Dental  (+) Chipped   Pulmonary Current Smoker,    Pulmonary exam normal        Cardiovascular hypertension, Pt. on medications and Pt. on home beta blockers + Past MI and + Cardiac Stents  Normal cardiovascular exam  Chest pain: w / hx MI s/p stents x2. Troponins neg x 2. Cardiology consulted Hypertensive urgency: w/ hx of noncompliance w/ meds x over 6 months.  EKG 6/14: Sinus rhythm Biatrial enlargement Left ventricular hypertrophy   Neuro/Psych negative neurological ROS  negative psych ROS   GI/Hepatic (+)     substance abuse  marijuana use, melena   Endo/Other  negative endocrine ROS  Renal/GU   negative genitourinary   Musculoskeletal   Abdominal   Peds  Hematology negative hematology ROS (+)   Anesthesia Other Findings Hypokalemia  Past Medical History: No date: Chronic hypertension No date: Kidney calculi No date: MI (myocardial infarction) ([place]) No date: UTI (lower urinary tract infection)  Past Surgical History: No date: APPENDECTOMY     Comment:  l/s. ruptured No date: CARDIAC SURGERY 12/17/2015: CESAREAN SECTION; N/A     Comment:  Procedure: CESAREAN SECTION;  Surgeon: Honor Loh Ward,               MD;  Location: [facility] ORS;  Service: Obstetrics;                Laterality: N/A; 09/02/2020: LEFT HEART CATH AND CORONARY ANGIOGRAPHY; N/A     Comment:  Procedure: LEFT HEART CATH AND CORONARY ANGIOGRAPHY;                Surgeon: Nelva Bush, MD;  Location: [place]               [facility];  Service: Cardiovascular;  Laterality: N/A; No date: LITHOTRIPSY  BMI    Body Mass Index: 30.82 kg/m       Reproductive/Obstetrics negative OB ROS                            Anesthesia Physical Anesthesia Plan  ASA: 3  Anesthesia Plan: General   Post-op Pain Management:    Induction: Intravenous  PONV Risk Score and Plan: Propofol infusion and TIVA  Airway Management Planned: Natural Airway  Additional Equipment:   Intra-op Plan:   Post-operative Plan:   Informed Consent:     Dental Advisory Given  Plan Discussed with: Anesthesiologist, CRNA and Surgeon  Anesthesia Plan Comments:         Anesthesia Quick Evaluation

## 2022-05-19 NOTE — Assessment & Plan Note (Addendum)
Reports an episode of black tarry stool We will keep n.p.o. GI consult IV Protonix SCDs for DVT prophylaxis

## 2022-05-19 NOTE — ED Notes (Signed)
Patient transported to nuc med

## 2022-05-19 NOTE — H&P (Signed)
History and Physical    Patient: Brenda Todd:[id number] DOB: January 04, 1981 DOA: 05/18/2022 DOS: the patient was seen and examined on 05/19/2022 PCP: Patient, No Pcp Per (Inactive)  Patient coming from: Home  Chief Complaint:  Chief Complaint  Patient presents with   Chest Pain    HPI: ADIA CRAMMER is a 41 y.o. female with medical history significant for CAD status post stent angioplasty with 2 stents, HTN, who relocated to [facility] from [place] 3 years prior and has yet to establish care with a cardiologist due to lack of healthcare insurance who presents to the ED with retrosternal chest pain radiating to the left arm, neck and back which she describes as an elephant sitting on her chest.  She had associated nausea and feels mildly short of breath.  She states that with her first MI she did not have chest pain but presented with a syncopal episode.  She has not taken any of her medications in over 6 months. She also reports having black tarry stools. ED course and data review: BP 220/114 on arrival with otherwise normal vitals Labs troponin 9-9, potassium 3.1, WBC 14.1, lipase and LFTs WNL EKG, personally viewed and interpreted with normal sinus rhythm with no acute ischemic changes. Chest x-ray with no active cardiopulmonary disease CTA chest abdomen and pelvis showing no evidence of aortic aneurysm or dissection and no acute findings in the chest abdomen and pelvis.  A 3.8 cm right ovarian cyst was noted with no follow-up imaging recommended. Patient was given a dose of amlodipine, chewable aspirin and morphine.  Hospitalist consulted for admission.   Review of Systems: As mentioned in the history of present illness. All other systems reviewed and are negative.  Past Medical History:  Diagnosis Date   Chronic hypertension    Kidney calculi    MI (myocardial infarction) ([place])    UTI (lower urinary tract infection)    Past Surgical History:  Procedure Laterality Date    APPENDECTOMY     l/s. ruptured   CARDIAC SURGERY     CESAREAN SECTION N/A 12/17/2015   Procedure: CESAREAN SECTION;  Surgeon: Honor Loh Ward, MD;  Location: [facility] ORS;  Service: Obstetrics;  Laterality: N/A;   LEFT HEART CATH AND CORONARY ANGIOGRAPHY N/A 09/02/2020   Procedure: LEFT HEART CATH AND CORONARY ANGIOGRAPHY;  Surgeon: Nelva Bush, MD;  Location: [facility] [place];  Service: Cardiovascular;  Laterality: N/A;   LITHOTRIPSY     Social History:  reports that she has been smoking cigarettes. She has been smoking an average of .25 packs per day. She has never used smokeless tobacco. She reports current drug use. Drug: Marijuana. She reports that she does not drink alcohol.  Allergies  Allergen Reactions   No [employer] Food Allergies Anaphylaxis and Other (See Comments)    Cherries   Penicillins Anaphylaxis and Hives    History reviewed. No pertinent family history.  Prior to Admission medications   Medication Sig Start Date End Date Taking? Authorizing Provider  aspirin 81 MG chewable tablet Chew 1 tablet (81 mg total) by mouth daily. 04/29/21  Yes Wouk, Ailene Rud, MD  ibuprofen (ADVIL) 200 MG tablet Take 400 mg by mouth every 6 (six) hours as needed for mild pain, headache or fever.   Yes [provider]  amLODipine (NORVASC) 10 MG tablet Take 1 tablet (10 mg total) by mouth daily. Patient not taking: Reported on 05/19/2022 01/05/22   Goran Olden Sams, PA-C  atorvastatin (LIPITOR) 80 MG  tablet Take 1 tablet (80 mg total) by mouth daily. Patient not taking: Reported on 05/19/2022 04/29/21 05/29/21  Gwynne Edinger, MD  budesonide (EQ BUDESONIDE NASAL) 32 MCG/ACT nasal spray Place 1 spray into both nostrils daily. Patient not taking: Reported on 05/19/2022 02/05/22   Geryl Councilman L, PA  isosorbide mononitrate (IMDUR) 30 MG 24 hr tablet Take 1 tablet (30 mg total) by mouth daily. Patient not taking: Reported on 05/19/2022 04/29/21 05/29/21  Gwynne Edinger, MD   levocetirizine (XYZAL) 5 MG tablet Take 1 tablet (5 mg total) by mouth every evening. Patient not taking: Reported on 05/19/2022 02/05/22   Geryl Councilman L, PA  metoprolol tartrate (LOPRESSOR) 25 MG tablet Take 1 tablet (25 mg total) by mouth 2 (two) times daily. Patient not taking: Reported on 05/19/2022 04/29/21   Gwynne Edinger, MD  predniSONE (STERAPRED UNI-PAK 21 TAB) 10 MG (21) TBPK tablet Take by mouth daily. Take 6 tabs by mouth daily for 2 days, then 5 tabs for 2 days, then 4 tabs for 2 days, then 3 tabs for 2 days, 2 tabs for 2 days, then 1 tab by mouth daily for 2 days. DO NOT START UNTIL AFTER COMPLETING THE ANTIBIOTIC! Patient not taking: Reported on 05/19/2022 02/05/22   Chaney Malling, [place]    Physical Exam: Vitals:   05/18/22 2130 05/18/22 2200 05/18/22 2330 05/19/22 0100  BP: (!) 193/109 (!) 174/115 (!) 205/122 (!) 194/113  Pulse: 86 83 82 76  Resp: 19 (!) 21 17   Temp:      TempSrc:      SpO2: 100% 99% 98% 100%  Weight:      Height:       Physical Exam Vitals and nursing note reviewed.  Constitutional:      General: She is not in acute distress. HENT:     Head: Normocephalic and atraumatic.  Cardiovascular:     Rate and Rhythm: Normal rate and regular rhythm.     Heart sounds: Normal heart sounds.  Pulmonary:     Effort: Pulmonary effort is normal.     Breath sounds: Normal breath sounds.  Abdominal:     Palpations: Abdomen is soft.     Tenderness: There is no abdominal tenderness.  Neurological:     Mental Status: Mental status is at baseline.     Labs on Admission: I have personally reviewed following labs and imaging studies  CBC: Recent Labs  Lab 05/18/22 2102  WBC 14.1*  HGB 13.9  HCT 42.7  MCV 80.7  PLT 128   Basic Metabolic Panel: Recent Labs  Lab 05/18/22 2102  NA 137  K 3.1*  CL 104  CO2 24  GLUCOSE 95  BUN 12  CREATININE 0.62  CALCIUM 8.7*   GFR: Estimated Creatinine Clearance: 92 mL/min (by C-G formula based on SCr of 0.62  mg/dL). Liver Function Tests: Recent Labs  Lab 05/18/22 2102  AST 16  ALT 18  ALKPHOS 47  BILITOT 0.6  PROT 7.0  ALBUMIN 3.9   Recent Labs  Lab 05/18/22 2102  LIPASE 31   No results for input(s): "AMMONIA" in the last 168 hours. Coagulation Profile: No results for input(s): "INR", "PROTIME" in the last 168 hours. Cardiac Enzymes: No results for input(s): "CKTOTAL", "CKMB", "CKMBINDEX", "TROPONINI" in the last 168 hours. BNP (last 3 results) No results for input(s): "PROBNP" in the last 8760 hours. HbA1C: No results for input(s): "HGBA1C" in the last 72 hours. CBG: No results for input(s): "GLUCAP"  in the last 168 hours. Lipid Profile: No results for input(s): "CHOL", "HDL", "LDLCALC", "TRIG", "CHOLHDL", "LDLDIRECT" in the last 72 hours. Thyroid Function Tests: No results for input(s): "TSH", "T4TOTAL", "FREET4", "T3FREE", "THYROIDAB" in the last 72 hours. Anemia Panel: No results for input(s): "VITAMINB12", "FOLATE", "FERRITIN", "TIBC", "IRON", "RETICCTPCT" in the last 72 hours. Urine analysis:    Component Value Date/Time   COLORURINE YELLOW (A) 04/28/2021 2000   APPEARANCEUR CLOUDY (A) 04/28/2021 2000   APPEARANCEUR Cloudy 12/11/2014 1620   LABSPEC >1.046 (H) 04/28/2021 2000   LABSPEC 1.014 12/11/2014 1620   PHURINE 5.0 04/28/2021 2000   GLUCOSEU NEGATIVE 04/28/2021 2000   GLUCOSEU Negative 12/11/2014 1620   HGBUR LARGE (A) 04/28/2021 2000   BILIRUBINUR NEGATIVE 04/28/2021 2000   BILIRUBINUR Negative 12/11/2014 1620   KETONESUR NEGATIVE 04/28/2021 2000   PROTEINUR 30 (A) 04/28/2021 2000   UROBILINOGEN 1.0 04/05/2012 1807   NITRITE NEGATIVE 04/28/2021 2000   LEUKOCYTESUR MODERATE (A) 04/28/2021 2000   LEUKOCYTESUR Negative 12/11/2014 1620    Radiological Exams on Admission: CT HEAD WO CONTRAST (5MM)  Result Date: 05/18/2022 CLINICAL DATA:  Neuro deficit, acute, stroke suspected EXAM: CT HEAD WITHOUT CONTRAST TECHNIQUE: Contiguous axial images were obtained  from the base of the skull through the vertex without intravenous contrast. RADIATION DOSE REDUCTION: This exam was performed according to the departmental dose-optimization program which includes automated exposure control, adjustment of the mA and/or kV according to patient size and/or use of iterative reconstruction technique. COMPARISON:  None Available. FINDINGS: Brain: No acute intracranial abnormality. Specifically, no hemorrhage, hydrocephalus, mass lesion, acute infarction, or significant intracranial injury. Vascular: No hyperdense vessel or unexpected calcification. Skull: No acute calvarial abnormality. Sinuses/Orbits: No acute findings Other: None IMPRESSION: No acute intracranial abnormality. Electronically Signed   By: Rolm Baptise M.D.   On: 05/18/2022 23:32   CT Angio Chest/Abd/Pel for Dissection W and/or Wo Contrast  Result Date: 05/18/2022 CLINICAL DATA:  Chest pain or back pain, aortic dissection suspected EXAM: CT ANGIOGRAPHY CHEST, ABDOMEN AND PELVIS TECHNIQUE: Non-contrast CT of the chest was initially obtained. Multidetector CT imaging through the chest, abdomen and pelvis was performed using the standard protocol during bolus administration of intravenous contrast. Multiplanar reconstructed images and MIPs were obtained and reviewed to evaluate the vascular anatomy. RADIATION DOSE REDUCTION: This exam was performed according to the departmental dose-optimization program which includes automated exposure control, adjustment of the mA and/or kV according to patient size and/or use of iterative reconstruction technique. CONTRAST:  8m OMNIPAQUE IOHEXOL 350 MG/ML SOLN COMPARISON:  04/28/2021 FINDINGS: CTA CHEST FINDINGS Cardiovascular: Preferential opacification of the thoracic aorta. No evidence of thoracic aortic aneurysm or dissection. Normal heart size. No pericardial effusion. Mediastinum/Nodes: No enlarged mediastinal, hilar, or axillary lymph nodes. Thyroid gland, trachea, and  esophagus demonstrate no significant findings. Lungs/Pleura: Lungs are clear. No pleural effusion or pneumothorax. Musculoskeletal: Chest wall soft tissues are unremarkable. No acute bony abnormality. Review of the MIP images confirms the above findings. CTA ABDOMEN AND PELVIS FINDINGS VASCULAR Aorta: Normal caliber aorta without aneurysm, dissection, vasculitis or significant stenosis. Celiac: Patent without evidence of aneurysm, dissection, vasculitis or significant stenosis. SMA: Patent without evidence of aneurysm, dissection, vasculitis or significant stenosis. Renals: Both renal arteries are patent without evidence of aneurysm, dissection, vasculitis, fibromuscular dysplasia or significant stenosis. IMA: Patent without evidence of aneurysm, dissection, vasculitis or significant stenosis. Inflow: Patent without evidence of aneurysm, dissection, vasculitis or significant stenosis. Veins: No obvious venous abnormality within the limitations of this arterial phase study. No  focal hepatic abnormality. Gallbladder unremarkable. Review of the MIP images confirms the above findings. NON-VASCULAR Hepatobiliary: No focal hepatic abnormality. Gallbladder unremarkable. Pancreas: No focal abnormality or ductal dilatation. Spleen: This No focal abnormality.  Normal size. Adrenals/Urinary Tract: No adrenal abnormality. No focal renal abnormality. No stones or hydronephrosis. Urinary bladder is unremarkable. Stomach/Bowel: Act Stomach, large and small bowel grossly unremarkable. Lymphatic: No adenopathy Reproductive: Uterus and left adnexa unremarkable. 3.8 cm cyst in the right ovary. Other: No free fluid or free air. Musculoskeletal: No acute bony abnormality. Review of the MIP images confirms the above findings. IMPRESSION: No evidence of aortic aneurysm or dissection. No acute findings in the chest, abdomen or pelvis. 3.8 cm right ovarian cyst. No follow-up imaging recommended. Note: This recommendation does not apply to  premenarchal patients and to those with increased risk (genetic, family history, elevated tumor markers or other high-risk factors) of ovarian cancer. Reference: JACR 2020 Feb; 17(2):248-254 Electronically Signed   By: Rolm Baptise M.D.   On: 05/18/2022 23:31   DG Chest 2 View  Result Date: 05/18/2022 CLINICAL DATA:  Chest pain EXAM: CHEST - 2 VIEW COMPARISON:  09/10/2021 FINDINGS: The heart size and mediastinal contours are within normal limits. Both lungs are clear. The visualized skeletal structures are unremarkable. IMPRESSION: No active cardiopulmonary disease. Electronically Signed   By: Donavan Foil M.D.   On: 05/18/2022 21:20     Data Reviewed: Relevant notes from primary care and specialist visits, past discharge summaries as available in EHR, including Care Everywhere. Prior diagnostic testing as pertinent to current admission diagnoses Updated medications and problem lists for reconciliation ED course, including vitals, labs, imaging, treatment and response to treatment Triage notes, nursing and pharmacy notes and ED provider's notes Notable results as noted in HPI   Assessment and Plan: * Chest pain History of MI s/p stent x2 Patient presents with typical chest pain but EKG nonacute.  Troponins negative x2 Received chewable aspirin Continue daily aspirin, metoprolol and atorvastatin.  Patient previously took Imdur.  Will apply Nitropaste tonight due to elevated blood pressure and ongoing chest pain Cardiology consult to decide on further diagnostic intervention We will keep n.p.o.  Melena Reports an episode of black tarry stool We will keep n.p.o. GI consult IV Protonix SCDs for DVT prophylaxis  Hypertensive urgency SBP over 200 in the ED.  History of noncompliance with meds in over 6 months Received amlodipine in the ED Resume home metoprolol.  Nitropaste was applied Patient states she checks her blood pressure regularly at home and it usually under  150/70         DVT prophylaxis: SCD  Consults: Cardiologist, Dr. Caryl Comes, GI Dr. Vicente Males  Advance Care Planning:   Code Status: Full Code   Family Communication: none  Disposition Plan: Back to previous home environment  Severity of Illness: The appropriate patient status for this patient is OBSERVATION. Observation status is judged to be reasonable and necessary in order to provide the required intensity of service to ensure the patient's safety. The patient's presenting symptoms, physical exam findings, and initial radiographic and laboratory data in the context of their medical condition is felt to place them at decreased risk for further clinical deterioration. Furthermore, it is anticipated that the patient will be medically stable for discharge from the hospital within 2 midnights of admission.   Author: Athena Masse, MD 05/19/2022 2:05 AM  For on call review [email or web address].

## 2022-05-19 NOTE — Progress Notes (Signed)
Patient unhooked themselves from telemetry box and did not want to be hooked back up. Notified RN in ED upon return from Nuclear Medicine.

## 2022-05-19 NOTE — Assessment & Plan Note (Addendum)
SBP over 200 in the ED.  History of noncompliance with meds in over 6 months Received amlodipine in the ED Resume home metoprolol.  Nitropaste was applied Patient states she checks her blood pressure regularly at home and it usually under 150/70

## 2022-05-19 NOTE — Progress Notes (Signed)
*  PRELIMINARY RESULTS* Echocardiogram 2D Echocardiogram has been performed.  Sherrie Sport 05/19/2022, 9:47 AM

## 2022-05-19 NOTE — Discharge Summary (Signed)
Physician Discharge Summary  [name] TDD:[id number] DOB: 1981-11-22 DOA: 05/18/2022  PCP: Patient, No Pcp Per (Inactive)  Admit date: 05/18/2022 Discharge date: 05/19/2022  Admitted From: home  Disposition:  home   Recommendations for Outpatient Follow-up:  Follow up with PCP in 1-2 weeks F/u w/ cardio, Dr. Nehemiah Massed, in 1-2 weeks   Home Health: no  Equipment/Devices:  Discharge Condition: stable  CODE STATUS: full  Diet recommendation: Heart Healthy  Brief/Interim Summary: HPI was taken from Dr. Damita Dunnings: Brenda Todd is a 41 y.o. female with medical history significant for CAD status post stent angioplasty with 2 stents, HTN, who relocated to [facility] from [place] 3 years prior and has yet to establish care with a cardiologist due to lack of healthcare insurance who presents to the ED with retrosternal chest pain radiating to the left arm, neck and back which she describes as an elephant sitting on her chest.  She had associated nausea and feels mildly short of breath.  She states that with her first MI she did not have chest pain but presented with a syncopal episode.  She has not taken any of her medications in over 6 months. She also reports having black tarry stools. ED course and data review: BP 220/114 on arrival with otherwise normal vitals Labs troponin 9-9, potassium 3.1, WBC 14.1, lipase and LFTs WNL EKG, personally viewed and interpreted with normal sinus rhythm with no acute ischemic changes. Chest x-ray with no active cardiopulmonary disease CTA chest abdomen and pelvis showing no evidence of aortic aneurysm or dissection and no acute findings in the chest abdomen and pelvis.  A 3.8 cm right ovarian cyst was noted with no follow-up imaging recommended. Patient was given a dose of amlodipine, chewable aspirin and morphine.  Hospitalist consulted for admission.   Cardio was consulted for chest pain and previous hx of MI s/p stents x 2. Cardiac stress test was done  which was normal/low risk. Pt will f/u outpatient w/ cardio, Dr. Nehemiah Massed, in 1-2 weeks. Of note, pt was initially thought to have melena. The black stools were secondary to the pt taking pepto-bismol. No EGD as per GI.    Discharge Diagnoses:  Principal Problem:   Chest pain Active Problems:   History of MI (myocardial infarction)   Hypertensive urgency   Melena  Chest pain: w / hx MI s/p stents x2. Troponins neg x 2. Continue on aspirin, metoprolol & statin. Cardiac stress was normal/low risk as per cardio.    R/o melena: black stools secondary to pt taking pepto-bismol. No EGD as per GI   Hypertensive urgency: w/ hx of noncompliance w/ meds x over 6 months. Continue on metoprolol. Urgency resolved but still w/ HTN.   Obesity: BMI 30.8. Would benefit from weight loss   Discharge Instructions  Discharge Instructions     Diet - low sodium heart healthy   Complete by: As directed    Discharge instructions   Complete by: As directed    F/u w/ cardio, Dr. Nehemiah Massed, in 1-2 weeks. F/u w/ PCP in 1-2 weeks   Increase activity slowly   Complete by: As directed       Allergies as of 05/19/2022       Reactions   No [employer] Food Allergies Anaphylaxis, Other (See Comments)   Cherries   Penicillins Anaphylaxis, Hives        Medication List     STOP taking these medications    predniSONE 10 MG (21) Tbpk tablet Commonly  known as: STERAPRED UNI-PAK 21 TAB       TAKE these medications    amLODipine 10 MG tablet Commonly known as: NORVASC Take 1 tablet (10 mg total) by mouth daily.   aspirin 81 MG chewable tablet Chew 1 tablet (81 mg total) by mouth daily.   atorvastatin 80 MG tablet Commonly known as: LIPITOR Take 1 tablet (80 mg total) by mouth daily.   budesonide 32 MCG/ACT nasal spray Commonly known as: EQ Budesonide Nasal Place 1 spray into both nostrils daily.   ibuprofen 200 MG tablet Commonly known as: ADVIL Take 400 mg by mouth every 6 (six) hours as  needed for mild pain, headache or fever.   isosorbide mononitrate 30 MG 24 hr tablet Commonly known as: IMDUR Take 1 tablet (30 mg total) by mouth daily.   levocetirizine 5 MG tablet Commonly known as: XYZAL Take 1 tablet (5 mg total) by mouth every evening.   metoprolol tartrate 25 MG tablet Commonly known as: LOPRESSOR Take 1 tablet (25 mg total) by mouth 2 (two) times daily.        Follow-up Information     Corey Skains, MD. Go in 1 week(s).   Specialty: Cardiology Contact information: [facility] [place] [address] [phone]                Allergies  Allergen Reactions   No [employer] Food Allergies Anaphylaxis and Other (See Comments)    Cherries   Penicillins Anaphylaxis and Hives    Consultations: Cardio  GI   Procedures/Studies: CT HEAD WO CONTRAST (5MM)  Result Date: 05/18/2022 CLINICAL DATA:  Neuro deficit, acute, stroke suspected EXAM: CT HEAD WITHOUT CONTRAST TECHNIQUE: Contiguous axial images were obtained from the base of the skull through the vertex without intravenous contrast. RADIATION DOSE REDUCTION: This exam was performed according to the departmental dose-optimization program which includes automated exposure control, adjustment of the mA and/or kV according to patient size and/or use of iterative reconstruction technique. COMPARISON:  None Available. FINDINGS: Brain: No acute intracranial abnormality. Specifically, no hemorrhage, hydrocephalus, mass lesion, acute infarction, or significant intracranial injury. Vascular: No hyperdense vessel or unexpected calcification. Skull: No acute calvarial abnormality. Sinuses/Orbits: No acute findings Other: None IMPRESSION: No acute intracranial abnormality. Electronically Signed   By: Rolm Baptise M.D.   On: 05/18/2022 23:32   CT Angio Chest/Abd/Pel for Dissection W and/or Wo Contrast  Result Date: 05/18/2022 CLINICAL DATA:  Chest pain or back  pain, aortic dissection suspected EXAM: CT ANGIOGRAPHY CHEST, ABDOMEN AND PELVIS TECHNIQUE: Non-contrast CT of the chest was initially obtained. Multidetector CT imaging through the chest, abdomen and pelvis was performed using the standard protocol during bolus administration of intravenous contrast. Multiplanar reconstructed images and MIPs were obtained and reviewed to evaluate the vascular anatomy. RADIATION DOSE REDUCTION: This exam was performed according to the departmental dose-optimization program which includes automated exposure control, adjustment of the mA and/or kV according to patient size and/or use of iterative reconstruction technique. CONTRAST:  37m OMNIPAQUE IOHEXOL 350 MG/ML SOLN COMPARISON:  04/28/2021 FINDINGS: CTA CHEST FINDINGS Cardiovascular: Preferential opacification of the thoracic aorta. No evidence of thoracic aortic aneurysm or dissection. Normal heart size. No pericardial effusion. Mediastinum/Nodes: No enlarged mediastinal, hilar, or axillary lymph nodes. Thyroid gland, trachea, and esophagus demonstrate no significant findings. Lungs/Pleura: Lungs are clear. No pleural effusion or pneumothorax. Musculoskeletal: Chest wall soft tissues are unremarkable. No acute bony abnormality. Review of the MIP images confirms the above findings. CTA  ABDOMEN AND PELVIS FINDINGS VASCULAR Aorta: Normal caliber aorta without aneurysm, dissection, vasculitis or significant stenosis. Celiac: Patent without evidence of aneurysm, dissection, vasculitis or significant stenosis. SMA: Patent without evidence of aneurysm, dissection, vasculitis or significant stenosis. Renals: Both renal arteries are patent without evidence of aneurysm, dissection, vasculitis, fibromuscular dysplasia or significant stenosis. IMA: Patent without evidence of aneurysm, dissection, vasculitis or significant stenosis. Inflow: Patent without evidence of aneurysm, dissection, vasculitis or significant stenosis. Veins: No obvious  venous abnormality within the limitations of this arterial phase study. No focal hepatic abnormality. Gallbladder unremarkable. Review of the MIP images confirms the above findings. NON-VASCULAR Hepatobiliary: No focal hepatic abnormality. Gallbladder unremarkable. Pancreas: No focal abnormality or ductal dilatation. Spleen: This No focal abnormality.  Normal size. Adrenals/Urinary Tract: No adrenal abnormality. No focal renal abnormality. No stones or hydronephrosis. Urinary bladder is unremarkable. Stomach/Bowel: Act Stomach, large and small bowel grossly unremarkable. Lymphatic: No adenopathy Reproductive: Uterus and left adnexa unremarkable. 3.8 cm cyst in the right ovary. Other: No free fluid or free air. Musculoskeletal: No acute bony abnormality. Review of the MIP images confirms the above findings. IMPRESSION: No evidence of aortic aneurysm or dissection. No acute findings in the chest, abdomen or pelvis. 3.8 cm right ovarian cyst. No follow-up imaging recommended. Note: This recommendation does not apply to premenarchal patients and to those with increased risk (genetic, family history, elevated tumor markers or other high-risk factors) of ovarian cancer. Reference: JACR 2020 Feb; 17(2):248-254 Electronically Signed   By: Rolm Baptise M.D.   On: 05/18/2022 23:31   DG Chest 2 View  Result Date: 05/18/2022 CLINICAL DATA:  Chest pain EXAM: CHEST - 2 VIEW COMPARISON:  09/10/2021 FINDINGS: The heart size and mediastinal contours are within normal limits. Both lungs are clear. The visualized skeletal structures are unremarkable. IMPRESSION: No active cardiopulmonary disease. Electronically Signed   By: Donavan Foil M.D.   On: 05/18/2022 21:20   (Echo, Carotid, EGD, Colonoscopy, ERCP)    Subjective: Pt c/o being hungry    Discharge Exam: Vitals:   05/19/22 1030 05/19/22 1438  BP: (!) 145/90 (!) 148/85  Pulse: 70 84  Resp:  17  Temp:  98 F (36.7 C)  SpO2: 97% 99%   Vitals:   05/19/22 0959  05/19/22 1000 05/19/22 1030 05/19/22 1438  BP: (!) 159/93 (!) 147/92 (!) 145/90 (!) 148/85  Pulse: 72 73 70 84  Resp: 20   17  Temp:    98 F (36.7 C)  TempSrc:    Oral  SpO2: 99% 98% 97% 99%  Weight:      Height:        General: Pt is alert, awake, not in acute distress Cardiovascular: S1/S2 +, no rubs, no gallops Respiratory: CTA bilaterally, no wheezing, no rhonchi Abdominal: Soft, NT, obese, bowel sounds + Extremities: no edema, no cyanosis    The results of significant diagnostics from this hospitalization (including imaging, microbiology, ancillary and laboratory) are listed below for reference.     Microbiology: No results found for this or any previous visit (from the past 240 hour(s)).   Labs: BNP (last 3 results) No results for input(s): "BNP" in the last 8760 hours. Basic Metabolic Panel: Recent Labs  Lab 05/18/22 2102  NA 137  K 3.1*  CL 104  CO2 24  GLUCOSE 95  BUN 12  CREATININE 0.62  CALCIUM 8.7*   Liver Function Tests: Recent Labs  Lab 05/18/22 2102  AST 16  ALT 18  ALKPHOS 47  BILITOT  0.6  PROT 7.0  ALBUMIN 3.9   Recent Labs  Lab 05/18/22 2102  LIPASE 31   No results for input(s): "AMMONIA" in the last 168 hours. CBC: Recent Labs  Lab 05/18/22 2102 05/19/22 0558  WBC 14.1*  --   HGB 13.9 12.5  HCT 42.7  --   MCV 80.7  --   PLT 270  --    Cardiac Enzymes: No results for input(s): "CKTOTAL", "CKMB", "CKMBINDEX", "TROPONINI" in the last 168 hours. BNP: Invalid input(s): "POCBNP" CBG: No results for input(s): "GLUCAP" in the last 168 hours. D-Dimer No results for input(s): "DDIMER" in the last 72 hours. Hgb A1c No results for input(s): "HGBA1C" in the last 72 hours. Lipid Profile No results for input(s): "CHOL", "HDL", "LDLCALC", "TRIG", "CHOLHDL", "LDLDIRECT" in the last 72 hours. Thyroid function studies No results for input(s): "TSH", "T4TOTAL", "T3FREE", "THYROIDAB" in the last 72 hours.  Invalid input(s):  "FREET3" Anemia work up No results for input(s): "VITAMINB12", "FOLATE", "FERRITIN", "TIBC", "IRON", "RETICCTPCT" in the last 72 hours. Urinalysis    Component Value Date/Time   COLORURINE YELLOW (A) 04/28/2021 2000   APPEARANCEUR CLOUDY (A) 04/28/2021 2000   APPEARANCEUR Cloudy 12/11/2014 1620   LABSPEC >1.046 (H) 04/28/2021 2000   LABSPEC 1.014 12/11/2014 1620   PHURINE 5.0 04/28/2021 2000   GLUCOSEU NEGATIVE 04/28/2021 2000   GLUCOSEU Negative 12/11/2014 1620   HGBUR LARGE (A) 04/28/2021 2000   BILIRUBINUR NEGATIVE 04/28/2021 2000   BILIRUBINUR Negative 12/11/2014 [facility] 04/28/2021 2000   PROTEINUR 30 (A) 04/28/2021 2000   UROBILINOGEN 1.0 04/05/2012 1807   NITRITE NEGATIVE 04/28/2021 2000   LEUKOCYTESUR MODERATE (A) 04/28/2021 2000   LEUKOCYTESUR Negative 12/11/2014 1620   Sepsis Labs Recent Labs  Lab 05/18/22 2102  WBC 14.1*   Microbiology No results found for this or any previous visit (from the past 240 hour(s)).   Time coordinating discharge: Over 30 minutes  SIGNED:   Wyvonnia Dusky, MD  [facility] 05/19/2022, 2:49 PM Pager   If 7PM-7AM, please contact night-coverage

## 2022-05-19 NOTE — Assessment & Plan Note (Addendum)
History of MI s/p stent x2 Patient presents with typical chest pain but EKG nonacute.  Troponins negative x2 Received chewable aspirin Continue daily aspirin, metoprolol and atorvastatin.  Patient previously took Imdur.  Will apply Nitropaste tonight due to elevated blood pressure and ongoing chest pain Cardiology consult to decide on further diagnostic intervention We will keep n.p.o.

## 2022-05-19 NOTE — Progress Notes (Signed)
Admission profile updated. ?

## 2022-05-20 LAB — ECHOCARDIOGRAM COMPLETE
AR max vel: 2.99 cm2
AV Area VTI: 3.36 cm2
AV Area mean vel: 3.18 cm2
AV Mean grad: 3 mmHg
AV Peak grad: 6.3 mmHg
Ao pk vel: 1.25 m/s
Area-P 1/2: 4.17 cm2
Height: 63 in
MV VTI: 2.52 cm2
S' Lateral: 2.7 cm
Weight: 2784 oz

## 2022-05-20 LAB — LIPOPROTEIN A (LPA): Lipoprotein (a): 273.9 nmol/L — ABNORMAL HIGH (ref <75.0)

## 2022-05-20 SURGERY — ESOPHAGOGASTRODUODENOSCOPY (EGD) WITH PROPOFOL
Anesthesia: General

## 2022-06-04 LAB — NM MYOCAR MULTI W/SPECT W/WALL MOTION / EF
Base ST Depression (mm): 0 mm
Estimated workload: 1
Exercise duration (min): 1 min
Exercise duration (sec): 58 s
LV dias vol: 106 mL (ref 46–106)
LV sys vol: 38 mL
MPHR: 179 {beats}/min
Nuc Stress EF: 64 %
Peak HR: 100 {beats}/min
Percent HR: 55 %
Rest HR: 73 {beats}/min
Rest Nuclear Isotope Dose: 9.9 mCi
SDS: 4
SRS: 17
SSS: 18
ST Depression (mm): 0 mm
Stress Nuclear Isotope Dose: 30.5 mCi
TID: 1.2

## 2022-06-27 ENCOUNTER — Other Ambulatory Visit: Payer: Self-pay

## 2022-06-27 ENCOUNTER — Emergency Department
Admission: EM | Admit: 2022-06-27 | Discharge: 2022-06-27 | Disposition: A | Discharge status: Home / Self Care | Payer: Self-pay | Attending: Emergency Medicine | Admitting: Emergency Medicine

## 2022-06-27 ENCOUNTER — Encounter: Payer: Self-pay | Admitting: Emergency Medicine

## 2022-06-27 DIAGNOSIS — N200 Calculus of kidney: Secondary | ICD-10-CM | POA: Insufficient documentation

## 2022-06-27 DIAGNOSIS — I10 Essential (primary) hypertension: Secondary | ICD-10-CM | POA: Insufficient documentation

## 2022-06-27 DIAGNOSIS — I2510 Atherosclerotic heart disease of native coronary artery without angina pectoris: Secondary | ICD-10-CM | POA: Insufficient documentation

## 2022-06-27 LAB — BASIC METABOLIC PANEL
Anion gap: 12 (ref 5–15)
BUN: 14 mg/dL (ref 6–20)
CO2: 25 mmol/L (ref 22–32)
Calcium: 9.5 mg/dL (ref 8.9–10.3)
Chloride: 101 mmol/L (ref 98–111)
Creatinine, Ser: 0.74 mg/dL (ref 0.44–1.00)
GFR, Estimated: >60 mL/min (ref >60)
Glucose, Bld: 99 mg/dL (ref 70–99)
Potassium: 3.6 mmol/L (ref 3.5–5.1)
Sodium: 138 mmol/L (ref 135–145)

## 2022-06-27 LAB — URINALYSIS, ROUTINE W REFLEX MICROSCOPIC
Bacteria, UA: NONE SEEN
Bilirubin Urine: NEGATIVE
Glucose, UA: NEGATIVE mg/dL
Ketones, ur: NEGATIVE mg/dL
Leukocytes,Ua: NEGATIVE
Nitrite: NEGATIVE
Protein, ur: NEGATIVE mg/dL
RBC / HPF: >50 RBC/hpf — ABNORMAL HIGH (ref 0–5)
Specific Gravity, Urine: 1.008 (ref 1.005–1.030)
pH: 6 (ref 5.0–8.0)

## 2022-06-27 LAB — CBC
HCT: 44.5 % (ref 36.0–46.0)
Hemoglobin: 14.4 g/dL (ref 12.0–15.0)
MCH: 26.2 pg (ref 26.0–34.0)
MCHC: 32.4 g/dL (ref 30.0–36.0)
MCV: 81.1 fL (ref 80.0–100.0)
Platelets: 265 10*3/uL (ref 150–400)
RBC: 5.49 MIL/uL — ABNORMAL HIGH (ref 3.87–5.11)
RDW: 14.1 % (ref 11.5–15.5)
WBC: 9 10*3/uL (ref 4.0–10.5)
nRBC: 0 % (ref 0.0–0.2)

## 2022-06-27 LAB — POC URINE PREG, ED: Preg Test, Ur: NEGATIVE

## 2022-06-27 MED ORDER — SODIUM CHLORIDE 0.9 % IV BOLUS
1000.0000 mL | Freq: Once | INTRAVENOUS | Status: AC
  Administered 2022-06-27: 1000 mL via INTRAVENOUS

## 2022-06-27 MED ORDER — OXYCODONE-ACETAMINOPHEN 5-325 MG PO TABS: 1.0000 | ORAL_TABLET | ORAL | 0 refills | Status: AC | PRN

## 2022-06-27 MED ORDER — KETOROLAC TROMETHAMINE 30 MG/ML IJ SOLN
30.0000 mg | Freq: Once | INTRAMUSCULAR | Status: AC
  Administered 2022-06-27: 30 mg via INTRAVENOUS
  Filled 2022-06-27: qty 1

## 2022-06-27 MED ORDER — IBUPROFEN 600 MG PO TABS: 600.0000 mg | ORAL_TABLET | Freq: Four times a day (QID) | ORAL | 0 refills | Status: DC | PRN

## 2022-06-27 MED ORDER — MORPHINE SULFATE (PF) 4 MG/ML IV SOLN
4.0000 mg | Freq: Once | INTRAVENOUS | Status: AC
  Administered 2022-06-27: 4 mg via INTRAVENOUS
  Filled 2022-06-27: qty 1

## 2022-06-27 MED ORDER — ONDANSETRON 4 MG PO TBDP
4.0000 mg | ORAL_TABLET | Freq: Once | ORAL | Status: AC
  Administered 2022-06-27: 4 mg via ORAL
  Filled 2022-06-27: qty 1

## 2022-06-27 MED ORDER — TAMSULOSIN HCL 0.4 MG PO CAPS
0.4000 mg | ORAL_CAPSULE | Freq: Once | ORAL | Status: AC
  Administered 2022-06-27: 0.4 mg via ORAL
  Filled 2022-06-27: qty 1

## 2022-06-27 MED ORDER — OXYCODONE-ACETAMINOPHEN 5-325 MG PO TABS
1.0000 | ORAL_TABLET | Freq: Once | ORAL | Status: AC
  Administered 2022-06-27: 1 via ORAL
  Filled 2022-06-27: qty 1

## 2022-06-27 MED ORDER — TAMSULOSIN HCL 0.4 MG PO CAPS: 0.4000 mg | ORAL_CAPSULE | Freq: Every day | ORAL | 0 refills | Status: DC

## 2022-06-27 MED ORDER — CLONIDINE HCL 0.1 MG PO TABS
0.2000 mg | ORAL_TABLET | Freq: Once | ORAL | Status: AC
  Administered 2022-06-27: 0.2 mg via ORAL
  Filled 2022-06-27: qty 2

## 2022-06-27 NOTE — ED Provider Triage Note (Signed)
Emergency Medicine Provider Triage Evaluation Note  TICIA VIRGO , a 41 y.o. female  was evaluated in triage.  Pt complains of right flank pain.  History of kidney stones symptoms began earlier today.  Positive nausea and vomiting.  Afebrile  Review of Systems  Positive: Right flank pain, nausea, vomiting Negative: Fevers  Physical Exam  BP (!) 230/130 (BP Location: Left Arm) Comment: taken twice, Gerald Stabs, PA aware  Pulse (!) 108   Temp 98.3 F (36.8 C) (Oral)   Resp 18   Ht '5\' 3"'$  (1.6 m)   Wt 78.5 kg   SpO2 100%   BMI 30.65 kg/m  Gen:   Awake, no distress   Resp:  Normal effort  MSK:   Moves extremities without difficulty  Other:    Medical Decision Making  Medically screening exam initiated at 5:49 PM.  Appropriate orders placed.  SHANTRICE RODENBERG was informed that the remainder of the evaluation will be completed by another provider, this initial triage assessment does not replace that evaluation, and the importance of remaining in the ED until their evaluation is complete.     Duanne Guess, [place] 06/27/22 1750

## 2022-06-27 NOTE — ED Notes (Signed)
Patient verbalizes understanding of discharge instructions. Opportunity for questioning and answers were provided. Armband removed by staff, pt discharged from ED to home via POV  

## 2022-06-27 NOTE — ED Provider Notes (Signed)
[facility] Provider Note    Event Date/Time   First MD Initiated Contact with Patient 06/27/22 2137     (approximate)   History   Flank Pain   HPI  Brenda Todd is a 41 y.o. female with a history of hypertension, CAD status post stents, and kidney stones who presents with right flank pain, cute onset this afternoon, associated with nausea and one episode of vomiting.  The patient reports difficulty initiating urination but denies dysuria or hematuria.  She has no fever or chills.  She has no diarrhea.  She states that the symptoms feel similar to prior kidney stones.  She has had to have a lithotripsy once before, but otherwise has passed stones on her own.  The patient states that she is on metoprolol for hypertension and took it today.  She denies any chest pain, severe headache, or difficulty breathing.    Physical Exam   Triage Vital Signs: ED Triage Vitals  Enc Vitals Group     BP 06/27/22 1746 (!) 230/130     Pulse Rate 06/27/22 1746 (!) 108     Resp 06/27/22 1746 18     Temp 06/27/22 1746 98.3 F (36.8 C)     Temp Source 06/27/22 1746 Oral     SpO2 06/27/22 1746 100 %     Weight 06/27/22 1743 173 lb (78.5 kg)     Height 06/27/22 1743 '5\' 3"'$  (1.6 m)     Head Circumference --      Peak Flow --      Pain Score 06/27/22 1743 10     Pain Loc --      Pain Edu? --      Excl. in [place]? --     Most recent vital signs: Vitals:   06/27/22 2300 06/27/22 2330  BP: (!) 205/118 (!) 195/110  Pulse: 73 73  Resp: 19 18  Temp:    SpO2: 100% 98%     General: Alert and oriented, uncomfortable appearing but in no acute distress. CV:  Good peripheral perfusion.  Resp:  Normal effort.  Abd:  Soft and nontender.  No distention.  Other:  Right CVA tenderness   ED Results / Procedures / Treatments   Labs (all labs ordered are listed, but only abnormal results are displayed) Labs Reviewed  URINALYSIS, ROUTINE W REFLEX MICROSCOPIC - Abnormal;  Notable for the following components:      Result Value   Color, Urine YELLOW (*)    APPearance HAZY (*)    Hgb urine dipstick LARGE (*)    RBC / HPF >50 (*)    All other components within normal limits  CBC - Abnormal; Notable for the following components:   RBC 5.49 (*)    All other components within normal limits  BASIC METABOLIC PANEL  POC URINE PREG, ED     EKG  ED ECG REPORT I, Arta Silence, the attending physician, personally viewed and interpreted this ECG.  Date: 06/27/2022 EKG Time: 2317 Rate: 75 Rhythm: normal sinus rhythm QRS Axis: normal Intervals: normal ST/T Wave abnormalities: LVH with no significant ST abnormalities Narrative Interpretation: no evidence of acute ischemia    RADIOLOGY    PROCEDURES:  Critical Care performed: No  Procedures   MEDICATIONS ORDERED IN ED: Medications  tamsulosin (FLOMAX) capsule 0.4 mg (has no administration in time range)  oxyCODONE-acetaminophen (PERCOCET/ROXICET) 5-325 MG per tablet 1 tablet (1 tablet Oral Given 06/27/22 1751)  ondansetron (ZOFRAN-ODT) disintegrating tablet 4  mg (4 mg Oral Given 06/27/22 1751)  sodium chloride 0.9 % bolus 1,000 mL (1,000 mLs Intravenous New Bag/Given 06/27/22 2218)  ketorolac (TORADOL) 30 MG/ML injection 30 mg (30 mg Intravenous Given 06/27/22 2216)  morphine (PF) 4 MG/ML injection 4 mg (4 mg Intravenous Given 06/27/22 2217)  cloNIDine (CATAPRES) tablet 0.2 mg (0.2 mg Oral Given 06/27/22 2234)     IMPRESSION / MDM / ASSESSMENT AND PLAN / ED COURSE  I reviewed the triage vital signs and the nursing notes.  41 year old female with PMH as noted above presents with acute onset of right flank pain which feels similar to prior ureteral stones.  She was also noted in triage to be significantly hypertensive.  I reviewed the past medical records.  Per the hospitalist discharge summary from 05/19/2022 the patient was admitted at that time with chest pain, although cardiac work-up was  negative.  On exam currently the patient has right CVA tenderness.  She is hypertensive although I think this is likely mainly due to her pain.  Her other vital signs are normal.  The physical exam is otherwise unremarkable.  Differential diagnosis includes, but is not limited to, ureteral stone, pyelonephritis, musculoskeletal pain.  Although the patient is hypertensive she has no chest pain, severe headache, or any symptoms of end organ dysfunction.  Patient's presentation is most consistent with acute presentation with potential threat to life or bodily function.  We will obtain lab work-up and reassess.  Since the patient has urinalysis showing RBCs, a normal creatinine, no leukocytosis, and pain identical to prior ureteral stones, there is no indication for repeat imaging at this time.  There is also no evidence of UTI/pyelonephritis.  Based on shared decision making with the patient she agrees with the plan to not obtain any imaging unless she has refractory pain in her to avoid unnecessary radiation exposure.  ----------------------------------------- 10:31 PM on 06/27/2022 -----------------------------------------  Blood pressure is still elevated despite analgesia so I have ordered p.o. clonidine.  ----------------------------------------- 11:48 PM on 06/27/2022 -----------------------------------------  Blood pressure somewhat improved.  The patient continues to be asymptomatic in terms of any hypertensive urgency.  Her EKG is nonischemic.  She states she feels much better and would like to go home.  She appears comfortable.  Given the well-controlled pain and otherwise reassuring findings on the work-up, she is stable for discharge without imaging today.  I have given her urology referral.  I prescribed ibuprofen, Percocet, and Flomax.  Return precautions given, she expresses understanding.   FINAL CLINICAL IMPRESSION(S) / ED DIAGNOSES   Final diagnoses:  Kidney stone      Rx / DC Orders   ED Discharge Orders          Ordered    oxyCODONE-acetaminophen (PERCOCET) 5-325 MG tablet  Every 4 hours PRN        06/27/22 2345    ibuprofen (ADVIL) 600 MG tablet  Every 6 hours PRN        06/27/22 2345    tamsulosin (FLOMAX) 0.4 MG CAPS capsule  Daily after supper        06/27/22 2345             Note:  This document was prepared using Dragon voice recognition software and may include unintentional dictation errors.    Arta Silence, MD 06/27/22 2350

## 2022-06-27 NOTE — Discharge Instructions (Addendum)
Take the ibuprofen every 6 hours (with food) as the primary pain medication, and the Percocet only if needed for breakthrough pain.  Take the Flomax nightly until you pass the stone.  We have given you referral to a local urologist to follow-up.  Return to the ER for new, worsening, or persistent severe pain, inability urinate, vomiting, fever, weakness, or any other new or worsening symptoms that concern you.

## 2022-06-27 NOTE — ED Triage Notes (Signed)
Patient to ED via POV for right sided flank pain. Patient states N/V and difficulty with urination. Hx of kidney stones. Patient states she took tramadol and ibuprofen approx 2 hours ago with no relief.

## 2022-06-28 ENCOUNTER — Telehealth: Payer: Self-pay | Admitting: Student in an Organized Health Care Education/Training Program

## 2022-06-28 MED ORDER — OXYCODONE-ACETAMINOPHEN 5-325 MG PO TABS: 1.0000 | ORAL_TABLET | ORAL | 0 refills | Status: DC | PRN

## 2022-06-28 NOTE — Telephone Encounter (Signed)
[facility] called and out of pain medication ordered yesterday.  Resent to [facility]

## 2022-11-11 ENCOUNTER — Emergency Department
Admission: EM | Admit: 2022-11-11 | Discharge: 2022-11-11 | Disposition: A | Discharge status: Home / Self Care | Payer: Self-pay | Attending: Emergency Medicine | Admitting: Emergency Medicine

## 2022-11-11 ENCOUNTER — Encounter: Payer: Self-pay | Admitting: Emergency Medicine

## 2022-11-11 ENCOUNTER — Other Ambulatory Visit: Payer: Self-pay

## 2022-11-11 ENCOUNTER — Emergency Department: Payer: Self-pay

## 2022-11-11 DIAGNOSIS — I2510 Atherosclerotic heart disease of native coronary artery without angina pectoris: Secondary | ICD-10-CM | POA: Insufficient documentation

## 2022-11-11 DIAGNOSIS — N12 Tubulo-interstitial nephritis, not specified as acute or chronic: Secondary | ICD-10-CM | POA: Insufficient documentation

## 2022-11-11 DIAGNOSIS — I10 Essential (primary) hypertension: Secondary | ICD-10-CM | POA: Insufficient documentation

## 2022-11-11 LAB — POC URINE PREG, ED: Preg Test, Ur: NEGATIVE

## 2022-11-11 LAB — URINALYSIS, ROUTINE W REFLEX MICROSCOPIC
Bilirubin Urine: NEGATIVE
Glucose, UA: NEGATIVE mg/dL
Ketones, ur: NEGATIVE mg/dL
Nitrite: POSITIVE — AB
Protein, ur: 100 mg/dL — AB
RBC / HPF: >50 RBC/hpf — ABNORMAL HIGH (ref 0–5)
Specific Gravity, Urine: 1.019 (ref 1.005–1.030)
WBC, UA: >50 WBC/hpf — ABNORMAL HIGH (ref 0–5)
pH: 5 (ref 5.0–8.0)

## 2022-11-11 LAB — BASIC METABOLIC PANEL
Anion gap: 8 (ref 5–15)
BUN: 14 mg/dL (ref 6–20)
CO2: 23 mmol/L (ref 22–32)
Calcium: 8.4 mg/dL — ABNORMAL LOW (ref 8.9–10.3)
Chloride: 107 mmol/L (ref 98–111)
Creatinine, Ser: 0.64 mg/dL (ref 0.44–1.00)
GFR, Estimated: >60 mL/min (ref >60)
Glucose, Bld: 92 mg/dL (ref 70–99)
Potassium: 3.7 mmol/L (ref 3.5–5.1)
Sodium: 138 mmol/L (ref 135–145)

## 2022-11-11 LAB — CBC WITH DIFFERENTIAL/PLATELET
Abs Immature Granulocytes: 0.05 10*3/uL (ref 0.00–0.07)
Basophils Absolute: 0.1 10*3/uL (ref 0.0–0.1)
Basophils Relative: 1 %
Eosinophils Absolute: 0.1 10*3/uL (ref 0.0–0.5)
Eosinophils Relative: 2 %
HCT: 42.2 % (ref 36.0–46.0)
Hemoglobin: 13.6 g/dL (ref 12.0–15.0)
Immature Granulocytes: 1 %
Lymphocytes Relative: 26 %
Lymphs Abs: 2.4 10*3/uL (ref 0.7–4.0)
MCH: 26.5 pg (ref 26.0–34.0)
MCHC: 32.2 g/dL (ref 30.0–36.0)
MCV: 82.3 fL (ref 80.0–100.0)
Monocytes Absolute: 0.6 10*3/uL (ref 0.1–1.0)
Monocytes Relative: 6 %
Neutro Abs: 6.1 10*3/uL (ref 1.7–7.7)
Neutrophils Relative %: 64 %
Platelets: 245 10*3/uL (ref 150–400)
RBC: 5.13 MIL/uL — ABNORMAL HIGH (ref 3.87–5.11)
RDW: 14 % (ref 11.5–15.5)
WBC: 9.4 10*3/uL (ref 4.0–10.5)
nRBC: 0 % (ref 0.0–0.2)

## 2022-11-11 MED ORDER — ONDANSETRON HCL 4 MG/2ML IJ SOLN
4.0000 mg | Freq: Once | INTRAMUSCULAR | Status: AC
  Administered 2022-11-11: 4 mg via INTRAVENOUS
  Filled 2022-11-11: qty 2

## 2022-11-11 MED ORDER — ONDANSETRON 4 MG PO TBDP
4.0000 mg | ORAL_TABLET | Freq: Once | ORAL | Status: AC
  Administered 2022-11-11: 4 mg via ORAL
  Filled 2022-11-11: qty 1

## 2022-11-11 MED ORDER — CIPROFLOXACIN IN D5W 400 MG/200ML IV SOLN
400.0000 mg | Freq: Once | INTRAVENOUS | Status: AC
  Administered 2022-11-11: 400 mg via INTRAVENOUS
  Filled 2022-11-11: qty 200

## 2022-11-11 MED ORDER — SODIUM CHLORIDE 0.9 % IV BOLUS
500.0000 mL | Freq: Once | INTRAVENOUS | Status: AC
  Administered 2022-11-11: 500 mL via INTRAVENOUS

## 2022-11-11 MED ORDER — ONDANSETRON 8 MG PO TBDP: 8.0000 mg | ORAL_TABLET | Freq: Three times a day (TID) | ORAL | 0 refills | Status: DC | PRN

## 2022-11-11 MED ORDER — OXYCODONE-ACETAMINOPHEN 5-325 MG PO TABS
1.0000 | ORAL_TABLET | Freq: Once | ORAL | Status: AC
  Administered 2022-11-11: 1 via ORAL
  Filled 2022-11-11: qty 1

## 2022-11-11 MED ORDER — OXYCODONE-ACETAMINOPHEN 5-325 MG PO TABS: 1.0000 | ORAL_TABLET | Freq: Four times a day (QID) | ORAL | 0 refills | Status: AC | PRN

## 2022-11-11 MED ORDER — HYDROMORPHONE HCL 1 MG/ML IJ SOLN
0.5000 mg | Freq: Once | INTRAMUSCULAR | Status: AC
  Administered 2022-11-11: 0.5 mg via INTRAVENOUS
  Filled 2022-11-11: qty 0.5

## 2022-11-11 MED ORDER — CIPROFLOXACIN HCL 500 MG PO TABS: 500.0000 mg | ORAL_TABLET | Freq: Two times a day (BID) | ORAL | 0 refills | Status: AC

## 2022-11-11 NOTE — ED Provider Triage Note (Signed)
Emergency Medicine Provider Triage Evaluation Note  LALANYA RUFENER, a 41 y.o. female  was evaluated in triage.  Pt complains of left-sided flank pain with a history of kidney stones.  She reports progressive symptoms since Tuesday.  She also reports some decreased urinary output.  Review of Systems  Positive: Urinary retention, flank pain  Negative: FCS  Physical Exam  BP (!) 207/117   Pulse 90   Temp 98.5 F (36.9 C) (Oral)   Resp 20   SpO2 95%  Gen:   Awake, no distress   Resp:  Normal effort  MSK:   Moves extremities without difficulty  ABD:  Right flank tenderness on exam  Medical Decision Making  Medically screening exam initiated at 2:32 PM.  Appropriate orders placed.  TARRIE MCMICHEN was informed that the remainder of the evaluation will be completed by another provider, this initial triage assessment does not replace that evaluation, and the importance of remaining in the ED until their evaluation is complete.  Patient with a history of kidney stones presents to the ED with acute left-sided flank pain and some urinary retention.   Melvenia Needles, PA-C 11/11/22 1433

## 2022-11-11 NOTE — Discharge Instructions (Addendum)
Take the antibiotic as prescribed and finish the full course.  Return to the ER for new, worsening, or persistent severe pain, nausea or vomiting, inability take the medication, fever, weakness, or any other new or worsening symptoms that concern you.  Follow-up with your regular doctor.

## 2022-11-11 NOTE — ED Triage Notes (Signed)
LEFT flank pain since Tuesday with decreased urine output; h/o kidney stones with obstruction and pain feels the same

## 2022-11-11 NOTE — ED Provider Notes (Signed)
[facility] Provider Note    Event Date/Time   First MD Initiated Contact with Patient 11/11/22 1637     (approximate)   History   Flank Pain (LEFT flank pain since Tuesday with decreased urine output; h/o kidney stones with obstruction and pain feels the same)   HPI  Brenda Todd is a 41 y.o. female with a history of hypertension, CAD, and kidney stones who presents with left flank pain for the last 2 days, persistent course, associated with multiple episodes of nausea and vomiting.  She states the pain feels similar to prior kidney stones.  It has somewhat migrated up from her groin to her flank.  She also reports dark urine, dysuria, and decreased urination today.  She denies any fever or chills.  I reviewed the past medical records.  I saw this patient in the ED on 7/23 at which time she was diagnosed with a ureteral stone.  The patient was most recently admitted in June and per the hospitalist discharge summary from 6/14 she presented with chest pain and had a reassuring stress test.   Physical Exam   Triage Vital Signs: ED Triage Vitals  Enc Vitals Group     BP 11/11/22 1419 (!) 207/117     Pulse Rate 11/11/22 1419 90     Resp 11/11/22 1419 20     Temp 11/11/22 1419 98.5 F (36.9 C)     Temp Source 11/11/22 1419 Oral     SpO2 11/11/22 1419 95 %     Weight 11/11/22 1724 173 lb 1 oz (78.5 kg)     Height 11/11/22 1724 '5\' 3"'$  (1.6 m)     Head Circumference --      Peak Flow --      Pain Score 11/11/22 1418 10     Pain Loc --      Pain Edu? --      Excl. in [place]? --     Most recent vital signs: Vitals:   11/11/22 1628 11/11/22 1852  BP: (!) 215/117 (!) 194/105  Pulse: 92 90  Resp: 18 18  Temp: 97.9 F (36.6 C) 98 F (36.7 C)  SpO2: 99% 99%     General: Awake, no distress.  CV:  Good peripheral perfusion.  Resp:  Normal effort.  Abd:  Soft and nontender.  No distention.  Other:  Left flank tenderness.   ED Results / Procedures /  Treatments   Labs (all labs ordered are listed, but only abnormal results are displayed) Labs Reviewed  URINALYSIS, ROUTINE W REFLEX MICROSCOPIC - Abnormal; Notable for the following components:      Result Value   Color, Urine YELLOW (*)    APPearance CLOUDY (*)    Hgb urine dipstick LARGE (*)    Protein, ur 100 (*)    Nitrite POSITIVE (*)    Leukocytes,Ua MODERATE (*)    RBC / HPF >50 (*)    WBC, UA >50 (*)    Bacteria, UA MANY (*)    All other components within normal limits  BASIC METABOLIC PANEL - Abnormal; Notable for the following components:   Calcium 8.4 (*)    All other components within normal limits  CBC WITH DIFFERENTIAL/PLATELET - Abnormal; Notable for the following components:   RBC 5.13 (*)    All other components within normal limits  POC URINE PREG, ED     EKG     RADIOLOGY  CT abdomen/pelvis: I independently viewed and interpreted  the images; there is no left ureteral stone, hydronephrosis, or hydroureter.  Radiology report indicates no acute abnormalities.   PROCEDURES:  Critical Care performed: No  Procedures   MEDICATIONS ORDERED IN ED: Medications  ondansetron (ZOFRAN-ODT) disintegrating tablet 4 mg (4 mg Oral Given 11/11/22 1428)  oxyCODONE-acetaminophen (PERCOCET/ROXICET) 5-325 MG per tablet 1 tablet (1 tablet Oral Given 11/11/22 1428)  HYDROmorphone (DILAUDID) injection 0.5 mg (0.5 mg Intravenous Given 11/11/22 1737)  sodium chloride 0.9 % bolus 500 mL (0 mLs Intravenous Stopped 11/11/22 1853)  ondansetron (ZOFRAN) injection 4 mg (4 mg Intravenous Given 11/11/22 1738)  ciprofloxacin (CIPRO) IVPB 400 mg (0 mg Intravenous Stopped 11/11/22 1853)     IMPRESSION / MDM / ASSESSMENT AND PLAN / ED COURSE  I reviewed the triage vital signs and the nursing notes.  41 year old female with PMH as noted above presents with 2 days of left groin and flank pain along with urinary symptoms.  Physical exam reveals mild left flank tenderness.  The patient is  also significantly hypertensive although this is most likely related to pain.  The patient has no chest pain, severe headache, or other symptoms of hypertensive crisis.  Urinalysis shows significant findings of nitrates, WBCs, RBCs, and bacteria consistent with infection.  CT shows a small left renal stone with no actual ureteral stones or evidence of obstruction.  Differential diagnosis includes, but is not limited to, UTI/pyonephritis.  There is no evidence of ureteral stone based on the CT.  There is no clinical evidence for diverticulitis or other nonurologic etiology.  We will obtain labs, give fluids, analgesia, and Zofran.  The patient is strongly allergic to penicillin so I will treat with IV Cipro.  Patient's presentation is most consistent with acute presentation with potential threat to life or bodily function.  ----------------------------------------- 7:12 PM on 11/11/2022 -----------------------------------------  Basic labs are unremarkable.  There is no leukocytosis, creatinine is normal, electrolytes are normal.  On reassessment, the patient states she is feeling significantly better with markedly improved pain.  She has been able to tolerate p.o.  She feels comfortable and would like to go home.  She was significantly hypertensive initially.  This was likely exacerbated by pain.  Blood pressure is improved at this time.  There is no evidence of hypertensive emergency.  Based on the patient's pyelonephritis and elevated blood pressure I did initially consider whether she may require admission, however given her ability to tolerate p.o., her improved vital signs, and overall reassuring clinical status, she is appropriate for discharge home, and she very much would like to go home.  I have prescribed Cipro given the patient's known penicillin anaphylaxis (she states that she likely cannot tolerate cephalosporins) as well as Zofran and some analgesia.  I counseled her on the results  of the workup and plan of care.  I gave strict return precautions and she expresses understanding.    FINAL CLINICAL IMPRESSION(S) / ED DIAGNOSES   Final diagnoses:  Pyelonephritis     Rx / DC Orders   ED Discharge Orders          Ordered    ciprofloxacin (CIPRO) 500 MG tablet  2 times daily        11/11/22 1911    ondansetron (ZOFRAN-ODT) 8 MG disintegrating tablet  Every 8 hours PRN        11/11/22 1911    oxyCODONE-acetaminophen (PERCOCET) 5-325 MG tablet  Every 6 hours PRN        11/11/22 1911  Note:  This document was prepared using Dragon voice recognition software and may include unintentional dictation errors.    Arta Silence, MD 11/11/22 [phone]

## 2023-02-27 ENCOUNTER — Emergency Department (HOSPITAL_COMMUNITY)
Admission: EM | Admit: 2023-02-27 | Discharge: 2023-02-27 | Disposition: A | Discharge status: Home / Self Care | Payer: Medicaid Other | Attending: Emergency Medicine | Admitting: Emergency Medicine

## 2023-02-27 ENCOUNTER — Encounter (HOSPITAL_COMMUNITY): Payer: Self-pay

## 2023-02-27 ENCOUNTER — Emergency Department (HOSPITAL_COMMUNITY): Payer: Medicaid Other

## 2023-02-27 DIAGNOSIS — R200 Anesthesia of skin: Secondary | ICD-10-CM | POA: Insufficient documentation

## 2023-02-27 DIAGNOSIS — I10 Essential (primary) hypertension: Secondary | ICD-10-CM | POA: Diagnosis not present

## 2023-02-27 DIAGNOSIS — R531 Weakness: Secondary | ICD-10-CM | POA: Diagnosis present

## 2023-02-27 DIAGNOSIS — Z7982 Long term (current) use of aspirin: Secondary | ICD-10-CM | POA: Insufficient documentation

## 2023-02-27 DIAGNOSIS — E876 Hypokalemia: Secondary | ICD-10-CM | POA: Insufficient documentation

## 2023-02-27 DIAGNOSIS — I2510 Atherosclerotic heart disease of native coronary artery without angina pectoris: Secondary | ICD-10-CM | POA: Diagnosis not present

## 2023-02-27 DIAGNOSIS — D72829 Elevated white blood cell count, unspecified: Secondary | ICD-10-CM | POA: Diagnosis not present

## 2023-02-27 LAB — I-STAT CHEM 8, ED
BUN: 12 mg/dL (ref 6–20)
Calcium, Ion: 1.14 mmol/L — ABNORMAL LOW (ref 1.15–1.40)
Chloride: 102 mmol/L (ref 98–111)
Creatinine, Ser: 0.7 mg/dL (ref 0.44–1.00)
Glucose, Bld: 109 mg/dL — ABNORMAL HIGH (ref 70–99)
HCT: 42 % (ref 36.0–46.0)
Hemoglobin: 14.3 g/dL (ref 12.0–15.0)
Potassium: 3.3 mmol/L — ABNORMAL LOW (ref 3.5–5.1)
Sodium: 142 mmol/L (ref 135–145)
TCO2: 29 mmol/L (ref 22–32)

## 2023-02-27 LAB — RAPID URINE DRUG SCREEN, HOSP PERFORMED
Amphetamines: NOT DETECTED
Barbiturates: NOT DETECTED
Benzodiazepines: NOT DETECTED
Cocaine: NOT DETECTED
Opiates: NOT DETECTED
Tetrahydrocannabinol: POSITIVE — AB

## 2023-02-27 LAB — APTT: aPTT: 26 seconds (ref 24–36)

## 2023-02-27 LAB — DIFFERENTIAL
Abs Immature Granulocytes: 0.03 10*3/uL (ref 0.00–0.07)
Basophils Absolute: 0.1 10*3/uL (ref 0.0–0.1)
Basophils Relative: 1 %
Eosinophils Absolute: 0.1 10*3/uL (ref 0.0–0.5)
Eosinophils Relative: 1 %
Immature Granulocytes: 0 %
Lymphocytes Relative: 27 %
Lymphs Abs: 3.1 10*3/uL (ref 0.7–4.0)
Monocytes Absolute: 0.7 10*3/uL (ref 0.1–1.0)
Monocytes Relative: 6 %
Neutro Abs: 7.5 10*3/uL (ref 1.7–7.7)
Neutrophils Relative %: 65 %

## 2023-02-27 LAB — PROTIME-INR
INR: 1 (ref 0.8–1.2)
Prothrombin Time: 12.7 seconds (ref 11.4–15.2)

## 2023-02-27 LAB — CBC
HCT: 42.8 % (ref 36.0–46.0)
Hemoglobin: 14.3 g/dL (ref 12.0–15.0)
MCH: 26.9 pg (ref 26.0–34.0)
MCHC: 33.4 g/dL (ref 30.0–36.0)
MCV: 80.6 fL (ref 80.0–100.0)
Platelets: 260 10*3/uL (ref 150–400)
RBC: 5.31 MIL/uL — ABNORMAL HIGH (ref 3.87–5.11)
RDW: 13.9 % (ref 11.5–15.5)
WBC: 11.6 10*3/uL — ABNORMAL HIGH (ref 4.0–10.5)
nRBC: 0 % (ref 0.0–0.2)

## 2023-02-27 LAB — COMPREHENSIVE METABOLIC PANEL
ALT: 15 U/L (ref 0–44)
AST: 14 U/L — ABNORMAL LOW (ref 15–41)
Albumin: 3.6 g/dL (ref 3.5–5.0)
Alkaline Phosphatase: 50 U/L (ref 38–126)
Anion gap: 8 (ref 5–15)
BUN: 12 mg/dL (ref 6–20)
CO2: 26 mmol/L (ref 22–32)
Calcium: 8.9 mg/dL (ref 8.9–10.3)
Chloride: 105 mmol/L (ref 98–111)
Creatinine, Ser: 0.75 mg/dL (ref 0.44–1.00)
GFR, Estimated: >60 mL/min (ref >60)
Glucose, Bld: 108 mg/dL — ABNORMAL HIGH (ref 70–99)
Potassium: 3.2 mmol/L — ABNORMAL LOW (ref 3.5–5.1)
Sodium: 139 mmol/L (ref 135–145)
Total Bilirubin: 0.5 mg/dL (ref 0.3–1.2)
Total Protein: 6.5 g/dL (ref 6.5–8.1)

## 2023-02-27 LAB — URINALYSIS, ROUTINE W REFLEX MICROSCOPIC
Bilirubin Urine: NEGATIVE
Glucose, UA: NEGATIVE mg/dL
Hgb urine dipstick: NEGATIVE
Ketones, ur: NEGATIVE mg/dL
Leukocytes,Ua: NEGATIVE
Nitrite: NEGATIVE
Protein, ur: NEGATIVE mg/dL
Specific Gravity, Urine: 1.012 (ref 1.005–1.030)
pH: 7 (ref 5.0–8.0)

## 2023-02-27 LAB — I-STAT BETA HCG BLOOD, ED (MC, WL, AP ONLY): I-stat hCG, quantitative: <5 m[IU]/mL (ref <5)

## 2023-02-27 LAB — TROPONIN I (HIGH SENSITIVITY): Troponin I (High Sensitivity): 9 ng/L (ref <18)

## 2023-02-27 LAB — ETHANOL: Alcohol, Ethyl (B): <10 mg/dL (ref <10)

## 2023-02-27 MED ORDER — METOCLOPRAMIDE HCL 5 MG/ML IJ SOLN
10.0000 mg | Freq: Once | INTRAMUSCULAR | Status: AC
  Administered 2023-02-27: 10 mg via INTRAVENOUS
  Filled 2023-02-27: qty 2

## 2023-02-27 MED ORDER — METOCLOPRAMIDE HCL 10 MG PO TABS: 10.0000 mg | ORAL_TABLET | Freq: Four times a day (QID) | ORAL | 0 refills | Status: DC

## 2023-02-27 MED ORDER — AMLODIPINE BESYLATE 5 MG PO TABS
10.0000 mg | ORAL_TABLET | Freq: Once | ORAL | Status: AC
  Administered 2023-02-27: 10 mg via ORAL
  Filled 2023-02-27: qty 2

## 2023-02-27 MED ORDER — MAGNESIUM SULFATE IN D5W 1-5 GM/100ML-% IV SOLN
1.0000 g | Freq: Once | INTRAVENOUS | Status: AC
  Administered 2023-02-27: 1 g via INTRAVENOUS
  Filled 2023-02-27: qty 100

## 2023-02-27 MED ORDER — LORAZEPAM 2 MG/ML IJ SOLN
1.0000 mg | Freq: Once | INTRAMUSCULAR | Status: AC
  Administered 2023-02-27: 1 mg via INTRAVENOUS
  Filled 2023-02-27: qty 1

## 2023-02-27 MED ORDER — DIPHENHYDRAMINE HCL 50 MG/ML IJ SOLN
12.5000 mg | Freq: Once | INTRAMUSCULAR | Status: AC
  Administered 2023-02-27: 12.5 mg via INTRAVENOUS
  Filled 2023-02-27: qty 1

## 2023-02-27 MED ORDER — AMLODIPINE BESYLATE 10 MG PO TABS: 10.0000 mg | ORAL_TABLET | Freq: Every day | ORAL | 0 refills | Status: DC

## 2023-02-27 MED ORDER — LACTATED RINGERS IV BOLUS
1000.0000 mL | Freq: Once | INTRAVENOUS | Status: AC
  Administered 2023-02-27: 1000 mL via INTRAVENOUS

## 2023-02-27 NOTE — Consult Note (Signed)
Neurology Consultation Reason for Consult: Left-sided weakness Referring Physician: Roxanne Mins, D  CC: Left-sided weakness  History is obtained from: Patient  HPI: Brenda Todd is a 42 y.o. female patient with a history of hypertension and myocardial infarction who presents with left-sided weakness.  She states that she was reading her book earlier today when suddenly fell out of her hand.  She called EMS who activated her as a code stroke.  She was brought emergently to CT which was negative.  On exam, there were multiple findings consistent with nonorganic etiology, however of concern for possible real disease with embellishment I did order an MRI which was performed which did not demonstrate any stroke.  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  She currently does have a photophobic headache, which is left-sided retro-orbital in location.  LKW: 1:40 AM tnk given?: no, not a stroke  Past Medical History:  Diagnosis Date   Chronic hypertension    Kidney calculi    MI (myocardial infarction) ([facility])    UTI (lower urinary tract infection)      History reviewed. No pertinent family history.   Social History:  reports that she has been smoking cigarettes. She has been smoking an average of .25 packs per day. She has never used smokeless tobacco. She reports current drug use. Drug: Marijuana. She reports that she does not drink alcohol.   Exam: Current vital signs: BP (!) 205/122   Pulse 74   Temp 98.1 F (36.7 C)   Resp 13   SpO2 95%  Vital signs in last 24 hours: Temp:  [98.1 F (36.7 C)] 98.1 F (36.7 C) (03/24 0259) Pulse Rate:  [74-82] 74 (03/24 0430) Resp:  [13-22] 13 (03/24 0430) BP: (170-205)/(122-127) 205/122 (03/24 0430) SpO2:  [95 %] 95 % (03/24 0430)   Physical Exam  Appears well-developed and well-nourished.   Neuro: Mental Status: Patient is awake, alert, oriented to person,  place, month, year, and situation. Patient is able to give a clear and coherent history. No signs of aphasia or neglect Cranial Nerves: II: Visual Fields are full. Pupils are equal, round, and reactive to light.   III,IV, VI: EOMI without ptosis or diploplia.  V: Facial sensation is markedly diminished in the left side, she splits midline to vibration on the forehead. VII: Facial movement with inconsistent left facial weakness VIII: hearing is intact to voice X: Uvula elevates symmetrically XII: tongue is midline without atrophy or fasciculations.  Motor: She has a markedly inconsistent left hemiparesis.  Initially giving left flaccid leg, but when distracted, she does hold her leg aloft against gravity. Sensory: Sensation is diminished throughout the left side Cerebellar: Does not perform   I have reviewed labs in epic and the results pertinent to this consultation are: CMP-unremarkable  I have reviewed the images obtained: CT/MRI-negative  Impression: 42 year old female with inconsistent left-sided weakness.  Her exam findings are mostly consistent with nonorganic disease, however given her history of migraine with aura, difficulty rule out some contribution of this.  Recommendations: 1) could consider treating as complicated migraine 2) no further neurodiagnostic testing needed at this time.   Roland Rack, MD [facility] [phone]  If 7pm- 7am, please page neurology on call as listed in [place].

## 2023-02-27 NOTE — ED Provider Notes (Addendum)
8:27 AM informed by RN that patient's mother-in-law was concerned about her continued left-sided weakness.  Reported to bedside. Patient presented to the ED as a code stroke. MRI negative for CVA. Performed full neurological exam. Patient's exam inconsistent on left side, first time had some decreased left grip strength then it resolved. Patient ambulated in the ED. Discussed with Dr. Rory Percy who notes patient is stable for discharge given normal work-up. Low suspicion for TIA. Low suspicion for CVA given normal MRI. BP elevated, but appears lower than previous. Advised patient to take HTN medications when she gets home. Low suspicion for hypertensive emergency/urgency. Discharge paperwork per previous provider.   8:43 AM mother in law at bedside concerned about BP. BP improved from previous. Rechecked at bedside 170/130. Patient previously prescribed amlodipine, but MIL notes patient does not have prescription. Patient given first dose here in the ED and prescription sent. Strict ED precautions discussed with patient. Patient states understanding and agrees to plan. Patient discharged home in no acute distress and stable vitals      Karie Kirks 02/27/23 0845    Blanchie Dessert, MD 02/27/23 1410

## 2023-02-27 NOTE — ED Provider Notes (Signed)
[facility] Provider Note   CSN: [id number] Arrival date & time: 02/27/23  [id number]  An emergency department physician performed an initial assessment on this suspected stroke patient at 0218.  History  Chief Complaint  Patient presents with   Code Stroke    Brenda Todd is a 42 y.o. female.  HPI Patient is a 42 year old female with past medical history significant for CAD status post DES.   Patient presents emergency room today with complaints of left-sided weakness of upper and lower extremity.  She states that she was reading a book at 1:30 AM when she dropped a book because she was weak in her left arm.  911 was called and EMS activated code stroke on arrival.  She was brought to [facility] emergency department.  She denies any nausea or vomiting but does indicate that she has some chest discomfort currently.  She indicates that she has also had some sensory changes in her left upper and lower extremity however these seem to have resolved at the time of my evaluation.  She tells me that she has a history of migraine headaches as visual auras associated with these.  She is never had any numbness or weakness associate with them however.  She tells me that she is very light sensitive currently which is worsening her headaches.  She states her chest pain is sternal nonradiating and nonpleuritic.  No recent surgeries, hospitalization, long travel, hemoptysis, estrogen containing OCP, cancer history.  No unilateral leg swelling.  No history of PE or VTE.     Home Medications Prior to Admission medications   Medication Sig Start Date End Date Taking? Authorizing Provider  amLODipine (NORVASC) 10 MG tablet Take 1 tablet (10 mg total) by mouth daily. Patient not taking: Reported on 05/19/2022 01/05/22   Hazel Sams, PA-C  aspirin 81 MG chewable tablet Chew 1 tablet (81 mg total) by mouth daily. 04/29/21   Wouk, Ailene Rud, MD  atorvastatin  (LIPITOR) 80 MG tablet Take 1 tablet (80 mg total) by mouth daily. Patient not taking: Reported on 05/19/2022 04/29/21 05/29/21  Gwynne Edinger, MD  budesonide (EQ BUDESONIDE NASAL) 32 MCG/ACT nasal spray Place 1 spray into both nostrils daily. Patient not taking: Reported on 05/19/2022 02/05/22   Geryl Councilman L, PA  ibuprofen (ADVIL) 600 MG tablet Take 1 tablet (600 mg total) by mouth every 6 (six) hours as needed. 06/27/22   Arta Silence, MD  isosorbide mononitrate (IMDUR) 30 MG 24 hr tablet Take 1 tablet (30 mg total) by mouth daily. Patient not taking: Reported on 05/19/2022 04/29/21 05/29/21  Gwynne Edinger, MD  levocetirizine (XYZAL) 5 MG tablet Take 1 tablet (5 mg total) by mouth every evening. Patient not taking: Reported on 05/19/2022 02/05/22   Geryl Councilman L, PA  metoprolol tartrate (LOPRESSOR) 25 MG tablet Take 1 tablet (25 mg total) by mouth 2 (two) times daily. Patient not taking: Reported on 05/19/2022 04/29/21   Gwynne Edinger, MD  ondansetron (ZOFRAN-ODT) 8 MG disintegrating tablet Take 1 tablet (8 mg total) by mouth every 8 (eight) hours as needed for nausea or vomiting. 11/11/22   Arta Silence, MD  tamsulosin (FLOMAX) 0.4 MG CAPS capsule Take 1 capsule (0.4 mg total) by mouth daily after supper. 06/27/22   Arta Silence, MD      Allergies    No healthtouch food allergies, Penicillins, and Reglan [metoclopramide]    Review of Systems   Review of Systems  Physical Exam Updated Vital Signs BP (!) 154/80   Pulse 78   Temp 98.1 F (36.7 C)   Resp (!) 24   SpO2 95%  Physical Exam Vitals and nursing note reviewed.  Constitutional:      General: She is not in acute distress. HENT:     Head: Normocephalic and atraumatic.     Nose: Nose normal.  Eyes:     General: No scleral icterus. Cardiovascular:     Rate and Rhythm: Normal rate and regular rhythm.     Pulses: Normal pulses.     Heart sounds: Normal heart sounds.  Pulmonary:     Effort:  Pulmonary effort is normal. No respiratory distress.     Breath sounds: No wheezing.  Abdominal:     Palpations: Abdomen is soft.     Tenderness: There is no abdominal tenderness.  Musculoskeletal:     Cervical back: Normal range of motion.     Right lower leg: No edema.     Left lower leg: No edema.  Skin:    General: Skin is warm and dry.     Capillary Refill: Capillary refill takes less than 2 seconds.  Neurological:     Mental Status: She is alert. Mental status is at baseline.     Comments: Left arm is flaccid at shoulder and elbow initially however with motivation she is able to lift it against gravity and hold but will intermittently drop it completely and become flaccid left arm again.  Left lower extremity with 4/5 strength with flexion at hip and knee.  Bilateral grip strength is currently 5/5  Sensory exam is normal.  Smile symmetric  Psychiatric:        Mood and Affect: Mood normal.     Comments: Somewhat bizarre behavior, repeatedly holding her left arm with her right hand and dropping it  When blood pressure cuff is inflating patient repeatedly clamps her arm and writhes around in discomfort.     ED Results / Procedures / Treatments   Labs (all labs ordered are listed, but only abnormal results are displayed) Labs Reviewed  CBC - Abnormal; Notable for the following components:      Result Value   WBC 11.6 (*)    RBC 5.31 (*)    All other components within normal limits  COMPREHENSIVE METABOLIC PANEL - Abnormal; Notable for the following components:   Potassium 3.2 (*)    Glucose, Bld 108 (*)    AST 14 (*)    All other components within normal limits  RAPID URINE DRUG SCREEN, HOSP PERFORMED - Abnormal; Notable for the following components:   Tetrahydrocannabinol POSITIVE (*)    All other components within normal limits  URINALYSIS, ROUTINE W REFLEX MICROSCOPIC - Abnormal; Notable for the following components:   APPearance CLOUDY (*)    All other components  within normal limits  I-STAT CHEM 8, ED - Abnormal; Notable for the following components:   Potassium 3.3 (*)    Glucose, Bld 109 (*)    Calcium, Ion 1.14 (*)    All other components within normal limits  ETHANOL  PROTIME-INR  APTT  DIFFERENTIAL  I-STAT BETA HCG BLOOD, ED ([facility], [facility], [facility] ONLY)  TROPONIN I (HIGH SENSITIVITY)    EKG EKG Interpretation  Date/Time:  Sunday February 27 2023 02:58:32 EDT Ventricular Rate:  75 PR Interval:  147 QRS Duration: 89 QT Interval:  383 QTC Calculation: 428 R Axis:   79 Text Interpretation: Sinus rhythm Probable left atrial  enlargement Probable left ventricular hypertrophy When compared with ECG of 06/27/2022, No significant change was found Confirmed by Delora Fuel ([id number]) on 02/27/2023 4:16:55 AM  Radiology MR BRAIN WO CONTRAST  Result Date: 02/27/2023 CLINICAL DATA:  Initial evaluation for neuro deficit, stroke. EXAM: MRI HEAD WITHOUT CONTRAST TECHNIQUE: Multiplanar, multiecho pulse sequences of the brain and surrounding structures were obtained without intravenous contrast. COMPARISON:  Prior CT from earlier the same day. FINDINGS: Brain: This is a limited examination with axial DWI sequence only performed. Provided images are degraded by motion artifact. Diffusion-weighted imaging demonstrates no definite or convincing foci of acute or subacute ischemia. Note made of an apparent punctate focus of mild diffusion signal abnormality at the right thalamus (series 5, image 80), not convincingly seen on corresponding ADC correlate, and favored to be artifactual. Gray-white matter differentiation grossly maintained. No visible areas of chronic cortical infarction. No visible mass lesion, mass effect or midline shift. No hydrocephalus or extra-axial fluid collection. Vascular: Not assessed on this limited exam. Skull and upper cervical spine: Not assessed on this limited exam. Sinuses/Orbits: Not assessed on this limited exam. Other: None. IMPRESSION: 1. Motion  degraded exam. 2. No definite acute intracranial infarct or other abnormality. Electronically Signed   By: Jeannine Boga M.D.   On: 02/27/2023 03:06   CT HEAD CODE STROKE WO CONTRAST  Result Date: 02/27/2023 CLINICAL DATA:  Code stroke. Initial evaluation for neuro deficit, stroke. EXAM: CT HEAD WITHOUT CONTRAST TECHNIQUE: Contiguous axial images were obtained from the base of the skull through the vertex without intravenous contrast. RADIATION DOSE REDUCTION: This exam was performed according to the departmental dose-optimization program which includes automated exposure control, adjustment of the mA and/or kV according to patient size and/or use of iterative reconstruction technique. COMPARISON:  Prior CT from 05/18/2022. FINDINGS: Brain: Cerebral volume within normal limits. No acute intracranial hemorrhage. No acute large vessel territory infarct. No mass lesion or midline shift. No hydrocephalus or extra-axial fluid collection. Vascular: No convincing asymmetric hyperdense vessel. Skull: Scalp soft tissues and calvarium within normal limits. Sinuses/Orbits: Globes normal soft tissues within normal limits. Small amount of secretions noted within the left sphenoid sinus. Paranasal sinuses are otherwise clear. No mastoid effusion. Other: None. ASPECTS [facility] Stroke Program Early CT Score) - Ganglionic level infarction (caudate, lentiform nuclei, internal capsule, insula, M1-M3 cortex): 7 - Supraganglionic infarction (M4-M6 cortex): 3 Total score (0-10 with 10 being normal): 10 IMPRESSION: 1. Negative head CT.  No acute intracranial abnormality. 2. ASPECTS is 10. These results were communicated to Dr. Leonel Ramsay at 2:51 am on 02/27/2023 by text page via the [facility] messaging system. Electronically Signed   By: Jeannine Boga M.D.   On: 02/27/2023 02:51    Procedures Procedures    Medications Ordered in ED Medications  lactated ringers bolus 1,000 mL (0 mLs Intravenous Stopped 02/27/23 0610)   metoCLOPramide (REGLAN) injection 10 mg (10 mg Intravenous Given 02/27/23 0310)  magnesium sulfate IVPB 1 g 100 mL (0 g Intravenous Stopped 02/27/23 0443)  diphenhydrAMINE (BENADRYL) injection 12.5 mg (12.5 mg Intravenous Given 02/27/23 0309)  LORazepam (ATIVAN) injection 1 mg (1 mg Intravenous Given 02/27/23 0329)  LORazepam (ATIVAN) injection 1 mg (1 mg Intravenous Given 02/27/23 [id number])    ED Course/ Medical Decision Making/ A&P Clinical Course as of 02/27/23 0728  Sun Feb 27, 2023  0249 LKW 2am Airway intact. Taken to CT -- neg for bleed.  MRI given pt's risk factors (CAD w stents).  [WF]  [id number] MR BRAIN WO CONTRAST  MRI negative for acute stroke [WF]    Clinical Course User Index [WF] Tedd Sias, [place]                             Medical Decision Making Amount and/or Complexity of Data Reviewed Radiology:  Decision-making details documented in ED Course.  Risk Prescription drug management.   This patient presents to the ED for concern of left upper/lower extremity weakness, this involves a number of treatment options, and is a complaint that carries with it a moderate to high risk of complications and morbidity. A differential diagnosis was considered for the patient's symptoms which is discussed below:   Considered stroke, spinal cord injury, MS, psychogenic causes, complex migraine   Co morbidities: Discussed in HPI   Brief History:  Patient is a 42 year old female with past medical history significant for CAD status post DES.   Patient presents emergency room today with complaints of left-sided weakness of upper and lower extremity.  She states that she was reading a book at 1:30 AM when she dropped a book because she was weak in her left arm.  911 was called and EMS activated code stroke on arrival.  She was brought to [facility] emergency department.  She denies any nausea or vomiting but does indicate that she has some chest discomfort currently.  She indicates that  she has also had some sensory changes in her left upper and lower extremity however these seem to have resolved at the time of my evaluation.  She tells me that she has a history of migraine headaches as visual auras associated with these.  She is never had any numbness or weakness associate with them however.  She tells me that she is very light sensitive currently which is worsening her headaches.  She states her chest pain is sternal nonradiating and nonpleuritic.  No recent surgeries, hospitalization, long travel, hemoptysis, estrogen containing OCP, cancer history.  No unilateral leg swelling.  No history of PE or VTE.     EMR reviewed including pt PMHx, past surgical history and past visits to ER.   See HPI for more details   Lab Tests:  I personally reviewed all laboratory work and imaging. Metabolic panel without any acute abnormality specifically kidney function within normal limits and no significant electrolyte abnormalities. CBC without leukocytosis or significant anemia.  CBC mild leukocytosis and erythrocytosis  CMP mild hypoK - LR given UA nml UDS w THC Istat hcg neg  Imaging Studies:  NAD. I personally reviewed all imaging studies and no acute abnormality found. I agree with radiology interpretation. Negative MRI brain and head CT   Cardiac Monitoring:  The patient was maintained on a cardiac monitor.  I personally viewed and interpreted the cardiac monitored which showed an underlying rhythm of: NSR EKG non-ischemic   Medicines ordered:  I ordered medication including LR 1L, magnesium, reglan, benadryl, ativan  Reevaluation of the patient after these medicines showed that the patient improved I have reviewed the patients home medicines and have made adjustments as needed   Critical Interventions:     Consults/Attending Physician   I requested consultation with Dr. Saralyn Pilar,  and discussed lab and imaging findings as well as pertinent plan - they  recommend: MRI brain if normal treat as complex migraine  I discussed this case with my attending physician who cosigned this note including patient's presenting symptoms, physical exam, and planned diagnostics and interventions. Attending physician  stated agreement with plan or made changes to plan which were implemented.   Attending physician assessed patient at bedside.   Reevaluation:  After the interventions noted above I re-evaluated patient and found that they have :resolved   Social Determinants of Health:      Problem List / ED Course:  Patient presents emergency room today with left-sided weakness of left upper and left lower extremity.  On initial evaluation.  CT room patient had flaccid left upper extremity but had very inconsistent exam with neurology.  On my evaluation patient remains very inconsistent she is not moving her left leg without any difficulty however she is at times flaccid left upper extremity and at times has nearly normal strength and certainly antigravity.  Normal sensation.  She is acting very strangely definite for fracture, measure her blood pressure on either arm.  She did complain of a headache earlier however had resolution of headache with Reglan Benadryl and magnesium.  She did become very anxious and have a reaction to the Reglan consistent with a dystonic reaction. On reassessment patient had complete resolution of the symptoms.  He is neurologically intact now.  Will discharge home to follow-up with neurology.  MRI brain CTA head unremarkable. I ultimately suspect that there may be a psychiatric component today for presentation although complex migraine could certainly presents with the symptoms for behavior described strange during the majority of her ER visit.  With complete resolution of her symptoms I feel reassured that she can be discharged home to follow-up outpatient with neurology and strict return precautions emergency   Dispostion:  After  consideration of the diagnostic results and the patients response to treatment, I feel that the patent would benefit from outpatient follow-up   Final Clinical Impression(s) / ED Diagnoses Final diagnoses:  Left-sided weakness    Rx / DC Orders ED Discharge Orders          Ordered    Ambulatory referral to Neurology       Comments: An appointment is requested in approximately: 2 weeks   02/27/23 0645    metoCLOPramide (REGLAN) 10 MG tablet  Every 6 hours,   Status:  Discontinued        02/27/23 [place], Malavika Lira S, PA [id number]    Delora Fuel, MD [phone] [id number]

## 2023-02-27 NOTE — ED Notes (Signed)
Pt unhooked themselves from the monitor and ambulated to the bathroom without assistance.

## 2023-02-27 NOTE — Discharge Instructions (Addendum)
I have given you the information for a neurologist to follow-up with and I put in a referral.  Please call to make an appointment ASAP.  May always return the emergency room for any new or concerning symptoms.

## 2023-02-27 NOTE — ED Notes (Signed)
Noises heard coming from pt room and she had slid herself to the end of the bed and was standing stating need to urinate. Pt able to bear her own weight while leaning towards the R. Pt placed in a wheelchair and taken to the bathroom and was then reconnected to monitoring.

## 2023-02-27 NOTE — ED Triage Notes (Signed)
Patient brought in by [facility] after reports of left sided weakness and numbnes. Patient dropped book while reading it. EMS confirmed left sided weakness and activated code stroke. Patient also reports chest pain. LKW 0140.

## 2023-02-27 NOTE — Code Documentation (Addendum)
Stroke Response Nurse Documentation Code Documentation  Brenda Todd is a 42 y.o. female arriving to [facility] [place]  via [facility] on 3/24 with past medical hx of CAD with MI/stents, HTN. On No antithrombotic. Code stroke was activated by EMS.   Patient from home where she was LKW at [place] and now complaining of left sided weakness and numbness .  Stroke team at the bedside on patient arrival. Labs drawn and patient cleared for CT by Dr. Roxanne Mins. Patient to CT with team. NIHSS 5, see documentation for details and code stroke times. Patient with left facial droop, left arm weakness, left leg weakness, and left decreased sensation on exam. The following imaging was completed:  CT Head and MRI. Patient is not a candidate for IV Thrombolytic due to MRI negative. Patient is not a candidate for IR due to MRI negative. Code stroke cancelled per Dr. Leonel Ramsay.   Care Plan: migraine cocktail.    Bedside handoff with ED RN Abe People.    Madelynn Done  Rapid Response RN

## 2023-02-28 ENCOUNTER — Observation Stay: Payer: Medicaid Other

## 2023-02-28 ENCOUNTER — Observation Stay
Admission: EM | Admit: 2023-02-28 | Discharge: 2023-03-01 | Disposition: A | Discharge status: Home / Self Care | Payer: Medicaid Other | Attending: Internal Medicine | Admitting: Internal Medicine

## 2023-02-28 ENCOUNTER — Emergency Department: Payer: Medicaid Other

## 2023-02-28 DIAGNOSIS — Z6825 Body mass index (BMI) 25.0-25.9, adult: Secondary | ICD-10-CM | POA: Insufficient documentation

## 2023-02-28 DIAGNOSIS — E669 Obesity, unspecified: Secondary | ICD-10-CM | POA: Diagnosis not present

## 2023-02-28 DIAGNOSIS — N200 Calculus of kidney: Secondary | ICD-10-CM | POA: Diagnosis not present

## 2023-02-28 DIAGNOSIS — I639 Cerebral infarction, unspecified: Principal | ICD-10-CM | POA: Insufficient documentation

## 2023-02-28 DIAGNOSIS — Z79899 Other long term (current) drug therapy: Secondary | ICD-10-CM | POA: Diagnosis not present

## 2023-02-28 DIAGNOSIS — R531 Weakness: Secondary | ICD-10-CM

## 2023-02-28 DIAGNOSIS — E876 Hypokalemia: Secondary | ICD-10-CM | POA: Insufficient documentation

## 2023-02-28 DIAGNOSIS — R202 Paresthesia of skin: Secondary | ICD-10-CM | POA: Diagnosis not present

## 2023-02-28 DIAGNOSIS — F17210 Nicotine dependence, cigarettes, uncomplicated: Secondary | ICD-10-CM | POA: Insufficient documentation

## 2023-02-28 DIAGNOSIS — M6281 Muscle weakness (generalized): Secondary | ICD-10-CM

## 2023-02-28 DIAGNOSIS — I160 Hypertensive urgency: Secondary | ICD-10-CM | POA: Diagnosis not present

## 2023-02-28 DIAGNOSIS — I2510 Atherosclerotic heart disease of native coronary artery without angina pectoris: Secondary | ICD-10-CM | POA: Insufficient documentation

## 2023-02-28 DIAGNOSIS — E663 Overweight: Secondary | ICD-10-CM | POA: Insufficient documentation

## 2023-02-28 DIAGNOSIS — E785 Hyperlipidemia, unspecified: Secondary | ICD-10-CM | POA: Insufficient documentation

## 2023-02-28 DIAGNOSIS — I10 Essential (primary) hypertension: Secondary | ICD-10-CM | POA: Diagnosis not present

## 2023-02-28 LAB — APTT: aPTT: 28 seconds (ref 24–36)

## 2023-02-28 LAB — PROTIME-INR
INR: 1.1 (ref 0.8–1.2)
Prothrombin Time: 13.7 seconds (ref 11.4–15.2)

## 2023-02-28 LAB — URINALYSIS, ROUTINE W REFLEX MICROSCOPIC
Bilirubin Urine: NEGATIVE
Glucose, UA: NEGATIVE mg/dL
Hgb urine dipstick: NEGATIVE
Ketones, ur: 20 mg/dL — AB
Leukocytes,Ua: NEGATIVE
Nitrite: NEGATIVE
Protein, ur: NEGATIVE mg/dL
Specific Gravity, Urine: 1.032 — ABNORMAL HIGH (ref 1.005–1.030)
pH: 7 (ref 5.0–8.0)

## 2023-02-28 LAB — ETHANOL: Alcohol, Ethyl (B): <10 mg/dL (ref <10)

## 2023-02-28 LAB — COMPREHENSIVE METABOLIC PANEL
ALT: 15 U/L (ref 0–44)
AST: 14 U/L — ABNORMAL LOW (ref 15–41)
Albumin: 4.4 g/dL (ref 3.5–5.0)
Alkaline Phosphatase: 54 U/L (ref 38–126)
Anion gap: 13 (ref 5–15)
BUN: 12 mg/dL (ref 6–20)
CO2: 21 mmol/L — ABNORMAL LOW (ref 22–32)
Calcium: 9 mg/dL (ref 8.9–10.3)
Chloride: 105 mmol/L (ref 98–111)
Creatinine, Ser: 0.66 mg/dL (ref 0.44–1.00)
GFR, Estimated: >60 mL/min (ref >60)
Glucose, Bld: 111 mg/dL — ABNORMAL HIGH (ref 70–99)
Potassium: 3.2 mmol/L — ABNORMAL LOW (ref 3.5–5.1)
Sodium: 139 mmol/L (ref 135–145)
Total Bilirubin: 0.9 mg/dL (ref 0.3–1.2)
Total Protein: 8.1 g/dL (ref 6.5–8.1)

## 2023-02-28 LAB — URINE DRUG SCREEN, QUALITATIVE (ARMC ONLY)
Amphetamines, Ur Screen: NOT DETECTED
Barbiturates, Ur Screen: NOT DETECTED
Benzodiazepine, Ur Scrn: POSITIVE — AB
Cannabinoid 50 Ng, Ur ~~LOC~~: POSITIVE — AB
Cocaine Metabolite,Ur ~~LOC~~: NOT DETECTED
MDMA (Ecstasy)Ur Screen: NOT DETECTED
Methadone Scn, Ur: NOT DETECTED
Opiate, Ur Screen: NOT DETECTED
Phencyclidine (PCP) Ur S: NOT DETECTED
Tricyclic, Ur Screen: NOT DETECTED

## 2023-02-28 LAB — CBC
HCT: 48.9 % — ABNORMAL HIGH (ref 36.0–46.0)
Hemoglobin: 15.7 g/dL — ABNORMAL HIGH (ref 12.0–15.0)
MCH: 26 pg (ref 26.0–34.0)
MCHC: 32.1 g/dL (ref 30.0–36.0)
MCV: 81.1 fL (ref 80.0–100.0)
Platelets: 295 10*3/uL (ref 150–400)
RBC: 6.03 MIL/uL — ABNORMAL HIGH (ref 3.87–5.11)
RDW: 13.8 % (ref 11.5–15.5)
WBC: 12 10*3/uL — ABNORMAL HIGH (ref 4.0–10.5)
nRBC: 0 % (ref 0.0–0.2)

## 2023-02-28 LAB — DIFFERENTIAL
Abs Immature Granulocytes: 0.04 10*3/uL (ref 0.00–0.07)
Basophils Absolute: 0.1 10*3/uL (ref 0.0–0.1)
Basophils Relative: 1 %
Eosinophils Absolute: 0 10*3/uL (ref 0.0–0.5)
Eosinophils Relative: 0 %
Immature Granulocytes: 0 %
Lymphocytes Relative: 24 %
Lymphs Abs: 2.8 10*3/uL (ref 0.7–4.0)
Monocytes Absolute: 0.6 10*3/uL (ref 0.1–1.0)
Monocytes Relative: 5 %
Neutro Abs: 8.5 10*3/uL — ABNORMAL HIGH (ref 1.7–7.7)
Neutrophils Relative %: 70 %

## 2023-02-28 LAB — LIPID PANEL
Cholesterol: 194 mg/dL (ref 0–200)
HDL: 49 mg/dL (ref >40)
LDL Cholesterol: 127 mg/dL — ABNORMAL HIGH (ref 0–99)
Total CHOL/HDL Ratio: 4 RATIO
Triglycerides: 92 mg/dL (ref <150)
VLDL: 18 mg/dL (ref 0–40)

## 2023-02-28 LAB — PREGNANCY, URINE: Preg Test, Ur: NEGATIVE

## 2023-02-28 MED ORDER — ENOXAPARIN SODIUM 40 MG/0.4ML IJ SOSY
40.0000 mg | PREFILLED_SYRINGE | INTRAMUSCULAR | Status: DC
  Administered 2023-02-28: 40 mg via SUBCUTANEOUS
  Filled 2023-02-28: qty 0.4

## 2023-02-28 MED ORDER — SENNOSIDES-DOCUSATE SODIUM 8.6-50 MG PO TABS: 1.0000 | ORAL_TABLET | Freq: Every evening | ORAL | Status: DC | PRN

## 2023-02-28 MED ORDER — LORAZEPAM 1 MG PO TABS: 1.0000 mg | ORAL_TABLET | Freq: Once | ORAL | Status: DC | PRN

## 2023-02-28 MED ORDER — IOHEXOL 350 MG/ML SOLN
75.0000 mL | Freq: Once | INTRAVENOUS | Status: AC | PRN
  Administered 2023-02-28: 75 mL via INTRAVENOUS

## 2023-02-28 MED ORDER — ACETAMINOPHEN 325 MG PO TABS: 650.0000 mg | ORAL_TABLET | ORAL | Status: DC | PRN

## 2023-02-28 MED ORDER — ATORVASTATIN CALCIUM 20 MG PO TABS
80.0000 mg | ORAL_TABLET | Freq: Every day | ORAL | Status: DC
  Administered 2023-02-28 – 2023-03-01 (×2): 80 mg via ORAL
  Filled 2023-02-28 (×2): qty 4

## 2023-02-28 MED ORDER — AMLODIPINE BESYLATE 5 MG PO TABS
10.0000 mg | ORAL_TABLET | Freq: Once | ORAL | Status: AC
  Administered 2023-02-28: 10 mg via ORAL
  Filled 2023-02-28: qty 2

## 2023-02-28 MED ORDER — LORAZEPAM 1 MG PO TABS
2.0000 mg | ORAL_TABLET | Freq: Once | ORAL | Status: AC | PRN
  Administered 2023-02-28: 2 mg via ORAL
  Filled 2023-02-28: qty 2

## 2023-02-28 MED ORDER — ACETAMINOPHEN 650 MG RE SUPP: 650.0000 mg | RECTAL | Status: DC | PRN

## 2023-02-28 MED ORDER — CLOPIDOGREL BISULFATE 75 MG PO TABS
75.0000 mg | ORAL_TABLET | Freq: Every day | ORAL | Status: DC
  Administered 2023-02-28 – 2023-03-01 (×2): 75 mg via ORAL
  Filled 2023-02-28 (×2): qty 1

## 2023-02-28 MED ORDER — METOPROLOL TARTRATE 25 MG PO TABS
25.0000 mg | ORAL_TABLET | ORAL | Status: AC
  Administered 2023-02-28: 25 mg via ORAL
  Filled 2023-02-28: qty 1

## 2023-02-28 MED ORDER — TAMSULOSIN HCL 0.4 MG PO CAPS
0.4000 mg | ORAL_CAPSULE | Freq: Every day | ORAL | Status: DC
  Administered 2023-02-28: 0.4 mg via ORAL
  Filled 2023-02-28: qty 1

## 2023-02-28 MED ORDER — ACETAMINOPHEN 160 MG/5ML PO SOLN: 650.0000 mg | ORAL | Status: DC | PRN

## 2023-02-28 MED ORDER — STROKE: EARLY STAGES OF RECOVERY BOOK: Freq: Once | Status: AC

## 2023-02-28 MED ORDER — LORAZEPAM 1 MG PO TABS
1.0000 mg | ORAL_TABLET | Freq: Once | ORAL | Status: DC | PRN
  Filled 2023-02-28: qty 1

## 2023-02-28 MED ORDER — LORAZEPAM 1 MG PO TABS: 2.0000 mg | ORAL_TABLET | Freq: Once | ORAL | Status: DC | PRN

## 2023-02-28 MED ORDER — POTASSIUM CHLORIDE CRYS ER 20 MEQ PO TBCR
40.0000 meq | EXTENDED_RELEASE_TABLET | Freq: Once | ORAL | Status: AC
  Administered 2023-02-28: 40 meq via ORAL
  Filled 2023-02-28: qty 2

## 2023-02-28 MED ORDER — ASPIRIN 81 MG PO TBEC
81.0000 mg | DELAYED_RELEASE_TABLET | Freq: Every day | ORAL | Status: DC
  Administered 2023-02-28 – 2023-03-01 (×2): 81 mg via ORAL
  Filled 2023-02-28 (×2): qty 1

## 2023-02-28 MED ORDER — TRAMADOL HCL 50 MG PO TABS
50.0000 mg | ORAL_TABLET | Freq: Four times a day (QID) | ORAL | Status: DC | PRN
  Administered 2023-02-28 – 2023-03-01 (×2): 50 mg via ORAL
  Filled 2023-02-28 (×2): qty 1

## 2023-02-28 MED ORDER — SODIUM CHLORIDE 0.9% FLUSH: 3.0000 mL | Freq: Once | INTRAVENOUS | Status: DC

## 2023-02-28 MED ORDER — OXYCODONE HCL 5 MG PO TABS
5.0000 mg | ORAL_TABLET | Freq: Once | ORAL | Status: AC
  Administered 2023-02-28: 5 mg via ORAL
  Filled 2023-02-28: qty 1

## 2023-02-28 NOTE — Assessment & Plan Note (Signed)
Patient is presenting with sudden onset left upper extremity and lower extremity weakness with facial droop.  She was evaluated yesterday with negative CT and MRI of the brain.  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  - Neurology consulted; appreciate their recommendations - CTA head/neck pending - Telemetry monitoring - Allow for permissive HTN (systolic < [id number] and diastolic < 123456)  - Echocardiogram with bubble study - A1C and Lipid panel  - ASA 81 mg daily  - Restart home atorvastatin 80 mg daily - PT/OT/SLP

## 2023-02-28 NOTE — ED Notes (Signed)
EDP Quale notified via secure chat of HTN as is currently in another pt's room.

## 2023-02-28 NOTE — Consult Note (Signed)
NEUROLOGY CONSULTATION NOTE   Date of service: February 28, 2023 Patient Name: Brenda Todd MRN:  [id number] DOB:  [birth year] Reason for consult: stroke-like symptoms Requesting physician: Dr. Delman Kitten _ _ _   _ __   _ __ _ _  __ __   _ __   __ _  History of Present Illness   This is a 42 yo woman with hx chronic HTN, prior MI, kidney stones who presents to ED for 2nd time in 2 days for L sided weakness. Saturday night she woke up and noted L face numbness and weakness of her LUE and LLE. She presented to [facility] ED and stroke code was called. CT head and MRI brain both showed no acute findings and patient was discharged home with recommendation for outpatient neurology f/u. Since yesterday patient feels that her sx have gotten worse and now she can barely lift her LUE. 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   ROS   Per HPI: all other systems reviewed and are negative  Past History   I have reviewed the following:  Past Medical History:  Diagnosis Date   Chronic hypertension    Kidney calculi    MI (myocardial infarction) ([place])    UTI (lower urinary tract infection)    Past Surgical History:  Procedure Laterality Date   APPENDECTOMY     l/s. ruptured   CARDIAC SURGERY     CESAREAN SECTION N/A 12/17/2015   Procedure: CESAREAN SECTION;  Surgeon: Honor Loh Ward, MD;  Location: [facility] ORS;  Service: Obstetrics;  Laterality: N/A;   LEFT HEART CATH AND CORONARY ANGIOGRAPHY N/A 09/02/2020   Procedure: LEFT HEART CATH AND CORONARY ANGIOGRAPHY;  Surgeon: Nelva Bush, MD;  Location: [place] [facility];  Service: Cardiovascular;  Laterality: N/A;   LITHOTRIPSY     No family history on file. Social History   Socioeconomic History   Marital status: Divorced    Spouse name: Not on file   Number of children: Not on file   Years of education: Not on file   Highest education level: Not on  file  Occupational History   Not on file  Tobacco Use   Smoking status: Every Day    Packs/day: .25    Types: Cigarettes   Smokeless tobacco: Never  Vaping Use   Vaping Use: Never used  Substance and Sexual Activity   Alcohol use: No    Alcohol/week: 0.0 standard drinks of alcohol   Drug use: Yes    Types: Marijuana   Sexual activity: Yes    Birth control/protection: Condom  Other Topics Concern   Not on file  Social History Narrative   Not on file   Social Determinants of Health   Financial Resource Strain: Not on file  Food Insecurity: Not on file  Transportation Needs: Not on file  Physical Activity: Not on file  Stress: Not on file  Social Connections: Not on file   Allergies  Allergen Reactions   No [employer] Food Allergies Anaphylaxis and Other (See Comments)    Cherries   Penicillins Anaphylaxis and Hives   Reglan [Metoclopramide] Anxiety    Medications   (Not in a hospital admission)     Current Facility-Administered Medications:    [START ON 03/01/2023]  stroke: early stages of recovery book, , Does not apply, Once, Jose Persia, MD   acetaminophen (TYLENOL) tablet 650  mg, 650 mg, Oral, Q4H PRN **OR** acetaminophen (TYLENOL) 160 MG/5ML solution 650 mg, 650 mg, Per Tube, Q4H PRN **OR** acetaminophen (TYLENOL) suppository 650 mg, 650 mg, Rectal, Q4H PRN, Jose Persia, MD   aspirin EC tablet 81 mg, 81 mg, Oral, Daily, Jose Persia, MD   atorvastatin (LIPITOR) tablet 80 mg, 80 mg, Oral, Daily, Jose Persia, MD   enoxaparin (LOVENOX) injection 40 mg, 40 mg, Subcutaneous, Q24H, Charleen Kirks, [place], MD   LORazepam (ATIVAN) tablet 1 mg, 1 mg, Oral, Once PRN, Jose Persia, MD   potassium chloride SA (KLOR-CON M) CR tablet 40 mEq, 40 mEq, Oral, Once, Jose Persia, MD   senna-docusate (Senokot-S) tablet 1 tablet, 1 tablet, Oral, QHS PRN, Jose Persia, MD   sodium chloride flush (NS) 0.9 % injection 3 mL, 3 mL, Intravenous, Once, Delman Kitten, MD    tamsulosin (FLOMAX) capsule 0.4 mg, 0.4 mg, Oral, QPC supper, Jose Persia, MD   traMADol (ULTRAM) tablet 50 mg, 50 mg, Oral, Q6H PRN, Jose Persia, MD  Current Outpatient Medications:    amLODipine (NORVASC) 10 MG tablet, Take 1 tablet (10 mg total) by mouth daily., Disp: 30 tablet, Rfl: 0   aspirin 81 MG chewable tablet, Chew 1 tablet (81 mg total) by mouth daily., Disp: 90 tablet, Rfl: 1   atorvastatin (LIPITOR) 80 MG tablet, Take 1 tablet (80 mg total) by mouth daily. (Patient not taking: Reported on 05/19/2022), Disp: 30 tablet, Rfl: 0   budesonide (EQ BUDESONIDE NASAL) 32 MCG/ACT nasal spray, Place 1 spray into both nostrils daily. (Patient not taking: Reported on 05/19/2022), Disp: 8.43 mL, Rfl: 0   ibuprofen (ADVIL) 600 MG tablet, Take 1 tablet (600 mg total) by mouth every 6 (six) hours as needed. (Patient not taking: Reported on 02/28/2023), Disp: 30 tablet, Rfl: 0   isosorbide mononitrate (IMDUR) 30 MG 24 hr tablet, Take 1 tablet (30 mg total) by mouth daily. (Patient not taking: Reported on 05/19/2022), Disp: 30 tablet, Rfl: 0   levocetirizine (XYZAL) 5 MG tablet, Take 1 tablet (5 mg total) by mouth every evening. (Patient not taking: Reported on 05/19/2022), Disp: 30 tablet, Rfl: 0   metoprolol tartrate (LOPRESSOR) 25 MG tablet, Take 1 tablet (25 mg total) by mouth 2 (two) times daily. (Patient not taking: Reported on 05/19/2022), Disp: 60 tablet, Rfl: 1   ondansetron (ZOFRAN-ODT) 8 MG disintegrating tablet, Take 1 tablet (8 mg total) by mouth every 8 (eight) hours as needed for nausea or vomiting. (Patient not taking: Reported on 02/28/2023), Disp: 15 tablet, Rfl: 0   tamsulosin (FLOMAX) 0.4 MG CAPS capsule, Take 1 capsule (0.4 mg total) by mouth daily after supper. (Patient not taking: Reported on 02/28/2023), Disp: 10 capsule, Rfl: 0  Vitals   Vitals:   02/28/23 1630 02/28/23 1631 02/28/23 1635 02/28/23 1714  BP:    (!) 189/118  Pulse: 68   79  Resp: (!) 21 20 20 15   Temp:       TempSrc:      SpO2:    100%  Weight:         Body mass index is 28.87 kg/m.  Physical Exam   Physical Exam Gen: A&O x4, NAD HEENT: Atraumatic, normocephalic;mucous membranes moist; oropharynx clear, tongue without atrophy or fasciculations. Neck: Supple, trachea midline. Resp: CTAB, no w/r/r CV: RRR, no m/g/r; nml S1 and S2. 2+ symmetric peripheral pulses. Abd: soft/NT/ND; nabs x 4 quad Extrem: Nml bulk; no cyanosis, clubbing, or edema.  Neuro: *MS: A&O x4. Follows multi-step commands.  *Speech:  fluid, nondysarthric, able to name and repeat *CN:    I: Deferred   II,III: PERRLA, VFF by confrontation, optic discs unable to be visualized 2/2 pupillary constriction   III,IV,VI: EOMI w/o nystagmus, no ptosis   V: Impaired to LT on L   VII: Eyelid closure was full.  L NLF flattening   VIII: Hearing intact to voice   IX,X: Voice normal, palate elevates symmetrically    XI: SCM/trap 5/5 bilat   XII: Tongue protrudes midline, no atrophy or fasciculations  *Motor:   Normal bulk.  No tremor, rigidity or bradykinesia. Drift to bed LUE and LLE. Some giveway weakness on LUE exam. RUE and RLE full strength. *Sensory: Impaired to LT on L Propioception intact bilat.  No double-simultaneous extinction.  *Coordination:  FNF intact on R *Reflexes:  2+ and symmetric throughout without clonus; toes down-going bilat *Gait: deferred  NIHSS  1a Level of Conscious.: 0 1b LOC Questions: 0 1c LOC Commands: 0 2 Best Gaze: 0 3 Visual: 0 4 Facial Palsy: 1 5a Motor Arm - left: 2 5b Motor Arm - Right: 0 6a Motor Leg - Left: 2 6b Motor Leg - Right: 0 7 Limb Ataxia: 0 8 Sensory: 1 9 Best Language: 0 10 Dysarthria: 0 11 Extinct. and Inatten.: 0  TOTAL: 6   Premorbid mRS = 0 (prior to presentation yesterday), 2 since presentation yesterday   Labs   CBC:  Recent Labs  Lab 02/27/23 0229 02/28/23 1247  WBC 11.6* 12.0*  NEUTROABS 7.5 8.5*  HGB 14.3 15.7*  HCT 42.8 48.9*  MCV 80.6  81.1  PLT 260 [id number]    Basic Metabolic Panel:  Lab Results  Component Value Date   NA 139 02/28/2023   K 3.2 (L) 02/28/2023   CO2 21 (L) 02/28/2023   GLUCOSE 111 (H) 02/28/2023   BUN 12 02/28/2023   CREATININE 0.66 02/28/2023   CALCIUM 9.0 02/28/2023   GFRNONAA >60 02/28/2023   GFRAA >60 09/03/2020   Lipid Panel:  Lab Results  Component Value Date   LDLCALC 95 09/01/2020   HgbA1c:  Lab Results  Component Value Date   HGBA1C 5.6 09/02/2020   Urine Drug Screen:     Component Value Date/Time   LABOPIA NONE DETECTED 02/28/2023 1252   LABOPIA NONE DETECTED 02/27/2023 0332   COCAINSCRNUR NONE DETECTED 02/28/2023 1252   LABBENZ POSITIVE (A) 02/28/2023 1252   LABBENZ NONE DETECTED 02/27/2023 0332   AMPHETMU NONE DETECTED 02/28/2023 1252   AMPHETMU NONE DETECTED 02/27/2023 0332   THCU POSITIVE (A) 02/28/2023 1252   THCU POSITIVE (A) 02/27/2023 0332   LABBARB NONE DETECTED 02/28/2023 1252   LABBARB NONE DETECTED 02/27/2023 0332    Alcohol Level     Component Value Date/Time   ETH <10 02/28/2023 1252    MRI Brain 11 mm acute infarct at the posterior limb right internal capsule.    Impression   This is a 42 yo woman with hx chronic HTN, prior MI, kidney stones who presents to ED for 2nd time in 2 days for L sided weakness. Yesterday she presented to [facility] ED for same and CT head and MRI brain were negative for acute findings and she was discharged home. Sx were initially stuttering but worsened yesterday evening and have persisted since then. Prodrome consistent with capsular warning syndrome. 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  Recommendations   - Admit for stroke workup - Permissive HTN x48  hrs from sx onset goal BP <220/110. PRN labetalol or hydralazine if BP above these parameters. Avoid oral antihypertensives. - CTA H&N - TTE w/ bubble - Check A1c and LDL + add statin per guidelines - ASA  81mg  daily + plavix 75mg  daily x21 days f/b ASA 81mg  daily monotherapy after that - q4 hr neuro checks - STAT head CT for any change in neuro exam - Tele - PT/OT/SLP - Stroke education - Amb referral to neurology upon discharge - Will continue to follow  ______________________________________________________________________   Thank you for the opportunity to take part in the care of this patient. If you have any further questions, please contact the neurology consultation attending.  Signed,  Su Monks, MD [facility] [phone]  If 7pm- 7am, please page neurology on call as listed in [place].  **Any copied and pasted documentation in this note was written by me in another application not billed for and pasted by me into this document.

## 2023-02-28 NOTE — ED Triage Notes (Signed)
Pt sts that she has been having left sided weakness since Sunday morning. Pt sts that she went to [facility] on Sunday night. Pt sts that she was not given any answers except left sided weakness. Pt sts that she was told to follow up with Neurologist and take a amlodipine.

## 2023-02-28 NOTE — ED Notes (Addendum)
Pt's speech clear to this RN during basic conversation, A&OX4, pt anxious; pt states received care in relation to this left sided weakness - especially arm recently but was not treated well by staff at that facility so decided to seek care here; pt denies HA, CP, SOB; kidney stone per pt that she has been trying to pass for 2 days; denies fever; rash like petechia at hands; pt reports mother-in-law told her her voice sounds slurred today; pt easy to understand; smile is uneven.

## 2023-02-28 NOTE — Assessment & Plan Note (Signed)
-   Restart home aspirin - Restart home statin - Lipid panel pending - Will need to establish with outpatient cardiology

## 2023-02-28 NOTE — Assessment & Plan Note (Addendum)
Patient endorses worsening left flank pain, dysuria, hematuria, and urgency with a history of nephrolithiasis and pyelonephritis.   - Urinalysis - Will send for culture if indicated - CT renal stone study - Restart home tamsulosin - Tramadol for pain control

## 2023-02-28 NOTE — H&P (Signed)
History and Physical    Patient: Brenda Todd [id number] DOB: 03-23-1981 DOA: 02/28/2023 DOS: the patient was seen and examined on 02/28/2023 PCP: Pcp, No  Patient coming from: Home  Chief Complaint:   HPI: Brenda Todd is a 42 y.o. female with medical history significant of hypertension, CAD s/p DES to rPDA and rPL1, nephrolithiasis, who presents to the ED due to left-sided weakness  [name] states that yesterday, she was reading and dropped her book.  When she went to go pick it up, she noted left arm weakness in addition to left leg weakness.  She was also having numbness in the left side of her face.  Due to this, she went to the ED.  She notes that her left leg weakness has begun to improve but her left arm weakness is persistent and she still has the feeling her face is uneven.  She denies any prior history of stroke.  She denies any chest pain, palpitations, shortness of breath.  She notes that she ran out of her blood pressure medication and aspirin 3 days ago  [name] also endorses left flank pain in addition to dysuria, hematuria and urgency that has been occurring for the last few days.  She notes that she has a chronic history of nephrolithiasis that runs in her family and she has required lithotripsy in the past.  Per chart review, patient presented to the ED yesterday with left arm weakness.  She was evaluated at [facility] as a code stroke.  CT of the head and MRI did not show any acute abnormalities.  She was evaluated by neurology and discharged home.  ED course: On arrival to the ED, patient was hypertensive at 224/130 with heart rate of 106.  She was saturating at 98% on room air.  She was afebrile at 98.5.  Heart rate improved to 75.  Initial workup notable for WBC of 12.0, hemoglobin of 15.7, potassium 3.2, creatinine 0.66, AST 14, ALT 15 and GFR above 60.  PTT and INR within normal limits.  UDS was obtained that demonstrated positive for cannabinoids and benzos.  MRI of the brain was obtained that demonstrated 11 mm acute infarct in the posterior limb of the right internal capsule.  Neurology consulted.  [facility] contacted for admission.  Review of Systems: As mentioned in the history of present illness. All other systems reviewed and are negative.  Past Medical History:  Diagnosis Date   Chronic hypertension    Kidney calculi    MI (myocardial infarction) ([place])    UTI (lower urinary tract infection)    Past Surgical History:  Procedure Laterality Date   APPENDECTOMY     l/s. ruptured   CARDIAC SURGERY     CESAREAN SECTION N/A 12/17/2015   Procedure: CESAREAN SECTION;  Surgeon: Honor Loh Ward, MD;  Location: [facility] ORS;  Service: Obstetrics;  Laterality: N/A;   LEFT HEART CATH AND CORONARY ANGIOGRAPHY N/A 09/02/2020   Procedure: LEFT HEART CATH AND CORONARY ANGIOGRAPHY;  Surgeon: Nelva Bush, MD;  Location: [place] [facility];  Service: Cardiovascular;  Laterality: N/A;   LITHOTRIPSY     Social History:  reports that she has been smoking cigarettes. She has been smoking an average of .25 packs per day. She has never used smokeless tobacco. She reports current drug use. Drug: Marijuana. She reports that she does not drink alcohol.  Allergies  Allergen Reactions   No [employer] Food Allergies Anaphylaxis and Other (See Comments)    Cherries  Penicillins Anaphylaxis and Hives   Reglan [Metoclopramide] Anxiety    No family history on file.  Prior to Admission medications   Medication Sig Start Date End Date Taking? Authorizing Provider  amLODipine (NORVASC) 10 MG tablet Take 1 tablet (10 mg total) by mouth daily. 02/27/23 03/29/23 Yes Aberman, Druscilla Brownie, PA-C  aspirin 81 MG chewable tablet Chew 1 tablet (81 mg total) by mouth daily. 04/29/21  Yes Wouk, Ailene Rud, MD  atorvastatin (LIPITOR) 80 MG tablet Take 1 tablet (80 mg total) by mouth daily. Patient not taking: Reported on 05/19/2022 04/29/21 05/29/21  Gwynne Edinger, MD  budesonide  (EQ BUDESONIDE NASAL) 32 MCG/ACT nasal spray Place 1 spray into both nostrils daily. Patient not taking: Reported on 05/19/2022 02/05/22   Geryl Councilman L, PA  ibuprofen (ADVIL) 600 MG tablet Take 1 tablet (600 mg total) by mouth every 6 (six) hours as needed. Patient not taking: Reported on 02/28/2023 06/27/22   Arta Silence, MD  isosorbide mononitrate (IMDUR) 30 MG 24 hr tablet Take 1 tablet (30 mg total) by mouth daily. Patient not taking: Reported on 05/19/2022 04/29/21 05/29/21  Gwynne Edinger, MD  levocetirizine (XYZAL) 5 MG tablet Take 1 tablet (5 mg total) by mouth every evening. Patient not taking: Reported on 05/19/2022 02/05/22   Geryl Councilman L, PA  metoprolol tartrate (LOPRESSOR) 25 MG tablet Take 1 tablet (25 mg total) by mouth 2 (two) times daily. Patient not taking: Reported on 05/19/2022 04/29/21   Gwynne Edinger, MD  ondansetron (ZOFRAN-ODT) 8 MG disintegrating tablet Take 1 tablet (8 mg total) by mouth every 8 (eight) hours as needed for nausea or vomiting. Patient not taking: Reported on 02/28/2023 11/11/22   Arta Silence, MD  tamsulosin (FLOMAX) 0.4 MG CAPS capsule Take 1 capsule (0.4 mg total) by mouth daily after supper. Patient not taking: Reported on 02/28/2023 06/27/22   Arta Silence, MD    Physical Exam: Vitals:   02/28/23 1630 02/28/23 1631 02/28/23 1635 02/28/23 1714  BP:    (!) 189/118  Pulse: 68   79  Resp: (!) 21 20 20 15   Temp:      TempSrc:      SpO2:    100%  Weight:       Physical Exam Vitals and nursing note reviewed.  Constitutional:      General: She is not in acute distress.    Appearance: She is normal weight. She is not toxic-appearing.  HENT:     Head: Normocephalic and atraumatic.     Mouth/Throat:     Mouth: Mucous membranes are moist.     Pharynx: Oropharynx is clear.  Eyes:     General: No scleral icterus.    Extraocular Movements: Extraocular movements intact.     Conjunctiva/sclera: Conjunctivae normal.      Pupils: Pupils are equal, round, and reactive to light.  Cardiovascular:     Rate and Rhythm: Normal rate and regular rhythm.     Heart sounds: No murmur heard.    No gallop.  Pulmonary:     Effort: Pulmonary effort is normal. No respiratory distress.     Breath sounds: Normal breath sounds. No wheezing, rhonchi or rales.  Abdominal:     General: Bowel sounds are normal.     Palpations: Abdomen is soft.  Musculoskeletal:     Right lower leg: No edema.     Left lower leg: No edema.  Skin:    General: Skin is warm and dry.  Neurological:  Mental Status: She is alert.     Comments:  Patient is alert and oriented x 3 No dysarthria noted.  Mild facial asymmetry with left-sided facial droop Subjective decrease sensation on the left side of the face 5 out of 5 strength of upper and lower extremities on the right.  3/5 strength of the left upper extremity and 4/5 strength of the left lower extremity  Psychiatric:        Mood and Affect: Mood normal.        Behavior: Behavior normal.    Data Reviewed: CBC with WBC of 12.0, hemoglobin of 15.7, MCV of 81, platelets of 295 CMP with sodium of 139, potassium 3.2, bicarb 21, glucose 111, creatinine 0.66 with BUN of 12, calcium 9.0, anion gap 13, AST 14, ALT 15, GFR above 60 INR within normal limits at 1.1 PTT within normal limits at 28 Negative urine pregnancy test  EKG personally reviewed.  Sinus rhythm with rate of 74.  No ST or T wave changes concerning for acute ischemia.  MR BRAIN WO CONTRAST  Result Date: 02/28/2023 CLINICAL DATA:  Neuro deficit with acute stroke suspected. Left-sided weakness since Sunday morning. EXAM: MRI HEAD WITHOUT CONTRAST TECHNIQUE: Multiplanar, multiecho pulse sequences of the brain and surrounding structures were obtained without intravenous contrast. COMPARISON:  Yesterday FINDINGS: Brain: Ovoid acute infarct at the posterior limb of the right internal capsule measuring 11 mm. No acute hemorrhage,  hydrocephalus, or masslike finding. No hydrocephalus or collection. There are a few small areas of FLAIR hyperintensity in the cerebral white matter, likely small vessel ischemic insults given the acute finding. There may have been a prior lacunar infarct at the right caudate head which appears blunted along the lateral ventricle. No abnormal mineralization. Vascular: Major flow voids are preserved Skull and upper cervical spine: Normal marrow signal Sinuses/Orbits: Unremarkable IMPRESSION: 11 mm acute infarct at the posterior limb right internal capsule. Electronically Signed   By: Jorje Guild M.D.   On: 02/28/2023 15:53    There are no new results to review at this time.  Assessment and Plan:  * Acute CVA (cerebrovascular accident) [facility]) Patient is presenting with sudden onset left upper extremity and lower extremity weakness with facial droop.  She was evaluated yesterday with negative CT and MRI of the brain.  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  - Neurology consulted; appreciate their recommendations - CTA head/neck pending - Telemetry monitoring - Allow for permissive HTN (systolic < [id number] and diastolic < 123456)  - Echocardiogram with bubble study - A1C and Lipid panel  - ASA 81 mg daily  - Restart home atorvastatin 80 mg daily - PT/OT/SLP  Hypertensive urgency In the setting of medication noncompliance, as patient states she ran out approximately 3 days ago.  However on medication reconciliation, I am unable to find any recent refills.  Of note, patient also has persistent hypokalemia.  Given young age and hypokalemia, it would be prudent to evaluate for hyperaldosteronism  - Hold home antihypertensives to allow for permissive hypertension - Aldosterone/renin pending - As needed labetalol for SBP above 220 or DBP above 120  Coronary artery disease - Restart home aspirin - Restart home statin -  Lipid panel pending - Will need to establish with outpatient cardiology  Nephrolithiasis Patient endorses worsening left flank pain, dysuria, hematuria, and urgency with a history of nephrolithiasis and pyelonephritis.   - Urinalysis - Will send for  culture if indicated - CT renal stone study - Restart home tamsulosin - Tramadol for pain control  Advance Care Planning:   Code Status: Full Code   Consults: Neurology  Family Communication: No family at bedside  Severity of Illness: The appropriate patient status for this patient is OBSERVATION. Observation status is judged to be reasonable and necessary in order to provide the required intensity of service to ensure the patient's safety. The patient's presenting symptoms, physical exam findings, and initial radiographic and laboratory data in the context of their medical condition is felt to place them at decreased risk for further clinical deterioration. Furthermore, it is anticipated that the patient will be medically stable for discharge from the hospital within 2 midnights of admission.   Author: Jose Persia, MD 02/28/2023 5:39 PM  For on call review [email or web address].

## 2023-02-28 NOTE — ED Provider Notes (Signed)
Mri shows acute infarct, will admit to hospitalist service   Brenda Drafts, MD 02/28/23 [id number]

## 2023-02-28 NOTE — Assessment & Plan Note (Signed)
In the setting of medication noncompliance, as patient states she ran out approximately 3 days ago.  However on medication reconciliation, I am unable to find any recent refills.  Of note, patient also has persistent hypokalemia.  Given young age and hypokalemia, it would be prudent to evaluate for hyperaldosteronism  - Hold home antihypertensives to allow for permissive hypertension - Aldosterone/renin pending - As needed labetalol for SBP above 220 or DBP above 120

## 2023-02-28 NOTE — ED Notes (Signed)
Dr. Quinn Axe at bedside in triage at this time.

## 2023-02-28 NOTE — ED Notes (Signed)
Pt transported to room 124 at this time on cardiac monitoring. Pt ambulated from room to bathroom 1x assist prior to ED departure. All personal belongings removed and taken to inpatient floor at this time

## 2023-02-28 NOTE — ED Notes (Addendum)
Attending at bedside and notified in person of pt's current BP reading. Permissive hypertension plan so will not tx HTN currently.

## 2023-02-28 NOTE — ED Provider Notes (Signed)
[facility] Provider Note    Event Date/Time   First MD Initiated Contact with Patient 02/28/23 1306     (approximate)   History   Left-sided weakness  HPI  Brenda Todd is a 42 y.o. female reports previous history of acute coronary disease and cardiac stenting  Patient yesterday was seen and evaluated for left-sided weakness.  She reports that she was reading a book in the evening, suddenly noticed like she could not lift the book with her left hand.  She went to [facility] and was seen and evaluated for stroke.  She saw several physicians, does not know what all the results of her evaluations were and was discharged thereafter  She comes today as she reports she continues to have weakness on the left side of her body.  She has difficulty lifting her left hand and left leg.  She reports she had a droop in her left side of her face but that has since resolved.  She does report that she has not been taking her blood pressure medications which she was previously prescribed as she is run out   No chest pain.  No trouble breathing.  Patient reports she is very scared that she might of had a stroke.  Denies pregnancy.  Physical Exam   Triage Vital Signs: ED Triage Vitals  Enc Vitals Group     BP 02/28/23 1237 (!) 224/130     Pulse Rate 02/28/23 1237 (!) 106     Resp 02/28/23 1237 17     Temp 02/28/23 1237 98.5 F (36.9 C)     Temp Source 02/28/23 1237 Oral     SpO2 02/28/23 1237 98 %     Weight 02/28/23 1238 163 lb (73.9 kg)     Height --      Head Circumference --      Peak Flow --      Pain Score 02/28/23 1238 0     Pain Loc --      Pain Edu? --      Excl. in [place]? --     Most recent vital signs: Vitals:   02/28/23 1447 02/28/23 1453  BP:    Pulse:    Resp: (!) 21 (!) 21  Temp:    SpO2:       General: Awake, no distress.  Speech is clear.  No facial droop.  Extraocular movements normal except the patient reports slight double vision  with binocular vision that resolves with monocular vision assessment. CV:  Good peripheral perfusion.  Normal tones and rate Resp:  Normal effort.  Clear lungs bilaterally Abd:  No distention.  Soft nontender nondistended Other:   Patient with mild weakness to pronator drift testing in the left upper and left lower extremity.  Still able to lift past gravity but shows slight drift in left upper extremity.    No sensory deficit bilateral.  Strong right upper and right lower extremity.    ED Results / Procedures / Treatments   Labs (all labs ordered are listed, but only abnormal results are displayed) Labs Reviewed  CBC - Abnormal; Notable for the following components:      Result Value   WBC 12.0 (*)    RBC 6.03 (*)    Hemoglobin 15.7 (*)    HCT 48.9 (*)    All other components within normal limits  DIFFERENTIAL - Abnormal; Notable for the following components:   Neutro Abs 8.5 (*)  All other components within normal limits  COMPREHENSIVE METABOLIC PANEL - Abnormal; Notable for the following components:   Potassium 3.2 (*)    CO2 21 (*)    Glucose, Bld 111 (*)    AST 14 (*)    All other components within normal limits  URINE DRUG SCREEN, QUALITATIVE ([facility] ONLY) - Abnormal; Notable for the following components:   Cannabinoid 50 Ng, Ur [place] POSITIVE (*)    Benzodiazepine, Ur Scrn POSITIVE (*)    All other components within normal limits  PROTIME-INR  APTT  ETHANOL  PREGNANCY, URINE     EKG  And interpreted by me at 1350 heart rate 75 QRS 80 QTc 430 Normal sinus rhythm, no evidence of acute ischemia.  Suspect underlying left ventricular hypertrophy   RADIOLOGY  MRI pending. Dr. Corky Downs and Dr. Quinn Axe following   PROCEDURES:  Critical Care performed: No  Procedures   MEDICATIONS ORDERED IN ED: Medications  sodium chloride flush (NS) 0.9 % injection 3 mL (has no administration in time range)  LORazepam (ATIVAN) tablet 2 mg (2 mg Oral Given 02/28/23 1337)   amLODipine (NORVASC) tablet 10 mg (10 mg Oral Given 02/28/23 1451)  metoprolol tartrate (LOPRESSOR) tablet 25 mg (25 mg Oral Given 02/28/23 1451)     IMPRESSION / MDM / ASSESSMENT AND PLAN / ED COURSE  I reviewed the triage vital signs and the nursing notes.                              Case discussed in consult plan with Dr. Quinn Axe.  Dr. Quinn Axe is already seen and evaluated the patient, saw the patient in the triage area of the ER when she presented.  Is recommended patient receive repeat MRI which has been ordered.  Differential diagnosis includes, but is not limited to, possible stroke, mass lesion, MS, psychogenic, focal seizure, etc. patient not having a headache today.  Blood pressure is notably uncontrolled.  Patient reports she has run of her blood pressure medication.  No somnolence or headache which seem to argue against posterior reversible encephalopathy, not convincing that the patient has an acute hypertensive emergency at this point either given she has no headache and complaint of persistent left-sided deficit.  Query possible ischemic event stroke etc.  Appreciate consultation being performed by our neurology service Dr. Quinn Axe.  Currently awaiting MRI results  No cardiac symptoms or dyspnea.  No evidence of volume overload  Labs interpreted as mild hypokalemia.  Very mild leukocytosis.  Patient's presentation is most consistent with acute complicated illness / injury requiring diagnostic workup.   The patient is on the cardiac monitor to evaluate for evidence of arrhythmia and/or significant heart rate changes.  ----------------------------------------- 3:27 PM on 02/28/2023 -----------------------------------------  Ongoing care assigned to Dr. Corky Downs. Given the patient's repeat sensation and focal neurologic findings I would anticipate the need for admission but the patient is pending MRI and completion of neurology assessment by Dr. Quinn Axe.  Blood pressure being treated  here with the patient's home medications which she has run out of.  Anticipate reassessment and likely admission     FINAL CLINICAL IMPRESSION(S) / ED DIAGNOSES   Final diagnoses:  Left-sided muscle weakness     Rx / DC Orders   ED Discharge Orders     None        Note:  This document was prepared using Dragon voice recognition software and may include unintentional dictation errors.  Delman Kitten, MD 02/28/23 [phone]

## 2023-02-28 NOTE — ED Notes (Signed)
EKG to EDP [place] in person.  ?

## 2023-03-01 ENCOUNTER — Other Ambulatory Visit (HOSPITAL_COMMUNITY): Payer: Self-pay

## 2023-03-01 ENCOUNTER — Other Ambulatory Visit: Payer: Self-pay

## 2023-03-01 ENCOUNTER — Observation Stay: Admit: 2023-03-01 | Payer: Medicaid Other

## 2023-03-01 ENCOUNTER — Ambulatory Visit: Payer: Medicaid Other | Attending: Cardiology

## 2023-03-01 ENCOUNTER — Telehealth: Payer: Self-pay | Admitting: *Deleted

## 2023-03-01 ENCOUNTER — Encounter: Payer: Self-pay | Admitting: Internal Medicine

## 2023-03-01 ENCOUNTER — Ambulatory Visit: Payer: Medicaid Other

## 2023-03-01 ENCOUNTER — Observation Stay (HOSPITAL_BASED_OUTPATIENT_CLINIC_OR_DEPARTMENT_OTHER)
Admit: 2023-03-01 | Discharge: 2023-03-01 | Disposition: A | Discharge status: Home / Self Care | Payer: Medicaid Other | Attending: Internal Medicine | Admitting: Internal Medicine

## 2023-03-01 DIAGNOSIS — R531 Weakness: Secondary | ICD-10-CM

## 2023-03-01 DIAGNOSIS — I639 Cerebral infarction, unspecified: Secondary | ICD-10-CM

## 2023-03-01 DIAGNOSIS — E663 Overweight: Secondary | ICD-10-CM | POA: Insufficient documentation

## 2023-03-01 DIAGNOSIS — I6389 Other cerebral infarction: Secondary | ICD-10-CM

## 2023-03-01 DIAGNOSIS — E785 Hyperlipidemia, unspecified: Secondary | ICD-10-CM | POA: Insufficient documentation

## 2023-03-01 DIAGNOSIS — N200 Calculus of kidney: Secondary | ICD-10-CM | POA: Diagnosis not present

## 2023-03-01 DIAGNOSIS — I160 Hypertensive urgency: Secondary | ICD-10-CM | POA: Diagnosis not present

## 2023-03-01 DIAGNOSIS — E876 Hypokalemia: Secondary | ICD-10-CM | POA: Diagnosis not present

## 2023-03-01 LAB — BASIC METABOLIC PANEL
Anion gap: 7 (ref 5–15)
BUN: 14 mg/dL (ref 6–20)
CO2: 23 mmol/L (ref 22–32)
Calcium: 8.5 mg/dL — ABNORMAL LOW (ref 8.9–10.3)
Chloride: 105 mmol/L (ref 98–111)
Creatinine, Ser: 0.64 mg/dL (ref 0.44–1.00)
GFR, Estimated: >60 mL/min (ref >60)
Glucose, Bld: 98 mg/dL (ref 70–99)
Potassium: 3.2 mmol/L — ABNORMAL LOW (ref 3.5–5.1)
Sodium: 135 mmol/L (ref 135–145)

## 2023-03-01 LAB — CBC WITH DIFFERENTIAL/PLATELET
Abs Immature Granulocytes: 0.04 10*3/uL (ref 0.00–0.07)
Basophils Absolute: 0.1 10*3/uL (ref 0.0–0.1)
Basophils Relative: 1 %
Eosinophils Absolute: 0.1 10*3/uL (ref 0.0–0.5)
Eosinophils Relative: 1 %
HCT: 46.2 % — ABNORMAL HIGH (ref 36.0–46.0)
Hemoglobin: 15.3 g/dL — ABNORMAL HIGH (ref 12.0–15.0)
Immature Granulocytes: 0 %
Lymphocytes Relative: 35 %
Lymphs Abs: 3.7 10*3/uL (ref 0.7–4.0)
MCH: 26.3 pg (ref 26.0–34.0)
MCHC: 33.1 g/dL (ref 30.0–36.0)
MCV: 79.5 fL — ABNORMAL LOW (ref 80.0–100.0)
Monocytes Absolute: 0.6 10*3/uL (ref 0.1–1.0)
Monocytes Relative: 6 %
Neutro Abs: 6.2 10*3/uL (ref 1.7–7.7)
Neutrophils Relative %: 57 %
Platelets: 253 10*3/uL (ref 150–400)
RBC: 5.81 MIL/uL — ABNORMAL HIGH (ref 3.87–5.11)
RDW: 13.8 % (ref 11.5–15.5)
WBC: 10.8 10*3/uL — ABNORMAL HIGH (ref 4.0–10.5)
nRBC: 0 % (ref 0.0–0.2)

## 2023-03-01 LAB — ECHOCARDIOGRAM COMPLETE BUBBLE STUDY
AR max vel: 3.27 cm2
AV Area VTI: 3.01 cm2
AV Area mean vel: 3.06 cm2
AV Mean grad: 5 mmHg
AV Peak grad: 8.4 mmHg
Ao pk vel: 1.45 m/s
Area-P 1/2: 3.08 cm2
MV VTI: 3.46 cm2
S' Lateral: 2.5 cm

## 2023-03-01 LAB — CBG MONITORING, ED: Glucose-Capillary: 118 mg/dL — ABNORMAL HIGH (ref 70–99)

## 2023-03-01 LAB — GLUCOSE, CAPILLARY: Glucose-Capillary: 105 mg/dL — ABNORMAL HIGH (ref 70–99)

## 2023-03-01 LAB — MAGNESIUM: Magnesium: 2.2 mg/dL (ref 1.7–2.4)

## 2023-03-01 MED ORDER — CLOPIDOGREL BISULFATE 75 MG PO TABS
75.0000 mg | ORAL_TABLET | Freq: Every day | ORAL | 0 refills | Status: AC
  Filled 2023-03-01: qty 30, 30d supply, fill #0

## 2023-03-01 MED ORDER — POTASSIUM CHLORIDE CRYS ER 10 MEQ PO TBCR
10.0000 meq | EXTENDED_RELEASE_TABLET | Freq: Two times a day (BID) | ORAL | 0 refills | Status: AC
  Filled 2023-03-01: qty 14, 7d supply, fill #0

## 2023-03-01 MED ORDER — ATORVASTATIN CALCIUM 80 MG PO TABS
80.0000 mg | ORAL_TABLET | Freq: Every day | ORAL | 0 refills | Status: AC
  Filled 2023-03-01: qty 30, 30d supply, fill #0

## 2023-03-01 MED ORDER — CLOPIDOGREL BISULFATE 75 MG PO TABS
75.0000 mg | ORAL_TABLET | Freq: Every day | ORAL | 0 refills | Status: DC
  Filled 2023-03-01: qty 21, 21d supply, fill #0

## 2023-03-01 MED ORDER — LORAZEPAM 1 MG PO TABS
1.0000 mg | ORAL_TABLET | Freq: Four times a day (QID) | ORAL | Status: DC | PRN
  Administered 2023-03-01: 1 mg via ORAL

## 2023-03-01 MED ORDER — POTASSIUM CHLORIDE CRYS ER 20 MEQ PO TBCR
40.0000 meq | EXTENDED_RELEASE_TABLET | ORAL | Status: AC
  Administered 2023-03-01 (×2): 40 meq via ORAL
  Filled 2023-03-01 (×2): qty 2

## 2023-03-01 NOTE — Progress Notes (Signed)
Have issue with obtaining her antihypertensives medication, does not have insurance

## 2023-03-01 NOTE — Progress Notes (Signed)
*  PRELIMINARY RESULTS* Echocardiogram 2D Echocardiogram has been performed.  Sherrie Sport 03/01/2023, 7:49 AM

## 2023-03-01 NOTE — Progress Notes (Signed)
MD order received in [facility] to discharge pt home today; TOC previously listed PCP names for the pt to call and obtain a PCP; TOC also faxed information to the [facility] for out-pt rehab, per [facility] the [facility] will be contacting the pt for appointments; verbally reviewed AVS with pt, [place] for pt to pick up after discharge; pt to call and schedule follow-up appointments; Holter Monitor to be mailed to the pt's home in order for her to wear as written; Dr Donivan Scull office previously established this service after a visit in the pt's room; return to work letter printed and given to the pt (Return to Work on Friday, 03/04/23); no questions voiced at this time; pt discharged via wheelchair by nursing to the [place] entrance

## 2023-03-01 NOTE — Discharge Summary (Addendum)
Physician Discharge Summary   Patient: Brenda Todd MRN: [id number] DOB: 01/15/1981  Admit date:     02/28/2023  Discharge date: 03/01/23  Discharge Physician: Sharen Hones   PCP: Pcp, No   Recommendations at discharge:   [facility] will arrange for PCP follow-up in 1 to 2 weeks. Follow-up with neurology in 1 months. ZIO recorder arranged, follow-up with cardiology for results.  Discharge Diagnoses: Principal Problem:   Acute CVA (cerebrovascular accident) [facility]) Active Problems:   Hypertensive urgency   Coronary artery disease   Nephrolithiasis   Hypokalemia   Dyslipidemia   Overweight (BMI 25.0-29.9)  Resolved Problems:   * No resolved hospital problems. *  Hospital Course: Brenda Todd is a 42 y.o. female with medical history significant of hypertension, CAD s/p DES to rPDA and rPL1, nephrolithiasis, who presents to the ED due to left-sided weakness. 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  CT angiogram of the neck and head did not show significant occlusion.  Echocardiogram showed ejection fraction of 60 to 65%, no valvular disease.  Telemetry did not show any arrhythmia.  LDL is elevated at 127.  Patient was started on aspirin, Plavix as well as Lipitor. Condition improving, outpatient physical therapy has set up, ZIO recorder is arranged, will be mailed by cardiology to her home.  Currently she is medically stable to be discharged.   Assessment and Plan: * Acute CVA (cerebrovascular accident) ([place]) Dyslipidemia. Condition improving, seen by PT/OT, will be followed by outpatient physical therapy.  Continue aspirin, Plavix, Lipitor. Zio arranged, follow-up with neurology as well as PCP. Spoke with Dr. Quinn Axe, patient can continue aspirin and Plavix for the next 30 days, then decide by cardiology for mono treatment after seen by Dr. Rockey Situ.  Hypertensive urgency Resume amlodipine.  Coronary artery  disease Follow-up with cardiology as outpatient.  Will arrange follow-up with Dr. Rockey Situ in 1 months.  Nephrolithiasis CT kidney stone did not show any obstruction in the ureter, stone was within the kidney.  Follow-up with urology in 2 weeks.  Hypokalemia. Patient potassium was 3.2, give 80 mEq of oral potassium, followed by 20 mEq of KCl daily for 7 days.      Consultants: Neurology Procedures performed: None  Disposition: Home Diet recommendation:  Discharge Diet Orders (From admission, onward)     Start     Ordered   03/01/23 0000  Diet - low sodium heart healthy        03/01/23 1519           Cardiac diet DISCHARGE MEDICATION: Allergies as of 03/01/2023       Reactions   No [employer] Food Allergies Anaphylaxis, Other (See Comments)   Cherries   Penicillins Anaphylaxis, Hives   Reglan [metoclopramide] Anxiety        Medication List     STOP taking these medications    budesonide 32 MCG/ACT nasal spray Commonly known as: EQ Budesonide Nasal   ibuprofen 600 MG tablet Commonly known as: ADVIL   isosorbide mononitrate 30 MG 24 hr tablet Commonly known as: IMDUR   levocetirizine 5 MG tablet Commonly known as: XYZAL   metoprolol tartrate 25 MG tablet Commonly known as: LOPRESSOR   ondansetron 8 MG disintegrating tablet Commonly known as: ZOFRAN-ODT   tamsulosin 0.4 MG Caps capsule Commonly known as: FLOMAX       TAKE these medications    amLODipine 10 MG tablet Commonly known as: NORVASC  Take 1 tablet (10 mg total) by mouth daily.   aspirin 81 MG chewable tablet Chew 1 tablet (81 mg total) by mouth daily.   atorvastatin 80 MG tablet Commonly known as: LIPITOR Take 1 tablet (80 mg total) by mouth daily.   clopidogrel 75 MG tablet Commonly known as: PLAVIX Take 1 tablet (75 mg total) by mouth daily for 21 days. Start taking on: March 02, 2023   potassium chloride 10 MEQ tablet Commonly known as: KLOR-CON M Take 1 tablet (10 mEq  total) by mouth 2 (two) times daily for 7 days.        Follow-up Information     [facility] Follow up in 1 week(s).   Contact information: [place] [phone]               Discharge Exam: Danley Danker Weights   02/28/23 1238 02/28/23 2120  Weight: 73.9 kg 75.4 kg   General exam: Appears calm and comfortable  Respiratory system: Clear to auscultation. Respiratory effort normal. Cardiovascular system: S1 & S2 heard, RRR. No JVD, murmurs, rubs, gallops or clicks. No pedal edema. Gastrointestinal system: Abdomen is nondistended, soft and nontender. No organomegaly or masses felt. Normal bowel sounds heard. Central nervous system: Alert and oriented. Left sided weakness Extremities: Symmetric 5 x 5 power. Skin: No rashes, lesions or ulcers Psychiatry: Judgement and insight appear normal. Mood & affect appropriate.    Condition at discharge: good  The results of significant diagnostics from this hospitalization (including imaging, microbiology, ancillary and laboratory) are listed below for reference.   Imaging Studies: ECHOCARDIOGRAM COMPLETE BUBBLE STUDY  Result Date: 03/01/2023    ECHOCARDIOGRAM REPORT   Patient Name:   Brenda Todd Date of Exam: 03/01/2023 Medical Rec #:  [id number]       Height:       63.0 in Accession #:    [id number]      Weight:       166.2 lb Date of Birth:  May 30, 1981        BSA:          1.787 m Patient Age:    42 years        BP:           181/109 mmHg Patient Gender: F               HR:           78 bpm. Exam Location:  [facility] Procedure: 2D Echo, Cardiac Doppler, Color Doppler and Saline Contrast Bubble            Study Indications:     Stroke 434.91 / I63.9  History:         Patient has prior history of Echocardiogram examinations, most                  recent 05/20/2022. Previous Myocardial Infarction; Risk                  Factors:Hypertension.  Sonographer:     Sherrie Sport  Referring Phys:  [id number] Jose Persia Diagnosing Phys: Kathlyn Sacramento MD IMPRESSIONS  1. Left ventricular ejection fraction, by estimation, is 60 to 65%. The left ventricle has normal function. The left ventricle has no regional wall motion abnormalities. There is mild left ventricular hypertrophy. Left ventricular diastolic parameters were normal.  2. Right ventricular systolic function is normal. The right ventricular size is normal. Tricuspid regurgitation signal is inadequate  for assessing PA pressure.  3. The mitral valve is normal in structure. No evidence of mitral valve regurgitation. No evidence of mitral stenosis.  4. The aortic valve is normal in structure. Aortic valve regurgitation is not visualized. No aortic stenosis is present.  5. The inferior vena cava is normal in size with greater than 50% respiratory variability, suggesting right atrial pressure of 3 mmHg.  6. Agitated saline contrast bubble study was negative, with no evidence of any interatrial shunt. FINDINGS  Left Ventricle: Left ventricular ejection fraction, by estimation, is 60 to 65%. The left ventricle has normal function. The left ventricle has no regional wall motion abnormalities. The left ventricular internal cavity size was normal in size. There is  mild left ventricular hypertrophy. Left ventricular diastolic parameters were normal. Right Ventricle: The right ventricular size is normal. No increase in right ventricular wall thickness. Right ventricular systolic function is normal. Tricuspid regurgitation signal is inadequate for assessing PA pressure. The tricuspid regurgitant velocity is 1.75 m/s, and with an assumed right atrial pressure of 3 mmHg, the estimated right ventricular systolic pressure is [id number] mmHg. Left Atrium: Left atrial size was normal in size. Right Atrium: Right atrial size was normal in size. Pericardium: There is no evidence of pericardial effusion. Mitral Valve: The mitral valve is normal in structure. No  evidence of mitral valve regurgitation. No evidence of mitral valve stenosis. MV peak gradient, 4.0 mmHg. The mean mitral valve gradient is 1.0 mmHg. Tricuspid Valve: The tricuspid valve is normal in structure. Tricuspid valve regurgitation is not demonstrated. No evidence of tricuspid stenosis. Aortic Valve: The aortic valve is normal in structure. Aortic valve regurgitation is not visualized. No aortic stenosis is present. Aortic valve mean gradient measures 5.0 mmHg. Aortic valve peak gradient measures 8.4 mmHg. Aortic valve area, by VTI measures 3.01 cm. Pulmonic Valve: The pulmonic valve was normal in structure. Pulmonic valve regurgitation is not visualized. No evidence of pulmonic stenosis. Aorta: The aortic root is normal in size and structure. Venous: The inferior vena cava is normal in size with greater than 50% respiratory variability, suggesting right atrial pressure of 3 mmHg. IAS/Shunts: No atrial level shunt detected by color flow Doppler. Agitated saline contrast was given intravenously to evaluate for intracardiac shunting. Agitated saline contrast bubble study was negative, with no evidence of any interatrial shunt.  LEFT VENTRICLE PLAX 2D LVIDd:         4.00 cm   Diastology LVIDs:         2.50 cm   LV e' medial:    7.18 cm/s LV PW:         1.10 cm   LV E/e' medial:  9.0 LV IVS:        1.20 cm   LV e' lateral:   7.83 cm/s LVOT diam:     2.00 cm   LV E/e' lateral: 8.2 LV SV:         82 LV SV Index:   46 LVOT Area:     3.14 cm  RIGHT VENTRICLE RV Basal diam:  2.70 cm RV Mid diam:    1.90 cm LEFT ATRIUM             Index        RIGHT ATRIUM          Index LA diam:        2.40 cm 1.34 cm/m   RA Area:     7.09 cm LA Vol (A2C):   30.5 ml  17.06 ml/m  RA Volume:   8.15 ml  4.56 ml/m LA Vol (A4C):   23.2 ml 12.98 ml/m LA Biplane Vol: 26.8 ml 14.99 ml/m  AORTIC VALVE AV Area (Vmax):    3.27 cm AV Area (Vmean):   3.06 cm AV Area (VTI):     3.01 cm AV Vmax:           145.00 cm/s AV Vmean:           97.900 cm/s AV VTI:            0.272 m AV Peak Grad:      8.4 mmHg AV Mean Grad:      5.0 mmHg LVOT Vmax:         151.00 cm/s LVOT Vmean:        95.400 cm/s LVOT VTI:          0.261 m LVOT/AV VTI ratio: 0.96  AORTA Ao Root diam: 3.20 cm MITRAL VALVE               TRICUSPID VALVE MV Area (PHT): 3.08 cm    TR Peak grad:   12.2 mmHg MV Area VTI:   3.46 cm    TR Vmax:        175.00 cm/s MV Peak grad:  4.0 mmHg MV Mean grad:  1.0 mmHg    SHUNTS MV Vmax:       1.00 m/s    Systemic VTI:  0.26 m MV Vmean:      55.8 cm/s   Systemic Diam: 2.00 cm MV Decel Time: 246 msec MV E velocity: 64.50 cm/s MV A velocity: 68.10 cm/s MV E/A ratio:  0.95 Kathlyn Sacramento MD Electronically signed by Kathlyn Sacramento MD Signature Date/Time: 03/01/2023/12:00:25 PM    Final    CT ANGIO HEAD NECK W WO CM  Result Date: 02/28/2023 CLINICAL DATA:  Neuro deficit, acute, stroke suspected. Left-sided weakness since Sunday morning. EXAM: CT HEAD WITHOUT CONTRAST CT ANGIOGRAPHY OF THE HEAD AND NECK TECHNIQUE: Contiguous axial images were obtained from the base of the skull through the vertex without intravenous contrast. Multidetector CT imaging of the head and neck was performed using the standard protocol during bolus administration of intravenous contrast. Multiplanar CT image reconstructions and MIPs were obtained to evaluate the vascular anatomy. Carotid stenosis measurements (when applicable) are obtained utilizing NASCET criteria, using the distal internal carotid diameter as the denominator. RADIATION DOSE REDUCTION: This exam was performed according to the departmental dose-optimization program which includes automated exposure control, adjustment of the mA and/or kV according to patient size and/or use of iterative reconstruction technique. CONTRAST:  79mL OMNIPAQUE IOHEXOL 350 MG/ML SOLN COMPARISON:  MRI brain 02/28/2023. FINDINGS: CT HEAD Brain: Asymmetric hypoattenuation in the posterior limb of the right internal capsule, corresponds to  acute infarcts seen on same day brain MRI. No acute intracranial hemorrhage, mass effect or midline shift. No hydrocephalus or extra-axial collection. Vascular: No hyperdense vessel or unexpected calcification. Skull: No calvarial fracture or suspicious bone lesion. Skull base is unremarkable. Sinuses/Orbits: Unremarkable. CTA NECK Aortic arch: Three-vessel arch configuration. Arch vessel origins are patent. Right carotid system: The common and internal carotid arteries are patent to the skull base without stenosis, aneurysm or dissection. Mild calcified plaque at the right carotid bulb. Left carotid system: The common and internal carotid arteries are patent to the skull base without stenosis, aneurysm or dissection. Mild soft plaque at the left carotid bulb. Vertebral arteries:Right dominant vertebral artery. Patent from the origin to  the confluence with the basilar without stenosis or dissection. Skeleton: No suspicious bone lesions. Other neck: Unremarkable. CTA HEAD Anterior circulation: Intracranial ICAs are patent without stenosis or aneurysm. The proximal ACAs and MCAs are patent without stenosis or aneurysm. Distal branches are symmetric. Posterior circulation: Normal basilar artery. The SCAs, AICAs and PICAs are patent proximally. The PCAs are patent proximally without stenosis or aneurysm. Distal branches are symmetric. Venous sinuses: Early phase of contrast. Anatomic variants: None. IMPRESSION: 1. Asymmetric hypoattenuation in the posterior limb of the right internal capsule, corresponds to acute infarct seen on same day brain MRI. 2. No acute intracranial hemorrhage. 3. No large vessel occlusion or significant stenosis of the head or neck vessels. 4. Mild plaque at the carotid bifurcations. Electronically Signed   By: Emmit Alexanders M.D.   On: 02/28/2023 18:49   CT RENAL STONE STUDY  Result Date: 02/28/2023 CLINICAL DATA:  Flank pain left-sided weakness EXAM: CT ABDOMEN AND PELVIS WITHOUT CONTRAST  TECHNIQUE: Multidetector CT imaging of the abdomen and pelvis was performed following the standard protocol without IV contrast. RADIATION DOSE REDUCTION: This exam was performed according to the departmental dose-optimization program which includes automated exposure control, adjustment of the mA and/or kV according to patient size and/or use of iterative reconstruction technique. COMPARISON:  CT 11/11/2022 FINDINGS: Lower chest: No acute abnormality. Hepatobiliary: No focal liver abnormality is seen. No gallstones, gallbladder wall thickening, or biliary dilatation. Pancreas: Unremarkable. No pancreatic ductal dilatation or surrounding inflammatory changes. Spleen: Normal in size without focal abnormality. Adrenals/Urinary Tract: Adrenal glands are normal. No hydronephrosis. Punctate nonobstructing left kidney stone. Bladder is normal Stomach/Bowel: Stomach is within normal limits. Status post appendectomy. No evidence of bowel wall thickening, distention, or inflammatory changes. Vascular/Lymphatic: No significant vascular findings are present. No enlarged abdominal or pelvic lymph nodes. Mild aortic atherosclerosis. Reproductive: Uterus and bilateral adnexa are unremarkable. Other: No abdominal wall hernia or abnormality. No abdominopelvic ascites. Musculoskeletal: No acute or significant osseous findings. IMPRESSION: 1. Punctate nonobstructing left kidney stone. No hydronephrosis or ureteral stone. 2. Aortic atherosclerosis. Aortic Atherosclerosis (ICD10-I70.0). Electronically Signed   By: Donavan Foil M.D.   On: 02/28/2023 18:37   MR BRAIN WO CONTRAST  Result Date: 02/28/2023 CLINICAL DATA:  Neuro deficit with acute stroke suspected. Left-sided weakness since Sunday morning. EXAM: MRI HEAD WITHOUT CONTRAST TECHNIQUE: Multiplanar, multiecho pulse sequences of the brain and surrounding structures were obtained without intravenous contrast. COMPARISON:  Yesterday FINDINGS: Brain: Ovoid acute infarct at the  posterior limb of the right internal capsule measuring 11 mm. No acute hemorrhage, hydrocephalus, or masslike finding. No hydrocephalus or collection. There are a few small areas of FLAIR hyperintensity in the cerebral white matter, likely small vessel ischemic insults given the acute finding. There may have been a prior lacunar infarct at the right caudate head which appears blunted along the lateral ventricle. No abnormal mineralization. Vascular: Major flow voids are preserved Skull and upper cervical spine: Normal marrow signal Sinuses/Orbits: Unremarkable IMPRESSION: 11 mm acute infarct at the posterior limb right internal capsule. Electronically Signed   By: Jorje Guild M.D.   On: 02/28/2023 15:53   MR BRAIN WO CONTRAST  Result Date: 02/27/2023 CLINICAL DATA:  Initial evaluation for neuro deficit, stroke. EXAM: MRI HEAD WITHOUT CONTRAST TECHNIQUE: Multiplanar, multiecho pulse sequences of the brain and surrounding structures were obtained without intravenous contrast. COMPARISON:  Prior CT from earlier the same day. FINDINGS: Brain: This is a limited examination with axial DWI sequence only performed. Provided images are degraded  by motion artifact. Diffusion-weighted imaging demonstrates no definite or convincing foci of acute or subacute ischemia. Note made of an apparent punctate focus of mild diffusion signal abnormality at the right thalamus (series 5, image 80), not convincingly seen on corresponding ADC correlate, and favored to be artifactual. Gray-white matter differentiation grossly maintained. No visible areas of chronic cortical infarction. No visible mass lesion, mass effect or midline shift. No hydrocephalus or extra-axial fluid collection. Vascular: Not assessed on this limited exam. Skull and upper cervical spine: Not assessed on this limited exam. Sinuses/Orbits: Not assessed on this limited exam. Other: None. IMPRESSION: 1. Motion degraded exam. 2. No definite acute intracranial  infarct or other abnormality. Electronically Signed   By: Jeannine Boga M.D.   On: 02/27/2023 03:06   CT HEAD CODE STROKE WO CONTRAST  Result Date: 02/27/2023 CLINICAL DATA:  Code stroke. Initial evaluation for neuro deficit, stroke. EXAM: CT HEAD WITHOUT CONTRAST TECHNIQUE: Contiguous axial images were obtained from the base of the skull through the vertex without intravenous contrast. RADIATION DOSE REDUCTION: This exam was performed according to the departmental dose-optimization program which includes automated exposure control, adjustment of the mA and/or kV according to patient size and/or use of iterative reconstruction technique. COMPARISON:  Prior CT from 05/18/2022. FINDINGS: Brain: Cerebral volume within normal limits. No acute intracranial hemorrhage. No acute large vessel territory infarct. No mass lesion or midline shift. No hydrocephalus or extra-axial fluid collection. Vascular: No convincing asymmetric hyperdense vessel. Skull: Scalp soft tissues and calvarium within normal limits. Sinuses/Orbits: Globes normal soft tissues within normal limits. Small amount of secretions noted within the left sphenoid sinus. Paranasal sinuses are otherwise clear. No mastoid effusion. Other: None. ASPECTS [facility] Stroke Program Early CT Score) - Ganglionic level infarction (caudate, lentiform nuclei, internal capsule, insula, M1-M3 cortex): 7 - Supraganglionic infarction (M4-M6 cortex): 3 Total score (0-10 with 10 being normal): 10 IMPRESSION: 1. Negative head CT.  No acute intracranial abnormality. 2. ASPECTS is 10. These results were communicated to Dr. Leonel Ramsay at 2:51 am on 02/27/2023 by text page via the [facility] messaging system. Electronically Signed   By: Jeannine Boga M.D.   On: 02/27/2023 02:51    Microbiology: Results for orders placed or performed during the hospital encounter of 04/28/21  Resp Panel by RT-PCR (Flu A&B, Covid) Nasopharyngeal Swab     Status: None   Collection Time:  04/28/21  2:26 PM   Specimen: Nasopharyngeal Swab; Nasopharyngeal(NP) swabs in vial transport medium  Result Value Ref Range Status   SARS Coronavirus 2 by RT PCR NEGATIVE NEGATIVE Final    Comment: (NOTE) SARS-CoV-2 target nucleic acids are NOT DETECTED.  The SARS-CoV-2 RNA is generally detectable in upper respiratory specimens during the acute phase of infection. The lowest concentration of SARS-CoV-2 viral copies this assay can detect is 138 copies/mL. A negative result does not preclude SARS-Cov-2 infection and should not be used as the sole basis for treatment or other patient management decisions. A negative result may occur with  improper specimen collection/handling, submission of specimen other than nasopharyngeal swab, presence of viral mutation(s) within the areas targeted by this assay, and inadequate number of viral copies(<138 copies/mL). A negative result must be combined with clinical observations, patient history, and epidemiological information. The expected result is Negative.  Fact Sheet for Patients:  EntrepreneurPulse.com.au  Fact Sheet for Healthcare Providers:  IncredibleEmployment.be  This test is no t yet approved or cleared by the [place] FDA and  has been authorized for detection and/or diagnosis  of SARS-CoV-2 by FDA under an Emergency Use Authorization (EUA). This EUA will remain  in effect (meaning this test can be used) for the duration of the COVID-19 declaration under Section 564(b)(1) of the Act, 21 U.S.C.section 360bbb-3(b)(1), unless the authorization is terminated  or revoked sooner.       Influenza A by PCR NEGATIVE NEGATIVE Final   Influenza B by PCR NEGATIVE NEGATIVE Final    Comment: (NOTE) The Xpert Xpress SARS-CoV-2/FLU/RSV plus assay is intended as an aid in the diagnosis of influenza from Nasopharyngeal swab specimens and should not be used as a sole basis for treatment. Nasal washings  and aspirates are unacceptable for Xpert Xpress SARS-CoV-2/FLU/RSV testing.  Fact Sheet for Patients: EntrepreneurPulse.com.au  Fact Sheet for Healthcare Providers: IncredibleEmployment.be  This test is not yet approved or cleared by the [place] FDA and has been authorized for detection and/or diagnosis of SARS-CoV-2 by FDA under an Emergency Use Authorization (EUA). This EUA will remain in effect (meaning this test can be used) for the duration of the COVID-19 declaration under Section 564(b)(1) of the Act, 21 U.S.C. section 360bbb-3(b)(1), unless the authorization is terminated or revoked.  Performed at [facility], [address] [place], [place] [address]   Urine Culture     Status: Abnormal   Collection Time: 04/28/21  8:00 PM   Specimen: Urine, Random  Result Value Ref Range Status   Specimen Description   Final    URINE, RANDOM Performed at [facility], [address] [place], [place] [address]    Special Requests   Final    NONE Performed at [facility], [address] [place], [place] [address]    Culture MULTIPLE SPECIES PRESENT, SUGGEST RECOLLECTION (A)  Final   Report Status 04/30/2021 FINAL  Final    Labs: CBC: Recent Labs  Lab 02/27/23 0226 02/27/23 0229 02/28/23 1247 03/01/23 0449  WBC  --  11.6* 12.0* 10.8*  NEUTROABS  --  7.5 8.5* 6.2  HGB 14.3 14.3 15.7* 15.3*  HCT 42.0 42.8 48.9* 46.2*  MCV  --  80.6 81.1 79.5*  PLT  --  260 295 [id number]   Basic Metabolic Panel: Recent Labs  Lab 02/27/23 0226 02/27/23 0229 02/28/23 1247 03/01/23 0449  NA 142 139 139 135  K 3.3* 3.2* 3.2* 3.2*  CL 102 105 105 105  CO2  --  26 21* 23  GLUCOSE 109* 108* 111* 98  BUN 12 12 12 14   CREATININE 0.70 0.75 0.66 0.64  CALCIUM  --  8.9 9.0 8.5*  MG  --   --   --  2.2   Liver Function Tests: Recent Labs  Lab 02/27/23 0229 02/28/23 1247  AST 14* 14*  ALT 15 15  ALKPHOS 50 54   BILITOT 0.5 0.9  PROT 6.5 8.1  ALBUMIN 3.6 4.4   CBG: Recent Labs  Lab 02/27/23 0217 02/28/23 2126  GLUCAP 118* 105*    Discharge time spent: greater than 30 minutes.  Signed: Sharen Hones, MD [facility] 03/01/2023

## 2023-03-01 NOTE — Telephone Encounter (Signed)
Spoke with patient and reviewed recommendations and orders for 2 live heart monitors to wear for a full 30 days. Instructed patient that they would both come through the mail with all instructions on how to place them. She verbalized understanding, confirmed address, and had no further questions at this time.

## 2023-03-01 NOTE — Progress Notes (Addendum)
SLP Cancellation Note  Patient Details Name: HUONG BUTTRY MRN: [id number] DOB: 1981/04/08   Cancelled treatment:       Reason Eval/Treat Not Completed: SLP screened, no needs identified, will sign off. Consult received and appreciated. Met w/ pt in room. Pt sitting up in bed w/ tray table next to her upon ST arrival. Pt engaged in appropriate conversation w/ ST regarding how she was feeling and current book she is reading. MD arrived during screening and pt independently communicated wants/needs including medical needs/issues. Pt expressed no overt difficulties swallowing at this time. Pt's speech fluent, 100% intelligible, and w/ appropriate pragmatics. NSG reported no communication issues as well. Pt encouraged to reach out to PCP if she noticed any new changes in ADLs after d/c.  No ST needs at this time. ST services will s/o. MD to reconsult if any new needs arise during admission.  Randall Hiss Graduate Clinician [place], Speech Pathology   Randall Hiss 03/01/2023, 9:24 AM

## 2023-03-01 NOTE — Hospital Course (Signed)
Brenda Todd is a 42 y.o. female with medical history significant of hypertension, CAD s/p DES to rPDA and rPL1, nephrolithiasis, who presents to the ED due to left-sided weakness. 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  CT angiogram of the neck and head did not show significant occlusion.  Echocardiogram showed ejection fraction of 60 to 65%, no valvular disease.  Telemetry did not show any arrhythmia.  LDL is elevated at 127.  Patient was started on aspirin, Plavix as well as Lipitor. Condition improving, outpatient physical therapy has set up, ZIO recorder is arranged, will be mailed by cardiology to her home.  Currently she is medically stable to be discharged.

## 2023-03-01 NOTE — Plan of Care (Signed)
Problem: Education: Goal: Knowledge of disease or condition will improve 03/01/2023 1648 by Oris Drone, RN Outcome: Adequate for Discharge 03/01/2023 1203 by Oris Drone, RN Outcome: Progressing Goal: Knowledge of secondary prevention will improve (MUST DOCUMENT ALL) 03/01/2023 1648 by Oris Drone, RN Outcome: Adequate for Discharge 03/01/2023 1203 by Oris Drone, RN Outcome: Progressing Goal: Knowledge of patient specific risk factors will improve Elta [place] N/A or DELETE if not current risk factor) 03/01/2023 1648 by Oris Drone, RN Outcome: Adequate for Discharge 03/01/2023 1203 by Oris Drone, RN Outcome: Progressing   Problem: Ischemic Stroke/TIA Tissue Perfusion: Goal: Complications of ischemic stroke/TIA will be minimized 03/01/2023 1648 by Oris Drone, RN Outcome: Adequate for Discharge 03/01/2023 1203 by Oris Drone, RN Outcome: Progressing   Problem: Coping: Goal: Will verbalize positive feelings about self 03/01/2023 1648 by Oris Drone, RN Outcome: Adequate for Discharge 03/01/2023 1203 by Oris Drone, RN Outcome: Progressing Goal: Will identify appropriate support needs 03/01/2023 1648 by Oris Drone, RN Outcome: Adequate for Discharge 03/01/2023 1203 by Oris Drone, RN Outcome: Progressing   Problem: Health Behavior/Discharge Planning: Goal: Ability to manage health-related needs will improve 03/01/2023 1648 by Oris Drone, RN Outcome: Adequate for Discharge 03/01/2023 1203 by Oris Drone, RN Outcome: Progressing Goal: Goals will be collaboratively established with patient/family 03/01/2023 1648 by Oris Drone, RN Outcome: Adequate for Discharge 03/01/2023 1203 by Oris Drone, RN Outcome: Progressing   Problem: Self-Care: Goal: Ability to participate in self-care as condition permits will improve 03/01/2023 1648 by Oris Drone, RN Outcome: Adequate for  Discharge 03/01/2023 1203 by Oris Drone, RN Outcome: Progressing Goal: Verbalization of feelings and concerns over difficulty with self-care will improve 03/01/2023 1648 by Oris Drone, RN Outcome: Adequate for Discharge 03/01/2023 1203 by Oris Drone, RN Outcome: Progressing Goal: Ability to communicate needs accurately will improve 03/01/2023 1648 by Oris Drone, RN Outcome: Adequate for Discharge 03/01/2023 1203 by Oris Drone, RN Outcome: Progressing   Problem: Nutrition: Goal: Risk of aspiration will decrease 03/01/2023 1648 by Oris Drone, RN Outcome: Adequate for Discharge 03/01/2023 1203 by Oris Drone, RN Outcome: Progressing Goal: Dietary intake will improve 03/01/2023 1648 by Oris Drone, RN Outcome: Adequate for Discharge 03/01/2023 1203 by Oris Drone, RN Outcome: Progressing   Problem: Education: Goal: Knowledge of General Education information will improve Description: Including pain rating scale, medication(s)/side effects and non-pharmacologic comfort measures 03/01/2023 1648 by Oris Drone, RN Outcome: Adequate for Discharge 03/01/2023 1203 by Oris Drone, RN Outcome: Progressing   Problem: Health Behavior/Discharge Planning: Goal: Ability to manage health-related needs will improve 03/01/2023 1648 by Oris Drone, RN Outcome: Adequate for Discharge 03/01/2023 1203 by Oris Drone, RN Outcome: Progressing   Problem: Clinical Measurements: Goal: Ability to maintain clinical measurements within normal limits will improve 03/01/2023 1648 by Oris Drone, RN Outcome: Adequate for Discharge 03/01/2023 1203 by Oris Drone, RN Outcome: Progressing Goal: Will remain free from infection 03/01/2023 1648 by Oris Drone, RN Outcome: Adequate for Discharge 03/01/2023 1203 by Oris Drone, RN Outcome: Progressing Goal: Diagnostic test results will  improve 03/01/2023 1648 by Oris Drone, RN Outcome: Adequate for Discharge 03/01/2023 1203 by Oris Drone, RN Outcome: Progressing Goal: Respiratory complications will improve 03/01/2023 1648 by Oris Drone, RN Outcome: Adequate for Discharge 03/01/2023 1203 by Oris Drone, RN Outcome: Progressing Goal: Cardiovascular complication will be avoided  03/01/2023 1648 by Oris Drone, RN Outcome: Adequate for Discharge 03/01/2023 1203 by Oris Drone, RN Outcome: Progressing   Problem: Activity: Goal: Risk for activity intolerance will decrease 03/01/2023 1648 by Oris Drone, RN Outcome: Adequate for Discharge 03/01/2023 1203 by Oris Drone, RN Outcome: Progressing   Problem: Nutrition: Goal: Adequate nutrition will be maintained 03/01/2023 1648 by Oris Drone, RN Outcome: Adequate for Discharge 03/01/2023 1203 by Oris Drone, RN Outcome: Progressing   Problem: Coping: Goal: Level of anxiety will decrease 03/01/2023 1648 by Oris Drone, RN Outcome: Adequate for Discharge 03/01/2023 1203 by Oris Drone, RN Outcome: Progressing   Problem: Elimination: Goal: Will not experience complications related to bowel motility 03/01/2023 1648 by Oris Drone, RN Outcome: Adequate for Discharge 03/01/2023 1203 by Oris Drone, RN Outcome: Progressing Goal: Will not experience complications related to urinary retention 03/01/2023 1648 by Oris Drone, RN Outcome: Adequate for Discharge 03/01/2023 1203 by Oris Drone, RN Outcome: Progressing   Problem: Pain Managment: Goal: General experience of comfort will improve 03/01/2023 1648 by Oris Drone, RN Outcome: Adequate for Discharge 03/01/2023 1203 by Oris Drone, RN Outcome: Progressing   Problem: Safety: Goal: Ability to remain free from injury will improve 03/01/2023 1648 by Oris Drone, RN Outcome: Adequate for  Discharge 03/01/2023 1203 by Oris Drone, RN Outcome: Progressing   Problem: Skin Integrity: Goal: Risk for impaired skin integrity will decrease 03/01/2023 1648 by Oris Drone, RN Outcome: Adequate for Discharge 03/01/2023 1203 by Oris Drone, RN Outcome: Progressing

## 2023-03-01 NOTE — Progress Notes (Signed)
  Chaplain On-Call responded to Spiritual Care Consult Order from RN Alric Ran. The Consult request stated "patient very emotional over diagnosis of CVA this morning!"  Chaplain attempted to visit at 1000, but the patient was unavailable. At 1115, Chaplain met the patient, who stated that she is very tired and wants to rest. She requested a visit later today.  Chaplain will refer to the Afternoon Chaplain for follow-up in order to complete the Consult Order.  Chaplain Pollyann Samples M.Div., [facility]

## 2023-03-01 NOTE — Progress Notes (Signed)
Patient arrived to unit from ED on bed, alert and orientated x 4. No sign of respiratory distress observed. Oriented to room and call bell. Made comfortable in bed. Iv intact. Ambulated to the bathroom without assistance.

## 2023-03-01 NOTE — Discharge Instructions (Addendum)
Some PCP options in [place] area- not a comprehensive list  [place]- [phone] [facility]- [place]- [phone]  or [facility] Physician Referral Line [phone]       Heart Monitor:   Length of Wear: 30 days   Your monitor will be mailed to your home address within 3-5 business days. However, if you have not received your monitor after 5 business days please send [place] a MyChart message or call the office at [phone], so we may follow up on this for you.    Your physician has recommended that you wear a Zio AT monitor.    This monitor is a medical device that records the heart's electrical activity. Doctors most often use these monitors to diagnose arrhythmias. Arrhythmias are problems with the speed or rhythm of the heartbeat. The monitor is a small device applied to your chest. You can wear one while you do your normal daily activities. While wearing this monitor if you have any symptoms to push the button and record what you felt. Once you have worn this monitor for the period of time provider prescribed (Usually 14 days), you will return the monitor device in the postage paid box. Once it is returned they will download the data collected and provide [place] with a report which the provider will then review and we will call you with those results. Important tips:   1. Avoid showering during the first 24 hours of wearing the monitor. 2. Avoid excessive sweating to help maximize wear time. 3. Do not submerge the device, no hot tubs, and no swimming pools. 4. Keep any lotions or oils away from the patch. 5. After 24 hours you may shower with the patch on. Take brief showers with your back facing the shower head.  6. Do not remove patch once it has been placed because that will interrupt data and decrease adhesive wear time. 7. Push the button when you have any  symptoms and write down what you were feeling. 8. Once you have completed wearing your monitor, remove and place into box which has postage paid and place in your outgoing mailbox.  9. If for some reason you have misplaced your box then call our office and we can provide another box and/or mail it off for you.

## 2023-03-01 NOTE — Evaluation (Signed)
Occupational Therapy Evaluation Patient Details Name: Brenda Todd MRN: [id number] DOB: [birth year] Today's Date: 03/01/2023   History of Present Illness Patient is a 42 year old female with chronic HTN, prior MI, kidney stones who presents with worsening left side weakness. 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   Clinical Impression   Patient presenting with decreased Ind in self care,balance, functional mobility/transfers, endurance, safety awareness, and L UE/LE  weakness and coordination deficit. Patient reports living with family and being Ind at baseline and working full time. Patient currently functioning at supervision for functional mobility. L UE with decreased sensation but movement against gravity. Focus on incorporating L UE in functional tasks with weight bearing and hand over hand assistance while brushing teeth, drinking from cup, and applying deodorant. Patient will benefit from acute OT to increase overall independence in the areas of ADLs, functional mobility, and safety awareness in order to safely discharge.     Recommendations for follow up therapy are one component of a multi-disciplinary discharge planning process, led by the attending physician.  Recommendations may be updated based on patient status, additional functional criteria and insurance authorization.   Assistance Recommended at Discharge Intermittent Supervision/Assistance  Patient can return home with the following A little help with walking and/or transfers;A little help with bathing/dressing/bathroom;Assistance with cooking/housework;Assist for transportation;Help with stairs or ramp for entrance    Functional Status Assessment  Patient has had a recent decline in their functional status and demonstrates the ability to make significant improvements in function in a reasonable and predictable amount of time.  Equipment Recommendations  None recommended by OT     Recommendations for Other Services       Precautions / Restrictions Precautions Precautions: Fall Restrictions Weight Bearing Restrictions: No      Mobility Bed Mobility Overal bed mobility: Modified Independent             General bed mobility comments: increased time and effort required    Transfers Overall transfer level: Needs assistance Equipment used: None Transfers: Sit to/from Stand Sit to Stand: Supervision                  Balance Overall balance assessment: Needs assistance Sitting-balance support: Feet supported Sitting balance-Leahy Scale: Good     Standing balance support: No upper extremity supported Standing balance-Leahy Scale: Fair                             ADL either performed or assessed with clinical judgement   ADL Overall ADL's : Needs assistance/impaired     Grooming: Oral care;Standing;Supervision/safety                   Toilet Transfer: Copy Details (indicate cue type and reason): simulated                 Vision Patient Visual Report: No change from baseline              Pertinent Vitals/Pain Pain Assessment Pain Assessment: Faces Faces Pain Scale: Hurts a little bit Pain Location: headache Pain Descriptors / Indicators: Headache Pain Intervention(s): Monitored during session, Premedicated before session, Repositioned     Hand Dominance Right   Extremity/Trunk Assessment Upper Extremity Assessment Upper Extremity Assessment: LUE deficits/detail LUE Deficits / Details: shoulder flexion 2+/5, wrist extension 2/5, elbow flexion/extension 3/5, digits flexion/ext  2/5 LUE Sensation: decreased light touch LUE  Coordination: decreased fine motor;decreased gross motor   Lower Extremity Assessment Lower Extremity Assessment: Defer to PT evaluation LLE Deficits / Details: dorsiflexion 4/5, plantarflexion 4/5, hip flexion 4/5, knee extension 4+/5, hip  add/abd 4/5 LLE Sensation: decreased light touch LLE Coordination: decreased gross motor       Communication Communication Communication: No difficulties   Cognition Arousal/Alertness: Awake/alert Behavior During Therapy: Anxious (tearful) Overall Cognitive Status: Within Functional Limits for tasks assessed                                       General Comments  encouraged active movement of LUE as able. patient tearful at times during session and is requesting to return home today if possible.            Home Living Family/patient expects to be discharged to:: Private residence Living Arrangements: Spouse/significant other;Children;Parent Available Help at Discharge: Family Type of Home: House Home Access: Stairs to enter Technical brewer of Steps: 2-3   Home Layout: Two level     Bathroom Shower/Tub: Tub/shower unit         Home Equipment: None          Prior Functioning/Environment Prior Level of Function : Independent/Modified Independent;Working/employed;Driving                        OT Problem List: Decreased strength;Decreased coordination;Decreased range of motion;Impaired sensation;Decreased activity tolerance;Decreased safety awareness;Impaired balance (sitting and/or standing);Decreased knowledge of use of DME or AE;Impaired UE functional use;Decreased knowledge of precautions      OT Treatment/Interventions: Self-care/ADL training;Therapeutic exercise;Therapeutic activities;Neuromuscular education;Energy conservation;DME and/or AE instruction;Patient/family education;Balance training;Manual therapy    OT Goals(Current goals can be found in the care plan section) Acute Rehab OT Goals Patient Stated Goal: to return to PLOF OT Goal Formulation: With patient Time For Goal Achievement: 03/15/23 Potential to Achieve Goals: Good ADL Goals Pt Will Perform Upper Body Dressing: with modified independence;standing Pt Will  Transfer to Toilet: with modified independence;ambulating Pt Will Perform Toileting - Clothing Manipulation and hygiene: with modified independence;sit to/from stand Pt/caregiver will Perform Home Exercise Program: Increased ROM;Increased strength;Left upper extremity;With theraputty;With written HEP provided;Independently  OT Frequency: Min 3X/week       AM-PAC OT "6 Clicks" Daily Activity     Outcome Measure Help from another person eating meals?: A Little Help from another person taking care of personal grooming?: A Little Help from another person toileting, which includes using toliet, bedpan, or urinal?: A Little Help from another person bathing (including washing, rinsing, drying)?: A Little Help from another person to put on and taking off regular upper body clothing?: A Little Help from another person to put on and taking off regular lower body clothing?: A Little 6 Click Score: 18   End of Session Nurse Communication: Mobility status  Activity Tolerance: Patient tolerated treatment well Patient left: in bed;with call bell/phone within reach;with bed alarm set;with nursing/sitter in room  OT Visit Diagnosis: Unsteadiness on feet (R26.81);Hemiplegia and hemiparesis Hemiplegia - Right/Left: Left Hemiplegia - dominant/non-dominant: Non-Dominant                Time: [id number] OT Time Calculation (min): 21 min Charges:  OT General Charges $OT Visit: 1 Visit OT Evaluation $OT Eval Moderate Complexity: 1 Mod OT Treatments $Self Care/Home Management : 8-22 mins  Darleen Crocker, MS, OTR/L , CBIS ascom [phone]  03/01/23, 1:38 PM

## 2023-03-01 NOTE — Evaluation (Signed)
Physical Therapy Evaluation Patient Details Name: Brenda Todd MRN: [id number] DOB: Apr 28, 1981 Today's Date: 03/01/2023  History of Present Illness  Patient is a 42 year old female with chronic HTN, prior MI, kidney stones who presents with worsening left side weakness. 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  Clinical Impression  Patient is emotionally labile during session. She reports she is independent at baseline and works as a Educational psychologist. She lives with her family.  The patient has left side motor and sensory deficits, arm greater than leg. She did not require physical assistance with bed mobility or transfers. She has mild trunk sway with eyes closed in standing, but no physical assistance required to maintain midline standing balance. She ambulated in hallway without device with occasional cues for improved gait kinematics. Recommend to continue PT while in the hospital to maximize independence and facilitate return to prior level of function. Anticipate the need for supervision/assistance and highly recommended PT follow up at discharge.      Recommendations for follow up therapy are one component of a multi-disciplinary discharge planning process, led by the attending physician.  Recommendations may be updated based on patient status, additional functional criteria and insurance authorization.  Follow Up Recommendations       Assistance Recommended at Discharge Set up Supervision/Assistance  Patient can return home with the following  Assist for transportation;Help with stairs or ramp for entrance;A little help with bathing/dressing/bathroom    Equipment Recommendations None recommended by PT  Recommendations for Other Services       Functional Status Assessment Patient has had a recent decline in their functional status and demonstrates the ability to make significant improvements in function in a reasonable and predictable amount  of time.     Precautions / Restrictions Precautions Precautions: Fall Restrictions Weight Bearing Restrictions: No      Mobility  Bed Mobility Overal bed mobility: Modified Independent             General bed mobility comments: increased time and effort required    Transfers Overall transfer level: Needs assistance Equipment used: None Transfers: Sit to/from Stand Sit to Stand: Supervision                Ambulation/Gait Ambulation/Gait assistance: Min guard, Supervision Gait Distance (Feet): 100 Feet Assistive device: None Gait Pattern/deviations: Decreased dorsiflexion - left, Decreased step length - left, Decreased stride length Gait velocity: decreased     General Gait Details: cues for heel toe gait pattern and foot clearance on LLE. patient fatigued with ambulation  Stairs            Wheelchair Mobility    Modified Rankin (Stroke Patients Only)       Balance Overall balance assessment: Needs assistance Sitting-balance support: Feet supported Sitting balance-Leahy Scale: Good     Standing balance support: No upper extremity supported Standing balance-Leahy Scale: Fair Standing balance comment: mild trunk sway with eyes closed. stand by assistance provided for safety                             Pertinent Vitals/Pain Pain Assessment Pain Assessment: Faces Faces Pain Scale: Hurts a little bit Pain Location: headache Pain Descriptors / Indicators: Headache Pain Intervention(s): Limited activity within patient's tolerance, Monitored during session, Repositioned    Home Living Family/patient expects to be discharged to:: Private residence Living Arrangements: Spouse/significant other;Children;Parent Available Help at Discharge: Family Type  of Home: House Home Access: Stairs to enter   [employer] of Steps: 2-3   Home Layout: Two level Home Equipment: None      Prior Function Prior Level of Function :  Independent/Modified Independent;Working/employed;Driving                     Hand Dominance   Dominant Hand: Right    Extremity/Trunk Assessment   Upper Extremity Assessment Upper Extremity Assessment: LUE deficits/detail (RUE WFL) LUE Deficits / Details: shoulder flexion 2+/5, wrist extension 2/5, elbow flexion/extension 3/5. LUE Sensation: decreased light touch LUE Coordination: decreased fine motor;decreased gross motor    Lower Extremity Assessment Lower Extremity Assessment: LLE deficits/detail LLE Deficits / Details: dorsiflexion 4/5, plantarflexion 4/5, hip flexion 4/5, knee extension 4+/5, hip add/abd 4/5 LLE Sensation: decreased light touch LLE Coordination: decreased gross motor       Communication   Communication: No difficulties  Cognition Arousal/Alertness: Awake/alert Behavior During Therapy: Anxious Overall Cognitive Status: Within Functional Limits for tasks assessed                                 General Comments: tearful at times        General Comments General comments (skin integrity, edema, etc.): encouraged active movement of LUE as able. patient tearful at times during session and is requesting to return home today if possible.    Exercises     Assessment/Plan    PT Assessment Patient needs continued PT services  PT Problem List Decreased strength;Decreased range of motion;Decreased activity tolerance;Decreased balance;Decreased mobility;Decreased coordination;Impaired sensation;Decreased safety awareness       PT Treatment Interventions DME instruction;Gait training;Stair training;Functional mobility training;Therapeutic activities;Therapeutic exercise;Balance training;Neuromuscular re-education;Cognitive remediation;Patient/family education    PT Goals (Current goals can be found in the Care Plan section)  Acute Rehab PT Goals Patient Stated Goal: to go home today PT Goal Formulation: With patient Time For Goal  Achievement: 03/15/23 Potential to Achieve Goals: Fair    Frequency Min 4X/week     Co-evaluation               AM-PAC PT "6 Clicks" Mobility  Outcome Measure Help needed turning from your back to your side while in a flat bed without using bedrails?: None Help needed moving from lying on your back to sitting on the side of a flat bed without using bedrails?: A Little Help needed moving to and from a bed to a chair (including a wheelchair)?: A Little Help needed standing up from a chair using your arms (e.g., wheelchair or bedside chair)?: A Little Help needed to walk in hospital room?: A Little Help needed climbing 3-5 steps with a railing? : A Little 6 Click Score: 19    End of Session   Activity Tolerance: Patient tolerated treatment well Patient left: in bed;with call bell/phone within reach   PT Visit Diagnosis: Hemiplegia and hemiparesis Hemiplegia - Right/Left: Left Hemiplegia - dominant/non-dominant: Non-dominant Hemiplegia - caused by: Cerebral infarction    TimeAT:[id number] PT Time Calculation (min) (ACUTE ONLY): 16 min   Charges:   PT Evaluation $PT Eval Low Complexity: 1 Low PT Treatments $Gait Training: 8-22 mins       Minna Merritts, PT, MPT   Percell Locus 03/01/2023, 11:11 AM

## 2023-03-01 NOTE — Telephone Encounter (Signed)
-----   Message from Gerrie Nordmann, NP sent at 03/01/2023  2:38 PM EDT ----- Regarding: Zio AT monitor Patient will need to have two separate monitors mailed to her home. She had a acute CVA so neurology is asking for a 30 day event monitor to rule out atrial fibrillation. Cardiology to read report but there was no follow up requested at this time.  Thanks,  [employer]

## 2023-03-01 NOTE — TOC Progression Note (Signed)
Transition of Care [facility]) - Progression Note    Patient Details  Name: Brenda Todd MRN: [id number] Date of Birth: June 03, 1981  Transition of [facility]) CM/SW Contact  Gerilyn Pilgrim, LCSW Phone Number: 03/01/2023, 2:57 PM  Clinical Narrative:    Referral faxed to [facility] clinic in [place] for Outpatient Therapy. Unsure if this clinic will be able to see patient due to her not having a PCP but CSW has LVM for [facility] clinic to see if they are able to assist. PCP options added to patient AVS.        Expected Discharge Plan and Services                                               Social Determinants of Health (SDOH) Interventions SDOH Screenings   Food Insecurity: No Food Insecurity (03/01/2023)  Housing: Low Risk  (03/01/2023)  Transportation Needs: No Transportation Needs (03/01/2023)  Utilities: Not At Risk (03/01/2023)  Tobacco Use: High Risk (03/01/2023)    Readmission Risk Interventions     No data to display

## 2023-03-01 NOTE — Plan of Care (Signed)

## 2023-03-01 NOTE — Progress Notes (Addendum)
Neurology progress note  S: LUE stronger today, drift but not to bed. No LLE drift. No new symptoms to report.. 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  O:  Vitals:   03/01/23 0918 03/01/23 1315  BP: (!) 193/118 (!) 181/113  Pulse: 94 86  Resp: 17 18  Temp: 98.2 F (36.8 C) 98 F (36.7 C)  SpO2: 95% 98%   Physical Exam Gen: A&O x4, NAD HEENT: Atraumatic, normocephalic;mucous membranes moist; oropharynx clear, tongue without atrophy or fasciculations. Neck: Supple, trachea midline. Resp: CTAB, no w/r/r CV: RRR, no m/g/r; nml S1 and S2. 2+ symmetric peripheral pulses. Abd: soft/NT/ND; nabs x 4 quad Extrem: Nml bulk; no cyanosis, clubbing, or edema.   Neuro: *MS: A&O x4. Follows multi-step commands.  *Speech: fluid, nondysarthric, able to name and repeat *CN:    I: Deferred   II,III: PERRLA, VFF by confrontation, optic discs unable to be visualized 2/2 pupillary constriction   III,IV,VI: EOMI w/o nystagmus, no ptosis   V: Impaired to LT on L   VII: Eyelid closure was full.  L NLF flattening   VIII: Hearing intact to voice   IX,X: Voice normal, palate elevates symmetrically    XI: SCM/trap 5/5 bilat   XII: Tongue protrudes midline, no atrophy or fasciculations  *Motor:   Normal bulk.  No tremor, rigidity or bradykinesia. Drift but not to bed LUE, no drift in any other extremity. *Sensory: Impaired to LT on L Propioception intact bilat.  No double-simultaneous extinction.  *Coordination:  FNF intact on R *Reflexes:  2+ and symmetric throughout without clonus; toes down-going bilat *Gait: deferred   NIHSS   1a Level of Conscious.: 0 1b LOC Questions: 0 1c LOC Commands: 0 2 Best Gaze: 0 3 Visual: 0 4 Facial Palsy: 1 5a Motor Arm - left: 1 5b Motor Arm - Right: 0 6a Motor Leg - Left: 0 6b Motor Leg - Right: 0 7 Limb Ataxia: 0 8 Sensory: 1 9 Best Language: 0 10 Dysarthria: 0 11 Extinct. and Inatten.:  0   TOTAL: 3  Data/stroke workup this admission  CTA H&N 1. Asymmetric hypoattenuation in the posterior limb of the right internal capsule, corresponds to acute infarct seen on same day brain MRI. 2. No acute intracranial hemorrhage. 3. No large vessel occlusion or significant stenosis of the head or neck vessels. 4. Mild plaque at the carotid bifurcations.  MRI Brain 11 mm acute infarct at the posterior limb right internal capsule.   CNS imaging personally reviewed; I agree with the above radiology interpretations  TTE 1. Left ventricular ejection fraction, by estimation, is 60 to 65% . The left ventricle has normal function. The left ventricle has no regional wall motion abnormalities. There is mild left ventricular hypertrophy. Left ventricular diastolic parameters were normal.  2. Right ventricular systolic function is normal. The right ventricular size is normal. Tricuspid regurgitation signal is inadequate for assessing PA pressure.  3. The mitral valve is normal in structure. No evidence of mitral valve regurgitation. No evidence of mitral stenosis.  4. The aortic valve is normal in structure. Aortic valve regurgitation is not visualized. No aortic stenosis is present.  5. The inferior vena cava is normal in size with greater than 50% respiratory variability, suggesting right atrial pressure of 3 mmHg. 6. Agitated saline contrast bubble study was negative, with no evidence of any interatrial shunt.  Stroke Labs     Component Value Date/Time  CHOL 194 02/28/2023 1906   TRIG 92 02/28/2023 1906   HDL 49 02/28/2023 1906   CHOLHDL 4.0 02/28/2023 1906   VLDL 18 02/28/2023 1906   LDLCALC 127 (H) 02/28/2023 1906    Lab Results  Component Value Date/Time   HGBA1C 5.6 09/02/2020 06:09 AM    A/P: This is a 42 yo woman with hx chronic HTN, prior MI, kidney stones who presented yesterday to ED for 2nd time in 2 days for L sided weakness. On 3/24 she presented to [facility] ED for  same and CT head and MRI brain were negative for acute findings and she was discharged home. Sx were initially stuttering but worsened that evening and have persisted since then. Prodrome consistent with capsular warning syndrome. 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 Etiology favored to be small vessel (patient has also had prior MI) however given her age she will also need ambulatory cardiac monitoring after discharge to eval for possible occult a fib.  - From a stroke standpoint we would recommend ASA 81mg  daily + plavix 75mg  daily x21 days f/b plavix 75mg  daily monotherapy after that (stroke on ASA would be considered an aspirin failure). She is on aspirin for CAD per cardiology so I will leave her on ASA and plavix until she follows up with them for further guidance.  - Atorvastatin 80mg  daily - Ambulatory cardiac monitoring after discharge - Otherwise stroke workup has been completed - PT cleared by PT - OK to discharge from neuro standpoint after ambulatory cardiac monitoring has been arranged - I will arrange outpatient neurology f/u  Neurology will be available for questions prn going forward.  Su Monks, MD [facility] [phone]  If 7pm- 7am, please page neurology on call as listed in [place].

## 2023-03-01 NOTE — Plan of Care (Signed)
  Problem: Education: Goal: Knowledge of disease or condition will improve Outcome: Progressing   Problem: Ischemic Stroke/TIA Tissue Perfusion: Goal: Complications of ischemic stroke/TIA will be minimized Outcome: Progressing   Problem: Coping: Goal: Will verbalize positive feelings about self Outcome: Progressing   Problem: Self-Care: Goal: Ability to participate in self-care as condition permits will improve Outcome: Progressing   Problem: Nutrition: Goal: Risk of aspiration will decrease Outcome: Progressing   Problem: Education: Goal: Knowledge of General Education information will improve Description: Including pain rating scale, medication(s)/side effects and non-pharmacologic comfort measures Outcome: Progressing   Problem: Health Behavior/Discharge Planning: Goal: Ability to manage health-related needs will improve Outcome: Progressing   Problem: Clinical Measurements: Goal: Ability to maintain clinical measurements within normal limits will improve Outcome: Progressing   Problem: Activity: Goal: Risk for activity intolerance will decrease Outcome: Progressing   Problem: Coping: Goal: Level of anxiety will decrease Outcome: Progressing   Problem: Elimination: Goal: Will not experience complications related to bowel motility Outcome: Progressing   Problem: Pain Managment: Goal: General experience of comfort will improve Outcome: Progressing

## 2023-03-02 ENCOUNTER — Other Ambulatory Visit: Payer: Self-pay

## 2023-03-02 LAB — HEMOGLOBIN A1C
Hgb A1c MFr Bld: 5.8 % — ABNORMAL HIGH (ref 4.8–5.6)
Mean Plasma Glucose: 120 mg/dL

## 2023-03-04 LAB — ALDOSTERONE + RENIN ACTIVITY W/ RATIO
ALDO / PRA Ratio: 1.4 (ref 0.0–30.0)
Aldosterone: 1.9 ng/dL (ref 0.0–30.0)
PRA LC/MS/MS: 1.315 ng/mL/hr (ref 0.167–5.380)

## 2023-04-05 ENCOUNTER — Ambulatory Visit: Payer: Self-pay | Admitting: Diagnostic Neuroimaging

## 2023-04-05 ENCOUNTER — Encounter: Payer: Self-pay | Admitting: Diagnostic Neuroimaging

## 2023-05-17 ENCOUNTER — Other Ambulatory Visit: Payer: Self-pay

## 2023-05-17 ENCOUNTER — Emergency Department: Payer: Self-pay

## 2023-05-17 ENCOUNTER — Emergency Department
Admission: EM | Admit: 2023-05-17 | Discharge: 2023-05-17 | Disposition: A | Discharge status: Home / Self Care | Payer: Self-pay | Attending: Emergency Medicine | Admitting: Emergency Medicine

## 2023-05-17 ENCOUNTER — Encounter: Payer: Self-pay | Admitting: Intensive Care

## 2023-05-17 DIAGNOSIS — I160 Hypertensive urgency: Secondary | ICD-10-CM | POA: Insufficient documentation

## 2023-05-17 DIAGNOSIS — I2510 Atherosclerotic heart disease of native coronary artery without angina pectoris: Secondary | ICD-10-CM | POA: Insufficient documentation

## 2023-05-17 DIAGNOSIS — N12 Tubulo-interstitial nephritis, not specified as acute or chronic: Secondary | ICD-10-CM | POA: Insufficient documentation

## 2023-05-17 DIAGNOSIS — I10 Essential (primary) hypertension: Secondary | ICD-10-CM | POA: Insufficient documentation

## 2023-05-17 HISTORY — DX: Cerebral infarction, unspecified: I63.9

## 2023-05-17 LAB — URINALYSIS, ROUTINE W REFLEX MICROSCOPIC
Bilirubin Urine: NEGATIVE
Glucose, UA: NEGATIVE mg/dL
Ketones, ur: NEGATIVE mg/dL
Nitrite: NEGATIVE
Protein, ur: 100 mg/dL — AB
RBC / HPF: >50 RBC/hpf (ref 0–5)
Specific Gravity, Urine: 1.019 (ref 1.005–1.030)
WBC, UA: >50 WBC/hpf (ref 0–5)
pH: 7 (ref 5.0–8.0)

## 2023-05-17 LAB — CBC
HCT: 44.7 % (ref 36.0–46.0)
Hemoglobin: 14.5 g/dL (ref 12.0–15.0)
MCH: 26.9 pg (ref 26.0–34.0)
MCHC: 32.4 g/dL (ref 30.0–36.0)
MCV: 82.8 fL (ref 80.0–100.0)
Platelets: 236 10*3/uL (ref 150–400)
RBC: 5.4 MIL/uL — ABNORMAL HIGH (ref 3.87–5.11)
RDW: 13.8 % (ref 11.5–15.5)
WBC: 9.5 10*3/uL (ref 4.0–10.5)
nRBC: 0 % (ref 0.0–0.2)

## 2023-05-17 LAB — BASIC METABOLIC PANEL
Anion gap: 11 (ref 5–15)
BUN: 15 mg/dL (ref 6–20)
CO2: 23 mmol/L (ref 22–32)
Calcium: 8.9 mg/dL (ref 8.9–10.3)
Chloride: 102 mmol/L (ref 98–111)
Creatinine, Ser: 0.69 mg/dL (ref 0.44–1.00)
GFR, Estimated: >60 mL/min (ref >60)
Glucose, Bld: 124 mg/dL — ABNORMAL HIGH (ref 70–99)
Potassium: 3.3 mmol/L — ABNORMAL LOW (ref 3.5–5.1)
Sodium: 136 mmol/L (ref 135–145)

## 2023-05-17 LAB — POC URINE PREG, ED: Preg Test, Ur: NEGATIVE

## 2023-05-17 MED ORDER — SODIUM CHLORIDE 0.9 % IV SOLN
1.0000 g | INTRAVENOUS | Status: DC
  Administered 2023-05-17: 1 g via INTRAVENOUS
  Filled 2023-05-17: qty 10

## 2023-05-17 MED ORDER — KETOROLAC TROMETHAMINE 15 MG/ML IJ SOLN
15.0000 mg | Freq: Once | INTRAMUSCULAR | Status: AC
  Administered 2023-05-17: 15 mg via INTRAVENOUS
  Filled 2023-05-17: qty 1

## 2023-05-17 MED ORDER — SODIUM CHLORIDE 0.9 % IV BOLUS
1000.0000 mL | Freq: Once | INTRAVENOUS | Status: AC
  Administered 2023-05-17: 1000 mL via INTRAVENOUS

## 2023-05-17 MED ORDER — MORPHINE SULFATE (PF) 4 MG/ML IV SOLN
4.0000 mg | Freq: Once | INTRAVENOUS | Status: AC
  Administered 2023-05-17: 4 mg via INTRAVENOUS
  Filled 2023-05-17: qty 1

## 2023-05-17 MED ORDER — NAPROXEN 500 MG PO TABS: 500.0000 mg | ORAL_TABLET | Freq: Two times a day (BID) | ORAL | 0 refills | Status: AC

## 2023-05-17 MED ORDER — CEFDINIR 300 MG PO CAPS: 300.0000 mg | ORAL_CAPSULE | Freq: Two times a day (BID) | ORAL | 0 refills | Status: AC

## 2023-05-17 MED ORDER — ONDANSETRON HCL 4 MG/2ML IJ SOLN
4.0000 mg | Freq: Once | INTRAMUSCULAR | Status: AC
  Administered 2023-05-17: 4 mg via INTRAVENOUS
  Filled 2023-05-17: qty 2

## 2023-05-17 NOTE — ED Triage Notes (Signed)
Patient c/o left flank pain that started this AM. Reports some nausea and pain during urination.   History stroke

## 2023-05-17 NOTE — Discharge Instructions (Signed)
Your CAT scan did not reveal any stones within your ureters, though you have multiple small punctate stones within your kidneys.  Your urine does reveal infection, and given your pain in your left side, it is likely that the infection has extended into your kidney.  You are given a dose of antibiotics in the emergency department as well as IV fluids and pain medication.  Please continue to take the oral antibiotic for the next 10 days, please complete the course even if you begin to feel better.  Please return to the emergency department if you develop worsening pain, fevers, chills, nausea, vomiting, or any other new, worsening, or change in symptoms or other concerns.  It was a pleasure caring for you today.

## 2023-05-17 NOTE — ED Provider Triage Note (Addendum)
Emergency Medicine Provider Triage Evaluation Note  Brenda Todd , a 42 y.o. female  was evaluated in triage.  Pt complains of left flank pain with history of uti and stones.  Review of Systems  Positive: + Nausea, Left flank pain Negative: No vomiting, no fever, chills  Physical Exam  BP (!) 106/93 (BP Location: Left Arm)   Pulse 80   Temp 99.3 F (37.4 C) (Oral)   Resp 18   Ht 5\' 3"  (1.6 m)   Wt 76.2 kg   SpO2 100%   BMI 29.76 kg/m  Gen:   Awake, no distress   Resp:  Normal effort.  Lungs clear bilat MSK:   Moves extremities without difficulty  Other:  + left flank tenderness  Medical Decision Making  Medically screening exam initiated at 2:52 PM.  Appropriate orders placed.  Brenda Todd was informed that the remainder of the evaluation will be completed by another provider, this initial triage assessment does not replace that evaluation, and the importance of remaining in the ED until their evaluation is complete.     Tommi Rumps, PA-C 05/17/23 1454    Tommi Rumps, PA-C 05/17/23 1506

## 2023-05-17 NOTE — ED Provider Notes (Signed)
[facility] Provider Note    Event Date/Time   First MD Initiated Contact with Patient 05/17/23 1542     (approximate)   History   Flank Pain   HPI  Brenda Todd is a 42 y.o. female with a past medical history of pyelonephritis, kidney stones, CVA, hypertension who presents today for evaluation of left-sided flank pain that began at 3 AM this morning.  Patient reports that it feels the same as her previous episodes of kidney stones.  She has had hematuria that began today.  She also reports that she has had burning with urination for the past 4 hours.  She is nauseated but has not had any vomiting.  No fevers or chills.  She has not had anything or drink or eat today.  Patient Active Problem List   Diagnosis Date Noted   Dyslipidemia 03/01/2023   Overweight (BMI 25.0-29.9) 03/01/2023   Acute CVA (cerebrovascular accident) (HCC) 02/28/2023   Coronary artery disease 02/28/2023   Melena 05/19/2022   Chest pain 04/28/2021   Epigastric pain 04/28/2021   Dysuria 04/28/2021   Leukocytosis 04/28/2021   Ovarian mass    NSTEMI (non-ST elevated myocardial infarction) (HCC) 08/31/2020   Problem with medical care compliance 01/25/2020   Hypokalemia 01/25/2020   Hypertensive urgency 01/25/2020   Renal colic 01/24/2020   History of MI (myocardial infarction) 01/24/2020   Ischemic chest pain (HCC) 01/24/2020   Postpartum care following cesarean delivery 12/17/2015   Elevated blood pressure affecting pregnancy in third trimester, antepartum 12/08/2015   Preeclampsia, severe 12/08/2015   Nephrolithiasis 10/12/2015   Supervision of high-risk pregnancy 10/11/2015   Labor and delivery, indication for care 10/11/2015   Hypertension    Cramping affecting pregnancy, antepartum 09/18/2015   Elevated BP 09/02/2015   Pyelonephritis affecting pregnancy in second trimester, antepartum 08/08/2015   Kidney infection 08/06/2015   Pyelonephritis affecting pregnancy in  second trimester 07/13/2015   History of stillbirth 07/10/2015          Physical Exam   Triage Vital Signs: ED Triage Vitals  Enc Vitals Group     BP 05/17/23 1448 (!) 106/93     Pulse Rate 05/17/23 1448 80     Resp 05/17/23 1448 18     Temp 05/17/23 1448 99.3 F (37.4 C)     Temp Source 05/17/23 1448 Oral     SpO2 05/17/23 1448 100 %     Weight 05/17/23 1450 168 lb (76.2 kg)     Height 05/17/23 1450 5\' 3"  (1.6 m)     Head Circumference --      Peak Flow --      Pain Score 05/17/23 1450 9     Pain Loc --      Pain Edu? --      Excl. in GC? --     Most recent vital signs: Vitals:   05/17/23 1448  BP: (!) 106/93  Pulse: 80  Resp: 18  Temp: 99.3 F (37.4 C)  SpO2: 100%    Physical Exam Vitals and nursing note reviewed.  Constitutional:      General: Awake and alert. No acute distress.    Appearance: Normal appearance. The patient is overweight.  HENT:     Head: Normocephalic and atraumatic.     Mouth: Mucous membranes are moist.  Eyes:     General: PERRL. Normal EOMs        Right eye: No discharge.        Left  eye: No discharge.     Conjunctiva/sclera: Conjunctivae normal.  Cardiovascular:     Rate and Rhythm: Normal rate and regular rhythm.     Pulses: Normal pulses.  Pulmonary:     Effort: Pulmonary effort is normal. No respiratory distress.     Breath sounds: Normal breath sounds.  Abdominal:     Abdomen is soft. There is no abdominal tenderness. No rebound or guarding. No distention.  Left CVA tenderness Musculoskeletal:        General: No swelling. Normal range of motion.     Cervical back: Normal range of motion and neck supple.  Skin:    General: Skin is warm and dry.     Capillary Refill: Capillary refill takes less than 2 seconds.     Findings: No rash.  Neurological:     Mental Status: The patient is awake and alert.      ED Results / Procedures / Treatments   Labs (all labs ordered are listed, but only abnormal results are  displayed) Labs Reviewed  URINALYSIS, ROUTINE W REFLEX MICROSCOPIC - Abnormal; Notable for the following components:      Result Value   Color, Urine AMBER (*)    APPearance CLOUDY (*)    Hgb urine dipstick LARGE (*)    Protein, ur 100 (*)    Leukocytes,Ua MODERATE (*)    Bacteria, UA MANY (*)    All other components within normal limits  BASIC METABOLIC PANEL - Abnormal; Notable for the following components:   Potassium 3.3 (*)    Glucose, Bld 124 (*)    All other components within normal limits  CBC - Abnormal; Notable for the following components:   RBC 5.40 (*)    All other components within normal limits  POC URINE PREG, ED     EKG     RADIOLOGY I independently reviewed and interpreted imaging and agree with radiologists findings.     PROCEDURES:  Critical Care performed:   Procedures   MEDICATIONS ORDERED IN ED: Medications  cefTRIAXone (ROCEPHIN) 1 g in sodium chloride 0.9 % 100 mL IVPB (0 g Intravenous Stopped 05/17/23 1650)  sodium chloride 0.9 % bolus 1,000 mL (0 mLs Intravenous Stopped 05/17/23 1710)  ketorolac (TORADOL) 15 MG/ML injection 15 mg (15 mg Intravenous Given 05/17/23 1613)  ondansetron (ZOFRAN) injection 4 mg (4 mg Intravenous Given 05/17/23 1614)  morphine (PF) 4 MG/ML injection 4 mg (4 mg Intravenous Given 05/17/23 1650)     IMPRESSION / MDM / ASSESSMENT AND PLAN / ED COURSE  I reviewed the triage vital signs and the nursing notes.   Differential diagnosis includes, but is not limited to, pyelonephritis, nephrolithiasis, ureteral colic, infected stone.  Patient is awake and alert, hemodynamically stable and afebrile.  She is uncomfortable in appearance.  Labs obtained in triage are overall reassuring, no leukocytosis, however she has greater than 50 WBCs and RBCs in her urine with many bacteria.  CT renal stone also obtained.  While waiting for results, she was treated symptomatically with Toradol, Zofran, IV fluids, and Rocephin.  She was  subsequently given morphine as well.  CT renal stone reveals punctate stones within the kidneys, though there is no ureteral stone.  Upon reevaluation, patient reports that she feels significantly improved.  Given her flank pain and her obviously positive urinary tract infection we will treat as pyelonephritis.  She was instructed to complete the antibiotic course even if begins to feel better.  We discussed strict return precautions and the  importance of close outpatient follow-up.  Patient understands and agrees with plan.  She was discharged in stable condition.   Patient's presentation is most consistent with acute complicated illness / injury requiring diagnostic workup.      FINAL CLINICAL IMPRESSION(S) / ED DIAGNOSES   Final diagnoses:  Pyelonephritis     Rx / DC Orders   ED Discharge Orders          Ordered    cefdinir (OMNICEF) 300 MG capsule  2 times daily        05/17/23 1701    naproxen (NAPROSYN) 500 MG tablet  2 times daily with meals        05/17/23 1701             Note:  This document was prepared using Dragon voice recognition software and may include unintentional dictation errors.   Keturah Shavers 05/17/23 1718    Trinna Post, MD 05/17/23 2029

## 2023-08-30 ENCOUNTER — Other Ambulatory Visit: Payer: Self-pay

## 2023-08-30 ENCOUNTER — Emergency Department: Payer: Self-pay

## 2023-08-30 ENCOUNTER — Emergency Department
Admission: EM | Admit: 2023-08-30 | Discharge: 2023-08-30 | Disposition: A | Discharge status: Home / Self Care | Payer: Self-pay | Attending: Emergency Medicine | Admitting: Emergency Medicine

## 2023-08-30 DIAGNOSIS — R0789 Other chest pain: Secondary | ICD-10-CM | POA: Insufficient documentation

## 2023-08-30 DIAGNOSIS — R42 Dizziness and giddiness: Secondary | ICD-10-CM | POA: Insufficient documentation

## 2023-08-30 DIAGNOSIS — I10 Essential (primary) hypertension: Secondary | ICD-10-CM | POA: Insufficient documentation

## 2023-08-30 DIAGNOSIS — R55 Syncope and collapse: Secondary | ICD-10-CM | POA: Insufficient documentation

## 2023-08-30 DIAGNOSIS — H538 Other visual disturbances: Secondary | ICD-10-CM | POA: Insufficient documentation

## 2023-08-30 LAB — DIFFERENTIAL
Abs Immature Granulocytes: 0.02 10*3/uL (ref 0.00–0.07)
Basophils Absolute: 0.1 10*3/uL (ref 0.0–0.1)
Basophils Relative: 1 %
Eosinophils Absolute: 0.1 10*3/uL (ref 0.0–0.5)
Eosinophils Relative: 2 %
Immature Granulocytes: 0 %
Lymphocytes Relative: 30 %
Lymphs Abs: 2.6 10*3/uL (ref 0.7–4.0)
Monocytes Absolute: 0.5 10*3/uL (ref 0.1–1.0)
Monocytes Relative: 6 %
Neutro Abs: 5.2 10*3/uL (ref 1.7–7.7)
Neutrophils Relative %: 61 %

## 2023-08-30 LAB — COMPREHENSIVE METABOLIC PANEL
ALT: 17 U/L (ref 0–44)
AST: 17 U/L (ref 15–41)
Albumin: 4.2 g/dL (ref 3.5–5.0)
Alkaline Phosphatase: 57 U/L (ref 38–126)
Anion gap: 9 (ref 5–15)
BUN: 15 mg/dL (ref 6–20)
CO2: 24 mmol/L (ref 22–32)
Calcium: 9.2 mg/dL (ref 8.9–10.3)
Chloride: 103 mmol/L (ref 98–111)
Creatinine, Ser: 0.68 mg/dL (ref 0.44–1.00)
GFR, Estimated: >60 mL/min (ref >60)
Glucose, Bld: 136 mg/dL — ABNORMAL HIGH (ref 70–99)
Potassium: 3.4 mmol/L — ABNORMAL LOW (ref 3.5–5.1)
Sodium: 136 mmol/L (ref 135–145)
Total Bilirubin: 0.5 mg/dL (ref 0.3–1.2)
Total Protein: 7.7 g/dL (ref 6.5–8.1)

## 2023-08-30 LAB — PROTIME-INR
INR: 1 (ref 0.8–1.2)
Prothrombin Time: 12.9 seconds (ref 11.4–15.2)

## 2023-08-30 LAB — CBC
HCT: 44.2 % (ref 36.0–46.0)
Hemoglobin: 14.6 g/dL (ref 12.0–15.0)
MCH: 26.7 pg (ref 26.0–34.0)
MCHC: 33 g/dL (ref 30.0–36.0)
MCV: 80.8 fL (ref 80.0–100.0)
Platelets: 253 10*3/uL (ref 150–400)
RBC: 5.47 MIL/uL — ABNORMAL HIGH (ref 3.87–5.11)
RDW: 13.6 % (ref 11.5–15.5)
WBC: 8.4 10*3/uL (ref 4.0–10.5)
nRBC: 0 % (ref 0.0–0.2)

## 2023-08-30 LAB — APTT: aPTT: 27 seconds (ref 24–36)

## 2023-08-30 LAB — TROPONIN I (HIGH SENSITIVITY)
Troponin I (High Sensitivity): 5 ng/L (ref <18)
Troponin I (High Sensitivity): 7 ng/L (ref <18)

## 2023-08-30 LAB — CBG MONITORING, ED: Glucose-Capillary: 125 mg/dL — ABNORMAL HIGH (ref 70–99)

## 2023-08-30 LAB — ETHANOL: Alcohol, Ethyl (B): <10 mg/dL (ref <10)

## 2023-08-30 MED ORDER — HYDRALAZINE HCL 20 MG/ML IJ SOLN
10.0000 mg | Freq: Once | INTRAMUSCULAR | Status: AC
  Administered 2023-08-30: 10 mg via INTRAVENOUS
  Filled 2023-08-30: qty 1

## 2023-08-30 MED ORDER — SODIUM CHLORIDE 0.9% FLUSH
3.0000 mL | Freq: Once | INTRAVENOUS | Status: AC
  Administered 2023-08-30: 3 mL via INTRAVENOUS

## 2023-08-30 NOTE — ED Provider Notes (Signed)
[facility] Provider Note    Event Date/Time   First MD Initiated Contact with Patient 08/30/23 1245     (approximate)   History   Dizziness   HPI  Brenda Todd is a 42 y.o. female who presents to the emerged [place] department today after work of dizziness, near syncope chest pressure and blurry vision.  The patient states that when she woke up this morning she felt tired although that is not unusual for her.  When she got to work she had fairly sudden onset of the lightheadedness and near syncope.  This was accompanied by pressure across her chest.  The time my exam she is feeling somewhat better but still has the pressure.  States her blood pressure normally runs in the 130s.  Does have history of heart disease and stroke in the past.     Physical Exam   Triage Vital Signs: ED Triage Vitals  Encounter Vitals Group     BP 08/30/23 1228 (!) 242/121     Systolic BP Percentile --      Diastolic BP Percentile --      Pulse Rate 08/30/23 1228 (!) 111     Resp 08/30/23 1228 (!) 22     Temp 08/30/23 1228 98.3 F (36.8 C)     Temp Source 08/30/23 1228 Oral     SpO2 08/30/23 1228 100 %     Weight 08/30/23 1229 163 lb (73.9 kg)     Height 08/30/23 1229 5\' 3"  (1.6 m)     Head Circumference --      Peak Flow --      Pain Score 08/30/23 1229 10     Pain Loc --      Pain Education --      Exclude from Growth Chart --     Most recent vital signs: Vitals:   08/30/23 1228  BP: (!) 242/121  Pulse: (!) 111  Resp: (!) 22  Temp: 98.3 F (36.8 C)  SpO2: 100%   General: Awake, alert, oriented. CV:  Good peripheral perfusion. Regular rate and rhythm. Resp:  Normal effort. Lungs clear. Abd:  No distention.    ED Results / Procedures / Treatments   Labs (all labs ordered are listed, but only abnormal results are displayed) Labs Reviewed  CBC - Abnormal; Notable for the following components:      Result Value   RBC 5.47 (*)    All other components  within normal limits  CBG MONITORING, ED - Abnormal; Notable for the following components:   Glucose-Capillary 125 (*)    All other components within normal limits  DIFFERENTIAL  PROTIME-INR  APTT  COMPREHENSIVE METABOLIC PANEL  ETHANOL  I-STAT CREATININE, ED  POC URINE PREG, ED  TROPONIN I (HIGH SENSITIVITY)     EKG  I, Phineas Semen, attending physician, personally viewed and interpreted this EKG  EKG Time: 1233 Rate: 100 Rhythm: normal sinus rhythm Axis: normal Intervals: qtc 454 QRS: narrow, LVH ST changes: no st elevation Impression: abnormal ekg   RADIOLOGY I independently interpreted and visualized the CT head. My interpretation: No ICH Radiology interpretation:  IMPRESSION:  No acute intracranial findings.     PROCEDURES:  Critical Care performed: No   MEDICATIONS ORDERED IN ED: Medications  sodium chloride flush (NS) 0.9 % injection 3 mL (3 mLs Intravenous Given 08/30/23 1246)     IMPRESSION / MDM / ASSESSMENT AND PLAN / ED COURSE  I reviewed the triage vital signs  and the nursing notes.                              Differential diagnosis includes, but is not limited to, anemia, dehydration, electrolyte abnormality, ICH  Patient's presentation is most consistent with acute presentation with potential threat to life or bodily function.   The patient is on the cardiac monitor to evaluate for evidence of arrhythmia and/or significant heart rate changes.  Patient presented to the emergency department today because of concerns for near syncope, chest pressure, dizziness.  The time my exam patient is feeling somewhat better but still has the chest pressure.  Blood pressure noted to be significantly elevated.  Will get head CT and check blood work.  Additionally will give medication to try to help bring down blood pressure.  CT head without any concerning bleed.  Patient's blood pressure did improve after medication.  Troponin of x 2.  Did have a  discussion with the patient.  Blood pressure is still elevated.  However she stated she would like to go home.  This time I think this is not unreasonable given that troponin was negative x 2 and head CT was normal.  I did offer however further blood pressure control here in the emergency department as well as possible admission for hypertensive urgency.  She did however declined and states she felt comfortable going home.  I did encourage her to try to follow-up with her doctor tomorrow which she thinks will be possible.  Additionally recommended return to the emergency department for any worsening symptoms.      FINAL CLINICAL IMPRESSION(S) / ED DIAGNOSES   Final diagnoses:  Dizziness  Hypertension, unspecified type       Note:  This document was prepared using Dragon voice recognition software and may include unintentional dictation errors.    Phineas Semen, MD 08/30/23 [phone]

## 2023-08-30 NOTE — ED Notes (Signed)
Pt A&O x4, no obvious distress noted, respirations regular/unlabored. Pt verbalizes understanding of discharge instructions. Pt able to ambulate from ED independently.

## 2023-08-30 NOTE — Discharge Instructions (Signed)
Please seek medical attention for any high fevers, chest pain, shortness of breath, change in behavior, persistent vomiting, bloody stool or any other new or concerning symptoms.  

## 2023-08-30 NOTE — ED Triage Notes (Signed)
Pt reports having a headache, feeling dizzy and having vision changes "seeing squiggly lines" for approximately one hour. Pt states yesterday she felt "off like I was getting a cold" but no symptoms then. Pt without unilateral weakness of focal stroke symptoms in triage. BP 242/121 in triage.

## 2024-07-22 ENCOUNTER — Encounter: Payer: Self-pay | Admitting: Emergency Medicine

## 2024-07-22 ENCOUNTER — Emergency Department: Payer: Self-pay

## 2024-07-22 ENCOUNTER — Emergency Department
Admission: EM | Admit: 2024-07-22 | Discharge: 2024-07-22 | Disposition: A | Discharge status: Home / Self Care | Payer: Self-pay | Attending: Emergency Medicine | Admitting: Emergency Medicine

## 2024-07-22 ENCOUNTER — Other Ambulatory Visit: Payer: Self-pay

## 2024-07-22 DIAGNOSIS — R112 Nausea with vomiting, unspecified: Secondary | ICD-10-CM | POA: Insufficient documentation

## 2024-07-22 DIAGNOSIS — Z91148 Patient's other noncompliance with medication regimen for other reason: Secondary | ICD-10-CM | POA: Insufficient documentation

## 2024-07-22 DIAGNOSIS — R079 Chest pain, unspecified: Secondary | ICD-10-CM

## 2024-07-22 DIAGNOSIS — H538 Other visual disturbances: Secondary | ICD-10-CM | POA: Insufficient documentation

## 2024-07-22 DIAGNOSIS — R519 Headache, unspecified: Secondary | ICD-10-CM

## 2024-07-22 DIAGNOSIS — R1013 Epigastric pain: Secondary | ICD-10-CM | POA: Insufficient documentation

## 2024-07-22 DIAGNOSIS — M549 Dorsalgia, unspecified: Secondary | ICD-10-CM | POA: Insufficient documentation

## 2024-07-22 DIAGNOSIS — I2510 Atherosclerotic heart disease of native coronary artery without angina pectoris: Secondary | ICD-10-CM | POA: Insufficient documentation

## 2024-07-22 DIAGNOSIS — I10 Essential (primary) hypertension: Secondary | ICD-10-CM | POA: Insufficient documentation

## 2024-07-22 LAB — CBC
HCT: 42.3 % (ref 36.0–46.0)
Hemoglobin: 14.1 g/dL (ref 12.0–15.0)
MCH: 26.7 pg (ref 26.0–34.0)
MCHC: 33.3 g/dL (ref 30.0–36.0)
MCV: 80 fL (ref 80.0–100.0)
Platelets: 264 K/uL (ref 150–400)
RBC: 5.29 MIL/uL — ABNORMAL HIGH (ref 3.87–5.11)
RDW: 14 % (ref 11.5–15.5)
WBC: 9.4 K/uL (ref 4.0–10.5)
nRBC: 0 % (ref 0.0–0.2)

## 2024-07-22 LAB — LIPASE, BLOOD: Lipase: 28 U/L (ref 11–51)

## 2024-07-22 LAB — BASIC METABOLIC PANEL WITH GFR
Anion gap: 12 (ref 5–15)
BUN: 13 mg/dL (ref 6–20)
CO2: 24 mmol/L (ref 22–32)
Calcium: 9 mg/dL (ref 8.9–10.3)
Chloride: 101 mmol/L (ref 98–111)
Creatinine, Ser: 0.6 mg/dL (ref 0.44–1.00)
GFR, Estimated: >60 mL/min (ref >60)
Glucose, Bld: 100 mg/dL — ABNORMAL HIGH (ref 70–99)
Potassium: 3.3 mmol/L — ABNORMAL LOW (ref 3.5–5.1)
Sodium: 137 mmol/L (ref 135–145)

## 2024-07-22 LAB — HEPATIC FUNCTION PANEL
ALT: 18 U/L (ref 0–44)
AST: 18 U/L (ref 15–41)
Albumin: 3.9 g/dL (ref 3.5–5.0)
Alkaline Phosphatase: 56 U/L (ref 38–126)
Bilirubin, Direct: <0.1 mg/dL (ref 0.0–0.2)
Total Bilirubin: 0.8 mg/dL (ref 0.0–1.2)
Total Protein: 7.5 g/dL (ref 6.5–8.1)

## 2024-07-22 LAB — TROPONIN I (HIGH SENSITIVITY)
Troponin I (High Sensitivity): 10 ng/L (ref <18)
Troponin I (High Sensitivity): 11 ng/L (ref <18)

## 2024-07-22 LAB — HCG, QUANTITATIVE, PREGNANCY: hCG, Beta Chain, Quant, S: 1 m[IU]/mL (ref <5)

## 2024-07-22 MED ORDER — AMLODIPINE BESYLATE 10 MG PO TABS: 10.0000 mg | ORAL_TABLET | Freq: Every day | ORAL | 1 refills | Status: AC

## 2024-07-22 MED ORDER — LORAZEPAM 1 MG PO TABS
1.0000 mg | ORAL_TABLET | Freq: Once | ORAL | Status: AC
  Administered 2024-07-22: 1 mg via ORAL
  Filled 2024-07-22: qty 1

## 2024-07-22 MED ORDER — GADOBUTROL 1 MMOL/ML IV SOLN
7.0000 mL | Freq: Once | INTRAVENOUS | Status: AC | PRN
  Administered 2024-07-22: 7 mL via INTRAVENOUS

## 2024-07-22 MED ORDER — DIPHENHYDRAMINE HCL 50 MG/ML IJ SOLN
25.0000 mg | Freq: Once | INTRAMUSCULAR | Status: AC
  Administered 2024-07-22: 25 mg via INTRAVENOUS
  Filled 2024-07-22: qty 1

## 2024-07-22 MED ORDER — AMLODIPINE BESYLATE 5 MG PO TABS
10.0000 mg | ORAL_TABLET | Freq: Once | ORAL | Status: AC
  Administered 2024-07-22: 10 mg via ORAL
  Filled 2024-07-22: qty 2

## 2024-07-22 MED ORDER — IOHEXOL 350 MG/ML SOLN
125.0000 mL | Freq: Once | INTRAVENOUS | Status: AC | PRN
  Administered 2024-07-22: 125 mL via INTRAVENOUS

## 2024-07-22 MED ORDER — ACETAMINOPHEN 500 MG PO TABS
1000.0000 mg | ORAL_TABLET | Freq: Once | ORAL | Status: AC
  Administered 2024-07-22: 1000 mg via ORAL
  Filled 2024-07-22: qty 2

## 2024-07-22 MED ORDER — LABETALOL HCL 100 MG PO TABS
100.0000 mg | ORAL_TABLET | Freq: Once | ORAL | Status: DC
  Filled 2024-07-22: qty 1

## 2024-07-22 MED ORDER — MORPHINE SULFATE (PF) 4 MG/ML IV SOLN
4.0000 mg | Freq: Once | INTRAVENOUS | Status: AC
  Administered 2024-07-22: 4 mg via INTRAVENOUS
  Filled 2024-07-22: qty 1

## 2024-07-22 MED ORDER — PROCHLORPERAZINE EDISYLATE 10 MG/2ML IJ SOLN
10.0000 mg | Freq: Once | INTRAMUSCULAR | Status: AC
  Administered 2024-07-22: 10 mg via INTRAVENOUS
  Filled 2024-07-22: qty 2

## 2024-07-22 NOTE — ED Notes (Signed)
Pt returned from MRI, nadn

## 2024-07-22 NOTE — ED Provider Notes (Signed)
 [facility] Provider Note    Event Date/Time   First MD Initiated Contact with Patient 07/22/24 1621     (approximate)   History   Chest Pain and Hypertension   HPI  Brenda Todd is a 43 y.o. female with history of hypertension, prior CVA, prior CAD, presenting with headache, chest pain, epigastric abdominal pain, vision changes to the left lateral eye.  Patient states that the headache started today, feels like similar headaches that she has had in the past where her blood pressure has been high.  Has been noting intermittent spots to her bilateral eyes.  Also noted for slightly more than a week that her left lateral vision is blurry.  No flashes of light, no curtains falling, no increased floaters.  She denies diplopia.  No syncopal episodes.  Also states that she has some chest pressure that started today, states that she also feels some back aching, also with epigastric abdominal pain and nausea vomiting.  She denies any diarrhea, did say that she is having decreased urine output despite drinking a lot of water.  States that her presentation for prior stroke was left-sided deficits with left-sided vision changes.  Those deficits had resolved after her stroke.  Did run out of her medications including her amlodipine  slightly more than a month ago and has not been able to get it refilled.   On independent chart review, she was admitted in March due to left-sided weakness, MRI showed an 11 mm acute infarct in the posterior limb right internal capsule.  CT angio at that time did not show a significant occlusion.      Physical Exam   Triage Vital Signs: ED Triage Vitals  Encounter Vitals Group     BP 07/22/24 1615 (!) 219/140     Girls Systolic BP Percentile --      Girls Diastolic BP Percentile --      Boys Systolic BP Percentile --      Boys Diastolic BP Percentile --      Pulse Rate 07/22/24 1615 93     Resp 07/22/24 1615 18     Temp 07/22/24 1615  97.8 F (36.6 C)     Temp Source 07/22/24 1615 Oral     SpO2 07/22/24 1615 100 %     Weight 07/22/24 1613 165 lb (74.8 kg)     Height 07/22/24 1613 5' 3 (1.6 m)     Head Circumference --      Peak Flow --      Pain Score 07/22/24 1611 8     Pain Loc --      Pain Education --      Exclude from Growth Chart --     Most recent vital signs: Vitals:   07/22/24 2006 07/22/24 2030  BP:  (!) 199/107  Pulse:  73  Resp:  18  Temp: 97.7 F (36.5 C)   SpO2:  100%     General: Awake, no distress.  CV:  Good peripheral perfusion.  Resp:  Normal effort.  No tachypnea or respiratory distress Abd:  No distention.  Soft, tender to the epigastric region Other:  Pupils are equal active, extraocular movements are intact, she has left lateral blurry vision, no other cranial nerve deficits, no focal weakness or numbness, no dysmetria.  She has equal DP pulses bilaterally.  No nuchal rigidity.  She is nontoxic-appearing.   ED Results / Procedures / Treatments   Labs (all labs ordered are  listed, but only abnormal results are displayed) Labs Reviewed  BASIC METABOLIC PANEL WITH GFR - Abnormal; Notable for the following components:      Result Value   Potassium 3.3 (*)    Glucose, Bld 100 (*)    All other components within normal limits  CBC - Abnormal; Notable for the following components:   RBC 5.29 (*)    All other components within normal limits  HEPATIC FUNCTION PANEL  LIPASE, BLOOD  HCG, QUANTITATIVE, PREGNANCY  TROPONIN I (HIGH SENSITIVITY)  TROPONIN I (HIGH SENSITIVITY)     EKG  EKG shows, sinus rhythm, rate 94, normal QS, normal QTc, no obvious ischemic ST elevation, not significantly compared to prior   RADIOLOGY On my independent interpretation, CT without obvious intracranial hemorrhage   PROCEDURES:  Critical Care performed: No  Procedures   MEDICATIONS ORDERED IN ED: Medications  amLODipine  (NORVASC ) tablet 10 mg (has no administration in time range)   prochlorperazine  (COMPAZINE ) injection 10 mg (10 mg Intravenous Given 07/22/24 1644)  diphenhydrAMINE  (BENADRYL ) injection 25 mg (25 mg Intravenous Given 07/22/24 1645)  acetaminophen  (TYLENOL ) tablet 1,000 mg (1,000 mg Oral Given 07/22/24 1644)  morphine  (PF) 4 MG/ML injection 4 mg (4 mg Intravenous Given 07/22/24 1653)  iohexol  (OMNIPAQUE ) 350 MG/ML injection 125 mL (125 mLs Intravenous Contrast Given 07/22/24 1819)  LORazepam  (ATIVAN ) tablet 1 mg (1 mg Oral Given 07/22/24 1837)  gadobutrol  (GADAVIST ) 1 MMOL/ML injection 7 mL (7 mLs Intravenous Contrast Given 07/22/24 1930)     IMPRESSION / MDM / ASSESSMENT AND PLAN / ED COURSE  I reviewed the triage vital signs and the nursing notes.                              Differential diagnosis includes, but is not limited to, CVA, hypertensive emergency, ACS, angina, dissection, GERD, acid reflux, electrolyte derangements, mass, migraine, tension headache.  Rule out was not activated given that her left lateral blurry vision has been ongoing for more than a week and she is outside tPA window.  Will get labs, EKG, troponin, CT dissection protocol, CT angio head and neck, will give her migraine cocktail, some IV morphine  for the pain.  She may need some antihypertensives.  Reassess. She will likely need an MRI.  Patient's presentation is most consistent with acute presentation with potential threat to life or bodily function.  Independent interpretation of labs and imaging below.  Bedside ocular ultrasound of her left eye without evidence of retinal or vitreous detachment, no evidence of vitreous hemorrhage.  On reassessment patient's blood pressures are improving, symptoms have improved, will give her a dose of amlodipine  here and plan to have her follow-up outpatient with ophthalmology, cardiology as well as primary care.  Put in those referrals as well as numbers to call.  Will give her a prescription for amlodipine .  Otherwise considered but no indication  for inpatient admission at this time, she is safe for outpatient management.  She is well-appearing.  She has decision-making to patient and she is agreeable with this plan.  Discharged with strict return precautions.  The patient is on the cardiac monitor to evaluate for evidence of arrhythmia and/or significant heart rate changes.   Clinical Course as of 07/22/24 2130  Sun Jul 22, 2024  1729 On reassessment blood pressure now 190 systolic.  Patient states that with interventions her pain is improving. [TT]  1842 Independent review of labs, no leukocytosis, electrolytes not severely  deranged, no AKI, LFTs are normal, troponin, hCG, lipase is normal. [TT]  1920 CT Angio Chest/Abd/Pel for Dissection W and/or W/WO IMPRESSION: CHEST:  1. No evidence of thoracic aortic dissection or aneurysm. 2. No acute pulmonary embolism. 3. Bilateral hilar lymphadenopathy. 4. Bilateral small new pulmonary nodules. 5. Differential for new pulmonary nodules and lymphadenopathy include infectious or inflammatory process versus malignancy. Recommend either FDG PET scan for evaluation of malignant potential or follow-up CT chest with contrast in 1-3 months.  PELVIS:  1. No evidence of abdominal aortic dissection or aneurysm. 2. No acute findings in the abdomen pelvis. 3. Post appendectomy.   [TT]  1920 CT Angio Head Neck W WO CM IMPRESSION: 1. No evidence of acute intracranial abnormality. 2. Chronic lacunar infarct in the right internal capsule. 3. Minimal atherosclerosis without a large vessel occlusion or significant proximal stenosis in the head or neck.   [TT]  1946 Troponin I (High Sensitivity): 11 Neg x2 [TT]  1954 MR Brain W and Wo Contrast IMPRESSION: 1. No acute intracranial abnormality. 2. Chronic small vessel ischemic disease with chronic lacunar infarcts as above. 3. Unremarkable appearance of the orbits.   [TT]  1955 MR ORBITS W WO CONTRAST IMPRESSION: 1. No acute intracranial  abnormality. 2. Chronic small vessel ischemic disease with chronic lacunar infarcts as above. 3. Unremarkable appearance of the orbits.   [TT]    Clinical Course User Index [TT] Waymond Lorelle Cummins, MD     FINAL CLINICAL IMPRESSION(S) / ED DIAGNOSES   Final diagnoses:  Chest pain, unspecified type  Acute nonintractable headache, unspecified headache type  Epigastric pain  Acute back pain, unspecified back location, unspecified back pain laterality  Nausea and vomiting, unspecified vomiting type  Noncompliance with medication regimen  Blurry vision, left eye  Hypertension, unspecified type     Rx / DC Orders   ED Discharge Orders          Ordered    amLODipine  (NORVASC ) 10 MG tablet  Daily        07/22/24 2129    Ambulatory referral to Cardiology       Comments: If you have not heard from the Cardiology office within the next 72 hours please call [phone].   07/22/24 2130    Ambulatory Referral to Primary Care (Establish Care)        07/22/24 2130             Note:  This document was prepared using Dragon voice recognition software and may include unintentional dictation errors.    Waymond Lorelle Cummins, MD 07/22/24 2130

## 2024-07-22 NOTE — ED Notes (Signed)
Pt at mri

## 2024-07-22 NOTE — Discharge Instructions (Signed)
 Please take the medications as prescribed for your high blood pressure.  I have also put in several follow-ups for you.  Please make sure to follow-up with cardiology, primary care and ophthalmology.

## 2024-07-22 NOTE — ED Triage Notes (Signed)
 Pt via POV from home. Pt c/o chest pressure and seeing dots. For the past couple of days. Report that she has a hx of HTN, ran out of medication 1 month ago. Reports that she has hx of MI and stroke. Denies blood thinners. Pt is A&Ox4 and NAD, ambulatory to triage with steady gait.

## 2024-08-09 ENCOUNTER — Emergency Department
Admission: EM | Admit: 2024-08-09 | Discharge: 2024-08-09 | Disposition: A | Discharge status: Home / Self Care | Payer: Self-pay | Attending: Emergency Medicine | Admitting: Emergency Medicine

## 2024-08-09 ENCOUNTER — Emergency Department: Payer: Self-pay

## 2024-08-09 ENCOUNTER — Encounter: Payer: Self-pay | Admitting: Emergency Medicine

## 2024-08-09 ENCOUNTER — Other Ambulatory Visit: Payer: Self-pay

## 2024-08-09 DIAGNOSIS — K0889 Other specified disorders of teeth and supporting structures: Secondary | ICD-10-CM

## 2024-08-09 DIAGNOSIS — I2510 Atherosclerotic heart disease of native coronary artery without angina pectoris: Secondary | ICD-10-CM | POA: Insufficient documentation

## 2024-08-09 DIAGNOSIS — I10 Essential (primary) hypertension: Secondary | ICD-10-CM | POA: Insufficient documentation

## 2024-08-09 DIAGNOSIS — K047 Periapical abscess without sinus: Secondary | ICD-10-CM | POA: Insufficient documentation

## 2024-08-09 LAB — CBC WITH DIFFERENTIAL/PLATELET
Abs Immature Granulocytes: 0.03 K/uL (ref 0.00–0.07)
Basophils Absolute: 0.1 K/uL (ref 0.0–0.1)
Basophils Relative: 1 %
Eosinophils Absolute: 0.1 K/uL (ref 0.0–0.5)
Eosinophils Relative: 1 %
HCT: 44.9 % (ref 36.0–46.0)
Hemoglobin: 14.8 g/dL (ref 12.0–15.0)
Immature Granulocytes: 0 %
Lymphocytes Relative: 16 %
Lymphs Abs: 1.4 K/uL (ref 0.7–4.0)
MCH: 26.2 pg (ref 26.0–34.0)
MCHC: 33 g/dL (ref 30.0–36.0)
MCV: 79.6 fL — ABNORMAL LOW (ref 80.0–100.0)
Monocytes Absolute: 0.6 K/uL (ref 0.1–1.0)
Monocytes Relative: 7 %
Neutro Abs: 6.6 K/uL (ref 1.7–7.7)
Neutrophils Relative %: 75 %
Platelets: 294 K/uL (ref 150–400)
RBC: 5.64 MIL/uL — ABNORMAL HIGH (ref 3.87–5.11)
RDW: 14 % (ref 11.5–15.5)
WBC: 8.7 K/uL (ref 4.0–10.5)
nRBC: 0 % (ref 0.0–0.2)

## 2024-08-09 LAB — MAGNESIUM: Magnesium: 2.2 mg/dL (ref 1.7–2.4)

## 2024-08-09 LAB — BASIC METABOLIC PANEL WITH GFR
Anion gap: 11 (ref 5–15)
BUN: 12 mg/dL (ref 6–20)
CO2: 26 mmol/L (ref 22–32)
Calcium: 8.9 mg/dL (ref 8.9–10.3)
Chloride: 100 mmol/L (ref 98–111)
Creatinine, Ser: 0.86 mg/dL (ref 0.44–1.00)
GFR, Estimated: >60 mL/min (ref >60)
Glucose, Bld: 119 mg/dL — ABNORMAL HIGH (ref 70–99)
Potassium: 3.1 mmol/L — ABNORMAL LOW (ref 3.5–5.1)
Sodium: 137 mmol/L (ref 135–145)

## 2024-08-09 LAB — TROPONIN I (HIGH SENSITIVITY): Troponin I (High Sensitivity): 11 ng/L (ref <18)

## 2024-08-09 MED ORDER — POTASSIUM CHLORIDE CRYS ER 20 MEQ PO TBCR
40.0000 meq | EXTENDED_RELEASE_TABLET | Freq: Once | ORAL | Status: AC
  Administered 2024-08-09: 40 meq via ORAL
  Filled 2024-08-09: qty 2

## 2024-08-09 MED ORDER — ACETAMINOPHEN 500 MG PO TABS
1000.0000 mg | ORAL_TABLET | Freq: Once | ORAL | Status: AC
  Administered 2024-08-09: 1000 mg via ORAL
  Filled 2024-08-09: qty 2

## 2024-08-09 MED ORDER — KETOROLAC TROMETHAMINE 30 MG/ML IJ SOLN
15.0000 mg | Freq: Once | INTRAMUSCULAR | Status: AC
  Administered 2024-08-09: 15 mg via INTRAVENOUS
  Filled 2024-08-09: qty 1

## 2024-08-09 MED ORDER — AMLODIPINE BESYLATE 5 MG PO TABS
10.0000 mg | ORAL_TABLET | Freq: Once | ORAL | Status: AC
  Administered 2024-08-09: 10 mg via ORAL
  Filled 2024-08-09: qty 2

## 2024-08-09 MED ORDER — HYDROMORPHONE HCL 1 MG/ML IJ SOLN
1.0000 mg | Freq: Once | INTRAMUSCULAR | Status: AC
  Administered 2024-08-09: 1 mg via INTRAVENOUS
  Filled 2024-08-09: qty 1

## 2024-08-09 MED ORDER — OXYCODONE HCL 5 MG PO TABS
5.0000 mg | ORAL_TABLET | Freq: Three times a day (TID) | ORAL | 0 refills | Status: AC | PRN
Start: 2024-08-09 — End: 2025-08-09

## 2024-08-09 MED ORDER — CLONIDINE HCL 0.1 MG PO TABS
0.1000 mg | ORAL_TABLET | Freq: Once | ORAL | Status: AC
  Administered 2024-08-09: 0.1 mg via ORAL
  Filled 2024-08-09: qty 1

## 2024-08-09 MED ORDER — CLINDAMYCIN HCL 150 MG PO CAPS: 450.0000 mg | ORAL_CAPSULE | Freq: Three times a day (TID) | ORAL | 0 refills | Status: AC

## 2024-08-09 MED ORDER — CLINDAMYCIN HCL 150 MG PO CAPS
450.0000 mg | ORAL_CAPSULE | Freq: Once | ORAL | Status: AC
  Administered 2024-08-09: 450 mg via ORAL
  Filled 2024-08-09: qty 3

## 2024-08-09 MED ORDER — IOHEXOL 300 MG/ML  SOLN
75.0000 mL | Freq: Once | INTRAMUSCULAR | Status: AC | PRN
  Administered 2024-08-09: 75 mL via INTRAVENOUS

## 2024-08-09 MED ORDER — OXYCODONE HCL 5 MG PO TABS
5.0000 mg | ORAL_TABLET | Freq: Once | ORAL | Status: AC
  Administered 2024-08-09: 5 mg via ORAL
  Filled 2024-08-09: qty 1

## 2024-08-09 NOTE — ED Triage Notes (Signed)
 Pt reports right sided jaw pain, ear pain, chest pain, fever, and emesis. Pt reports she has been unable to take her BP medication.

## 2024-08-09 NOTE — ED Provider Notes (Addendum)
 [facility] Provider Note    Event Date/Time   First MD Initiated Contact with Patient 08/09/24 1810     (approximate)   History   Facial Swelling   HPI  Brenda Todd is a 43 y.o. female who presents to the ED for evaluation of Facial Swelling   Review of medical DC summary from last year.  History of HTN, CAD s/p stenting, acute ischemic stroke last year, nephrolithiasis  Patient presents for about 1 day of severe atraumatic right mandibular dental pain, subjective chills and fevers   Physical Exam   Triage Vital Signs: ED Triage Vitals [08/09/24 1527]  Encounter Vitals Group     BP (!) 242/121     Girls Systolic BP Percentile      Girls Diastolic BP Percentile      Boys Systolic BP Percentile      Boys Diastolic BP Percentile      Pulse Rate (!) 112     Resp 18     Temp 98.4 F (36.9 C)     Temp Source Oral     SpO2 100 %     Weight      Height      Head Circumference      Peak Flow      Pain Score 10     Pain Loc      Pain Education      Exclude from Growth Chart     Most recent vital signs: Vitals:   08/09/24 2030 08/09/24 2100  BP: (!) 226/118 (!) 207/112  Pulse: (!) 101 85  Resp: 20 18  Temp:    SpO2: 100% 100%    General: Awake, no distress.  Clearly uncomfortable but no distress. CV:  Good peripheral perfusion.  Resp:  Normal effort.  Abd:  No distention.  MSK:  No deformity noted.  Neuro:  No focal deficits appreciated. Other:  Tenderness over the right mandible and jawline without signs of external abscess, cellulitis or trauma. No firmness going down the neck, no signs of Ludwig's angina No signs of upper airway obstruction, uvula midline   ED Results / Procedures / Treatments   Labs (all labs ordered are listed, but only abnormal results are displayed) Labs Reviewed  CBC WITH DIFFERENTIAL/PLATELET - Abnormal; Notable for the following components:      Result Value   RBC 5.64 (*)    MCV 79.6 (*)     All other components within normal limits  BASIC METABOLIC PANEL WITH GFR - Abnormal; Notable for the following components:   Potassium 3.1 (*)    Glucose, Bld 119 (*)    All other components within normal limits  MAGNESIUM   TROPONIN I (HIGH SENSITIVITY)    EKG Sinus rhythm with a rate of 103 bpm.  Normal axis and intervals without clear signs of acute ischemia.  Signs of LVH  RADIOLOGY CT head interpreted by me without evidence of acute intracranial pathology CT maxillofacial interpreted by me with carious dentition without periapical abscess  Official radiology report(s): CT Maxillofacial W Contrast Result Date: 08/09/2024 EXAM: CT Face with contrast 08/09/2024 07:31:04 PM TECHNIQUE: CT of the face was performed with the administration of intravenous contrast. Multiplanar reformatted images are provided for review. Automated exposure control, iterative reconstruction, and/or weight based adjustment of the mA/kV was utilized to reduce the radiation dose to as low as reasonably achievable. COMPARISON: None available CLINICAL HISTORY: Right mandibular molar severe pain and swelling, eval periodontal abscess. Pt  sent to ER for eval of HTN and toothache. Pt has swelling to right side of face. Pt reports having n/v and unable to keep bp meds down. Pt taking motrin  without relief. Pt alert. No resp distress. FINDINGS: AERODIGESTIVE TRACT: No mass. No edema. SALIVARY GLANDS: No acute abnormality. LYMPH NODES: Reactive right submandibular lymph nodes. SOFT TISSUES: No abscess or drainable fluid collection. BRAIN, ORBITS AND SINUSES: No acute abnormality. BONES: Dental caries and periapical lucencies of the remaining right mandibular molars. There are similar findings of the left mandibular molars as well as multiple dental caries throughout the oral cavity. Non-erupted left maxillary third molar. IMPRESSION: 1. Dental caries and periapical lucencies of the remaining right mandibular molars, which may be a  source of pain. No abscess or drainable fluid collection. 2. Similar findings of the left mandibular molars, and multiple dental caries throughout the oral cavity. 3. Reactive right submandibular lymph nodes. Electronically signed by: Franky Stanford MD 08/09/2024 08:02 PM EDT RP Workstation: [id number]   CT HEAD WO CONTRAST ( ) Result Date: 08/09/2024 CLINICAL DATA:  severe headache, HTN. eval ich EXAM: CT HEAD WITHOUT CONTRAST TECHNIQUE: Contiguous axial images were obtained from the base of the skull through the vertex without intravenous contrast. RADIATION DOSE REDUCTION: This exam was performed according to the departmental dose-optimization program which includes automated exposure control, adjustment of the mA and/or kV according to patient size and/or use of iterative reconstruction technique. COMPARISON:  None Available. FINDINGS: Brain: Normal anatomic configuration. No abnormal intra or extra-axial mass lesion or fluid collection. No abnormal mass effect or midline shift. No evidence of acute intracranial hemorrhage or infarct. Ventricular size is normal. Cerebellum unremarkable. Vascular: Unremarkable Skull: Intact Sinuses/Orbits: Paranasal sinuses are clear. Orbits are unremarkable. Other: Mastoid air cells and middle ear cavities are clear. IMPRESSION: 1. No acute intracranial abnormality. Electronically Signed   By: Dorethia Molt M.D.   On: 08/09/2024 19:41    PROCEDURES and INTERVENTIONS:  Procedures  Medications  HYDROmorphone  (DILAUDID ) injection 1 mg (1 mg Intravenous Given 08/09/24 1919)  ketorolac  (TORADOL ) 30 MG/ML injection 15 mg (15 mg Intravenous Given 08/09/24 1918)  iohexol  (OMNIPAQUE ) 300 MG/ML solution 75 mL (75 mLs Intravenous Contrast Given 08/09/24 1923)  clindamycin  (CLEOCIN ) capsule 450 mg (450 mg Oral Given 08/09/24 2026)  acetaminophen  (TYLENOL ) tablet 1,000 mg (1,000 mg Oral Given 08/09/24 2026)  oxyCODONE  (Oxy IR/ROXICODONE ) immediate release tablet 5 mg (5 mg Oral Given  08/09/24 2026)  potassium chloride  SA (KLOR-CON  M) CR tablet 40 mEq (40 mEq Oral Given 08/09/24 2026)  amLODipine  (NORVASC ) tablet 10 mg (10 mg Oral Given 08/09/24 2058)  cloNIDine  (CATAPRES ) tablet 0.1 mg (0.1 mg Oral Given 08/09/24 2058)     IMPRESSION / MDM / ASSESSMENT AND PLAN / ED COURSE  I reviewed the triage vital signs and the nursing notes.  Differential diagnosis includes, but is not limited to, apical abscess, dental infection, upper airway obstruction, PTA, Ludwig's angina, sepsis  {Patient presents with symptoms of an acute illness or injury that is potentially life-threatening.  Patient presents with about 1 day of severe atraumatic right mandibular dental pain without evidence of abscess and suitable for outpatient management with antibiotics, analgesia and dental follow-up.  Quite hypertensive, improving with analgesia and restarting her home BP medications.  Presenting tachycardia is noted and likely related to pain, no other SIRS criteria, normal WBC.  Mild hypokalemia is replaced orally.  No evidence of neurologic deficits, CT head without ICH, CT maxillofacial without abscess.  Started on clindamycin , discharged  with the same and patient is suitable for outpatient management.  Clinical Course as of 08/09/24 2119  Thu Aug 09, 2024  1909 24-hour severe right mandibular molar dental pain, swelling, subjective fever/chills [DS]  2030 Clinda 450 every 8 [DS]  2040 Reassessed.  Pain significantly improved patient is appreciative, discussed CT results and plan of care.   [DS]    Clinical Course User Index [DS] Claudene Rover, MD     FINAL CLINICAL IMPRESSION(S) / ED DIAGNOSES   Final diagnoses:  Dental infection  Pain, dental     Rx / DC Orders   ED Discharge Orders          Ordered    clindamycin  (CLEOCIN ) 150 MG capsule  3 times daily        08/09/24 2042    oxyCODONE  (ROXICODONE ) 5 MG immediate release tablet  Every 8 hours PRN        08/09/24 2042              Note:  This document was prepared using Dragon voice recognition software and may include unintentional dictation errors.   Claudene Rover, MD 08/09/24 2045    Claudene Rover, MD 08/09/24 2119

## 2024-08-09 NOTE — Discharge Instructions (Signed)
 Please take Tylenol  and ibuprofen /Advil  for your pain.  It is safe to take them together, or to alternate them every few hours.  Take up to 1000mg  of Tylenol  at a time, up to 4 times per day.  Do not take more than 4000 mg of Tylenol  in 24 hours.  For ibuprofen , take 400-600 mg, 3 - 4 times per day.  Oxycodone  as needed for more severe/breakthrough pain  Clindamycin  antibiotics 3 times daily for 1 week for dental infection

## 2024-08-09 NOTE — ED Notes (Signed)
 Pt sent to er for eval of HTN and toothache.  Pt has swelling to right side of face.  Pt reports having n/v and unable to keep bp meds down.  So today pt saw emergency dentist and was sent to er for eval of htn.  Pt taking motrin  without relief.  Pt alert.   No resp distress.
